# Patient Record
Sex: Female | Born: 1960 | Race: White | Hispanic: No | Marital: Married | Smoking: Former smoker
Health system: Southern US, Community
[De-identification: ages and names within clinical notes are randomized; demographics above are authoritative.]

## PROBLEM LIST (undated history)

## (undated) DIAGNOSIS — G8929 Other chronic pain: Secondary | ICD-10-CM

## (undated) DIAGNOSIS — I10 Essential (primary) hypertension: Secondary | ICD-10-CM

## (undated) DIAGNOSIS — M545 Low back pain, unspecified: Secondary | ICD-10-CM

## (undated) DIAGNOSIS — E785 Hyperlipidemia, unspecified: Secondary | ICD-10-CM

## (undated) DIAGNOSIS — M1990 Unspecified osteoarthritis, unspecified site: Secondary | ICD-10-CM

## (undated) DIAGNOSIS — I2510 Atherosclerotic heart disease of native coronary artery without angina pectoris: Secondary | ICD-10-CM

## (undated) DIAGNOSIS — E079 Disorder of thyroid, unspecified: Secondary | ICD-10-CM

## (undated) DIAGNOSIS — E559 Vitamin D deficiency, unspecified: Secondary | ICD-10-CM

## (undated) DIAGNOSIS — R51 Headache: Secondary | ICD-10-CM

## (undated) DIAGNOSIS — G4700 Insomnia, unspecified: Secondary | ICD-10-CM

## (undated) DIAGNOSIS — I272 Pulmonary hypertension, unspecified: Secondary | ICD-10-CM

## (undated) DIAGNOSIS — K219 Gastro-esophageal reflux disease without esophagitis: Secondary | ICD-10-CM

## (undated) DIAGNOSIS — R519 Headache, unspecified: Secondary | ICD-10-CM

## (undated) HISTORY — DX: Atherosclerotic heart disease of native coronary artery without angina pectoris: I25.10

## (undated) HISTORY — DX: Vitamin D deficiency, unspecified: E55.9

## (undated) HISTORY — DX: Headache, unspecified: R51.9

## (undated) HISTORY — DX: Other chronic pain: G89.29

## (undated) HISTORY — DX: Low back pain, unspecified: M54.50

## (undated) HISTORY — PX: CORONARY ANGIOPLASTY WITH STENT PLACEMENT: SHX49

## (undated) HISTORY — DX: Insomnia, unspecified: G47.00

## (undated) HISTORY — DX: Disorder of thyroid, unspecified: E07.9

## (undated) HISTORY — DX: Headache: R51

## (undated) HISTORY — DX: Pulmonary hypertension, unspecified: I27.20

## (undated) HISTORY — DX: Low back pain: M54.5

---

## 1998-09-22 HISTORY — PX: BILATERAL CARPAL TUNNEL RELEASE: SHX6508

## 1999-05-15 ENCOUNTER — Ambulatory Visit (HOSPITAL_COMMUNITY): Admission: RE | Admit: 1999-05-15 | Discharge: 1999-05-15 | Payer: Self-pay | Admitting: Family Medicine

## 1999-05-15 ENCOUNTER — Encounter: Payer: Self-pay | Admitting: Family Medicine

## 2001-04-14 ENCOUNTER — Other Ambulatory Visit: Admission: RE | Admit: 2001-04-14 | Discharge: 2001-04-14 | Payer: Self-pay | Admitting: Obstetrics and Gynecology

## 2001-04-23 ENCOUNTER — Encounter: Payer: Self-pay | Admitting: Obstetrics and Gynecology

## 2001-04-23 ENCOUNTER — Ambulatory Visit (HOSPITAL_COMMUNITY): Admission: RE | Admit: 2001-04-23 | Discharge: 2001-04-23 | Payer: Self-pay | Admitting: Obstetrics and Gynecology

## 2001-06-16 ENCOUNTER — Inpatient Hospital Stay (HOSPITAL_COMMUNITY): Admission: RE | Admit: 2001-06-16 | Discharge: 2001-06-18 | Payer: Self-pay | Admitting: Obstetrics and Gynecology

## 2001-06-16 ENCOUNTER — Encounter (INDEPENDENT_AMBULATORY_CARE_PROVIDER_SITE_OTHER): Payer: Self-pay

## 2004-10-18 ENCOUNTER — Inpatient Hospital Stay: Payer: Self-pay | Admitting: Cardiovascular Disease

## 2004-10-18 ENCOUNTER — Observation Stay (HOSPITAL_COMMUNITY): Admission: AD | Admit: 2004-10-18 | Discharge: 2004-10-19 | Payer: Self-pay | Admitting: Cardiology

## 2004-11-19 ENCOUNTER — Encounter: Payer: Self-pay | Admitting: Cardiovascular Disease

## 2004-12-25 ENCOUNTER — Inpatient Hospital Stay (HOSPITAL_COMMUNITY): Admission: EM | Admit: 2004-12-25 | Discharge: 2004-12-27 | Payer: Self-pay | Admitting: Emergency Medicine

## 2005-05-02 ENCOUNTER — Emergency Department (HOSPITAL_COMMUNITY): Admission: EM | Admit: 2005-05-02 | Discharge: 2005-05-03 | Payer: Self-pay | Admitting: Emergency Medicine

## 2007-01-07 ENCOUNTER — Ambulatory Visit (HOSPITAL_COMMUNITY): Admission: RE | Admit: 2007-01-07 | Discharge: 2007-01-08 | Payer: Self-pay | Admitting: Orthopedic Surgery

## 2007-02-21 ENCOUNTER — Emergency Department: Payer: Self-pay | Admitting: Unknown Physician Specialty

## 2007-04-02 ENCOUNTER — Ambulatory Visit: Payer: Self-pay | Admitting: Cardiovascular Disease

## 2007-04-08 ENCOUNTER — Other Ambulatory Visit: Payer: Self-pay

## 2007-04-08 ENCOUNTER — Inpatient Hospital Stay: Payer: Self-pay | Admitting: Cardiovascular Disease

## 2007-04-09 ENCOUNTER — Other Ambulatory Visit: Payer: Self-pay

## 2007-04-10 ENCOUNTER — Other Ambulatory Visit: Payer: Self-pay

## 2007-11-01 ENCOUNTER — Other Ambulatory Visit: Payer: Self-pay

## 2007-11-01 ENCOUNTER — Observation Stay: Payer: Self-pay | Admitting: Internal Medicine

## 2007-11-02 ENCOUNTER — Other Ambulatory Visit: Payer: Self-pay

## 2009-05-15 ENCOUNTER — Ambulatory Visit: Payer: Self-pay | Admitting: Cardiovascular Disease

## 2009-08-06 ENCOUNTER — Inpatient Hospital Stay (HOSPITAL_COMMUNITY): Admission: EM | Admit: 2009-08-06 | Discharge: 2009-08-07 | Payer: Self-pay | Admitting: Emergency Medicine

## 2010-06-11 ENCOUNTER — Ambulatory Visit: Payer: Self-pay | Admitting: Internal Medicine

## 2010-06-21 ENCOUNTER — Ambulatory Visit: Payer: Self-pay | Admitting: Emergency Medicine

## 2010-06-24 ENCOUNTER — Ambulatory Visit: Payer: Self-pay | Admitting: Emergency Medicine

## 2010-06-24 HISTORY — PX: BREAST EXCISIONAL BIOPSY: SUR124

## 2010-06-27 LAB — PATHOLOGY REPORT

## 2010-11-10 ENCOUNTER — Encounter: Payer: Self-pay | Admitting: Family Medicine

## 2010-11-10 ENCOUNTER — Ambulatory Visit (INDEPENDENT_AMBULATORY_CARE_PROVIDER_SITE_OTHER): Payer: BC Managed Care – PPO | Admitting: Family Medicine

## 2010-11-10 ENCOUNTER — Encounter (INDEPENDENT_AMBULATORY_CARE_PROVIDER_SITE_OTHER): Payer: Self-pay | Admitting: *Deleted

## 2010-11-10 DIAGNOSIS — M76899 Other specified enthesopathies of unspecified lower limb, excluding foot: Secondary | ICD-10-CM

## 2010-11-10 DIAGNOSIS — N390 Urinary tract infection, site not specified: Secondary | ICD-10-CM

## 2010-11-10 LAB — CONVERTED CEMR LAB
Bilirubin Urine: NEGATIVE
Glucose, Urine, Semiquant: NEGATIVE
Ketones, urine, test strip: NEGATIVE
WBC Urine, dipstick: NEGATIVE
pH: 7

## 2010-11-19 NOTE — Medication Information (Signed)
Summary: Tax adviser   Imported By: Dorna Leitz 11/11/2010 15:57:53  _____________________________________________________________________  External Attachment:    Type:   Image     Comment:   External Document

## 2010-11-19 NOTE — Progress Notes (Signed)
Summary: Office Visit  Office Visit   Imported By: Dorna Leitz 11/11/2010 15:56:45  _____________________________________________________________________  External Attachment:    Type:   Image     Comment:   External Document

## 2010-11-19 NOTE — Assessment & Plan Note (Signed)
Summary: BACK PAIN/EVW   Vital Signs:  Patient Profile:   50 Years Old Female CC:      Lower back pain Height:     62.75 inches Weight:      198 pounds Temp:     98.7 degrees F oral Pulse rate:   76 / minute Pulse rhythm:   regular Resp:     20 per minute BP sitting:   124 / 86  (right arm) Cuff size:   large  Vitals Entered By: Providence Crosby LPN (November 10, 2010 4:00 PM)                  Current Allergies: No known allergies History of Present Illness Chief Complaint: Lower back pain History of Present Illness: Awaken Thursday 11/07/2010 with lower back pain radiating to lower abdoman started drinking cranberry juice without any help. Complains of pressure of lower abd. with voiding  REVIEW OF SYSTEMS Constitutional Symptoms       Complains of fever, chills, and night sweats.     Denies weight loss, weight gain, and fatigue.  Eyes       Denies change in vision, eye pain, eye discharge, glasses, contact lenses, and eye surgery. Ear/Nose/Throat/Mouth       Complains of sinus problems.      Denies hearing loss/aids, change in hearing, ear pain, ear discharge, dizziness, frequent runny nose, frequent nose bleeds, sore throat, hoarseness, and tooth pain or bleeding.  Respiratory       Denies dry cough, productive cough, wheezing, shortness of breath, asthma, bronchitis, and emphysema/COPD.  Cardiovascular       Denies murmurs, chest pain, and tires easily with exhertion.    Gastrointestinal       Denies stomach pain, nausea/vomiting, diarrhea, constipation, blood in bowel movements, and indigestion. Genitourniary       Denies painful urination, kidney stones, and loss of urinary control.      Comments: lower back and lower abd. pain Neurological       Denies paralysis, seizures, and fainting/blackouts. Musculoskeletal       Denies muscle pain, joint pain, joint stiffness, decreased range of motion, redness, swelling, muscle weakness, and gout.  Skin       Denies  bruising, unusual mles/lumps or sores, and hair/skin or nail changes.  Psych       Denies mood changes, temper/anger issues, anxiety/stress, speech problems, depression, and sleep problems.  Family History: Father: deceased  Mother: alive with a history of cad Siblings:  2 brothers alive and well 1 sister alive and well Family History of CAD Female 1st degree relative <60 Family History High cholesterol  Social History: Married Assessment New Problems: LOWER BACK PAIN (ICD-724.2) BACK PAIN (ICD-724.5) URINARY TRACT INFECTION (ICD-599.0) FAMILY HISTORY OF CAD FEMALE 1ST DEGREE RELATIVE <60 (ICD-V16.49)   Plan New Orders: New Patient Level III [99203]  The patient and/or caregiver has been counseled thoroughly with regard to medications prescribed including dosage, schedule, interactions, rationale for use, and possible side effects and they verbalize understanding.  Diagnoses and expected course of recovery discussed and will return if not improved as expected or if the condition worsens. Patient and/or caregiver verbalized understanding.   Orders Added: 1)  New Patient Level III [99203]    Laboratory Results   Urine Tests  Date/Time Received: November 10, 2010 4:27 PM Date/Time Reported: November 10, 2010 4:27 PM  Routine Urinalysis   Color: yellow Appearance: Cloudy Glucose: negative   (Normal Range: Negative) Bilirubin: negative   (  Normal Range: Negative) Ketone: negative   (Normal Range: Negative) Spec. Gravity: 1.020   (Normal Range: 1.003-1.035) Blood: negative   (Normal Range: Negative) pH: 7.0   (Normal Range: 5.0-8.0) Protein: negative   (Normal Range: Negative) Urobilinogen: 0.2   (Normal Range: 0-1) Nitrite: negative   (Normal Range: Negative) Leukocyte Esterace: negative   (Normal Range: Negative)

## 2010-12-25 LAB — COMPREHENSIVE METABOLIC PANEL
ALT: 24 U/L (ref 0–35)
Albumin: 3.9 g/dL (ref 3.5–5.2)
Alkaline Phosphatase: 69 U/L (ref 39–117)
BUN: 8 mg/dL (ref 6–23)
CO2: 26 mEq/L (ref 19–32)
Calcium: 9.4 mg/dL (ref 8.4–10.5)
Chloride: 109 mEq/L (ref 96–112)
Creatinine, Ser: 0.65 mg/dL (ref 0.4–1.2)
GFR calc non Af Amer: >60 mL/min (ref >60)
Sodium: 143 mEq/L (ref 135–145)
Total Bilirubin: 0.5 mg/dL (ref 0.3–1.2)
Total Protein: 7 g/dL (ref 6.0–8.3)

## 2010-12-25 LAB — CBC
HCT: 36.6 % (ref 36.0–46.0)
Hemoglobin: 12 g/dL (ref 12.0–15.0)
MCV: 81.8 fL (ref 78.0–100.0)
MCV: 81.5 fL (ref 78.0–100.0)
RBC: 4.28 MIL/uL (ref 3.87–5.11)
RBC: 4.45 MIL/uL (ref 3.87–5.11)
WBC: 6.5 10*3/uL (ref 4.0–10.5)
WBC: 5.7 10*3/uL (ref 4.0–10.5)

## 2010-12-25 LAB — CARDIAC PANEL(CRET KIN+CKTOT+MB+TROPI)
CK, MB: 1.4 ng/mL (ref 0.3–4.0)
Troponin I: 0.01 ng/mL (ref 0.00–0.06)
Troponin I: 0.01 ng/mL (ref 0.00–0.06)
Troponin I: 0.1 ng/mL — ABNORMAL HIGH (ref 0.00–0.06)

## 2010-12-25 LAB — DIFFERENTIAL
Basophils Absolute: 0 10*3/uL (ref 0.0–0.1)
Basophils Relative: 1 % (ref 0–1)
Monocytes Absolute: 0.4 10*3/uL (ref 0.1–1.0)
Neutrophils Relative %: 51 % (ref 43–77)

## 2010-12-25 LAB — BASIC METABOLIC PANEL
Calcium: 9.2 mg/dL (ref 8.4–10.5)
Creatinine, Ser: 0.68 mg/dL (ref 0.4–1.2)
GFR calc non Af Amer: >60 mL/min (ref >60)
Potassium: 3.6 mEq/L (ref 3.5–5.1)

## 2010-12-25 LAB — POCT CARDIAC MARKERS
CKMB, poc: <1 ng/mL — ABNORMAL LOW (ref 1.0–8.0)
Myoglobin, poc: 51.2 ng/mL (ref 12–200)
Myoglobin, poc: 68.1 ng/mL (ref 12–200)
Troponin i, poc: <0.05 ng/mL (ref 0.00–0.09)

## 2010-12-25 LAB — LIPID PANEL
HDL: 39 mg/dL — ABNORMAL LOW (ref >39)
LDL Cholesterol: 69 mg/dL (ref 0–99)
Total CHOL/HDL Ratio: 4.1 RATIO
Triglycerides: 259 mg/dL — ABNORMAL HIGH (ref <150)

## 2010-12-25 LAB — HEPARIN LEVEL (UNFRACTIONATED): Heparin Unfractionated: 0.45 IU/mL (ref 0.30–0.70)

## 2010-12-25 LAB — PROTIME-INR
INR: 1 (ref 0.00–1.49)
Prothrombin Time: 13.1 seconds (ref 11.6–15.2)

## 2010-12-25 LAB — BRAIN NATRIURETIC PEPTIDE: Pro B Natriuretic peptide (BNP): <30 pg/mL (ref 0.0–100.0)

## 2011-01-18 ENCOUNTER — Observation Stay: Payer: Self-pay | Admitting: Internal Medicine

## 2011-02-07 NOTE — H&P (Signed)
[facility]  Patient:    Christina Zamora, Christina Zamora Visit Number: [id number] MRN: [id number]          Service Type: Attending:  Maris Berger. Pennie Rushing, M.D. Dictated by:   Henreitta Leber, P.A.                           History and Physical  DATE OF BIRTH:  [birth year]  HISTORY OF PRESENT ILLNESS:  The patient is a 50 year old married white female, para 3-0-5-3 with a one-year history of irregular uterine bleeding, menorrhagia, dysmenorrhea and pelvic pain.  Pelvic ultrasound of April 23, 2001 revealed a heterogeneous echogenicity of uterine myometrium, but no discrete myometrial masses (? adenomyosis).  The patient begins to experience severe abdominal cramping one week prior to her menses, which she describes as being more severe than actual menstrual cramps.  Her pain is managed, though not alleviated, with ibuprofen 800 mg and Vicodin.  The patients six-day menstrual flow is heavy with clots, causing her to change two overnight pads every two hours and, on occasion, causing her to soil her clothes.  The patient had a normal TSH and a negative Gonorrhea and Chlamydia culture in August 2002.  After considering medical and surgical options for managing her symptoms, the patient has opted for hysterectomy.  PAST OBSTETRIC HISTORY:       Gravida 8, para 3-0-5-3; The patient experienced preterm labor during her pregnancies; she is Rh negative.  PAST GYNECOLOGIC HISTORY:     Menarche at the age of 75.  MENSTRUAL HISTORY:            See the history of present illness.  She uses bilateral tubal ligation for contraception.  The patient has as remote history of Gonorrhea.  She had a normal mammogram and Pap smear in August 2002.  PAST MEDICAL HISTORY:         Positive for bone cancer at the age of 49. Migraine headaches.  Hypertension.  Anemia.  PAST SURGICAL HISTORY:        Left femoral surgery for cancer at the age of 4.  Cholecystectomy in 1997.  Bilateral tubal  ligation.  The patient denies blood transfusion and states she has no problems with anesthesia.  FAMILY HISTORY:               Positive for cardiovascular disease, hypertension and osteoporosis.  SOCIAL HISTORY:               The patient is married and works as a Teacher, early years/pre.  HABITS:                       The patient smokes 1-1/2 packs per day of cigarettes.  She does not use alcohol.  CURRENT MEDICATIONS:          HCTZ 25 mg q.d.  Protonix 40 mg q.d.  Chromagen, one tablet q.d.  Ibuprofen 800 mg, one q.6h. p.r.n. pain.  ALLERGIES:                    No known drug allergies.  REVIEW OF SYSTEMS:            Negative with the exception of those mentioned in the history of present illness.  PHYSICAL EXAMINATION:  VITAL SIGNS:                  Blood pressure 120/80, weight 238-1/2 lb, height 5 ft 3-3/4  in.  HEENT:                        Within normal limits.  NECK:                         The thyroid is not enlarged.  HEART:                        Regular rate and rhythm without murmur.  LUNGS:                        No wheezes, rales or rhonchi.  BACK:                         No CVA tenderness.  ABDOMEN:                      Bowel sounds are present.  Soft and nontender. No hepatosplenomegaly.  EXTREMITIES:                  No clubbing, cyanosis, or edema.  PELVIC:                       EG/BUS within normal limits.  The vagina is rugose.  The cervix is nontender without lesions.  The uterus is difficult to assessment secondary to body habitus.  Adnexa without any palpable masses or tenderness.  Rectovaginal without masses or tenderness.  IMPRESSION:                   1. Abnormal uterine bleeding.                               2. Menorrhagia.                               3. Pelvic pain.                               4. Anemia.  DISPOSITION:                  A discussion was held with the patient regarding the management options, both medical and surgical, for  her presenting symptoms and radiologic findings.  The patient has opted to undergo a hysterectomy. The implications for this procedure along with the risks associated which include but are not limited to reaction to anesthesia, damage to adjacent organs, infection and bleeding were discussed, and the patient accepts those risks.  The patient is schedule to undergo a total vaginal hysterectomy with the possibility of a total abdominal hysterectomy at [facility] on June 16, 2001 at 7:30 a.m. Dictated by:   Henreitta Leber, P.A. Attending:  Maris Berger. Pennie Rushing, M.D. DD:  06/03/01 TD:  06/03/01 Job: [id number]

## 2011-02-07 NOTE — Op Note (Signed)
[facility]  Patient:    Christina Zamora, Christina Zamora Visit Number: [id number] MRN: [id number]          Service Type: GYN Location: 9300 [id number] Attending Physician:  Shaune Spittle Dictated by:   Maris Berger. Pennie Rushing, M.D. Proc. Date: 06/16/01 Admit Date:  06/16/2001                             Operative Report  PREOPERATIVE DIAGNOSES:         1. Abnormal uterine bleeding.                                 2. Menorrhagia.                                 3. Pelvic pain.                                 4. Anemia history.                                 5. Rule out adenomyosis.  POSTOPERATIVE DIAGNOSES:        1.  Abnormal uterine bleeding.                                 2.  Menorrhagia.                                 3.  Pelvic pain.                                 4.  Anemia history.                                 5.  Rule out adenomyosis.  OPERATION:                      Total vaginal hysterectomy, McCall culdoplasty.  SURGEON:                        Vanessa P. Pennie Rushing, M.D.  FIRST ASSISTANT:                Henreitta Leber, P.A.  ANESTHESIA:                     General orotracheal.  ESTIMATED BLOOD LOSS:           250 cc.  COMPLICATIONS:                  None.  FINDINGS:                       The uterus was upper limits of normal size. The ovaries and tubes were apparently normal.  DESCRIPTION OF PROCEDURE:       The patient was taken to the operating room after appropriate identification and placed on the  operating table.  After the attainment of adequate general anesthesia she was placed in a lithotomy position.  The perineum and vagina were prepped with multiple layers of Betadine and a Foley catheter inserted into the bladder and connected to straight drainage.  The perineum was draped as a sterile field.  A weighted speculum was placed in the posterior vagina and Lahey tenaculum was placed on the anterior and posterior lips of the  cervix.  The cervical vaginal mucosa was injected with a dilute solution of Pitressin.  The cervical vaginal junction was incised and the anterior vaginal mucosa dissected off the anterior cervix.  The posterior peritoneum was entered and uterosacral ligaments on the right and left side, clamped, cut and suture ligated, but those sutures held.  The paracervical tissues were then clamped, cut and suture ligated.  The anterior peritoneum was then entered and the parametrial tissues ware clamped, cut and suture ligated.  Uterus was inverted and the upper pedicles clamped, cut, tied with a free tie and then suture ligated and those sutures held.  The uterus was removed from the operative field. Hemostatic sutures were placed in the left vaginal cuff and pelvic side wall to achieve adequate hemostasis.  A McCall culdoplasty suture was placed in the posterior cul-de-sac incorporating the uterosacral ligaments and the intervening peritoneum.  The sutures which had been held on the uterosacral ligaments were then used to create the vaginal angles and tied down.  The remainder of the vaginal cuff was closed in a single layer incorporating the anterior vaginal mucosa, anterior peritoneum, posterior peritoneum and posterior vaginal mucosa in figure-of-eight sutures.  All sutures used for this procedure were 0 Vicryl.  The Mccall culdoplasty suture was tied down and vaginal packing placed in the vagina.  Hemostasis was noted to be adequate. The patient tolerated the procedure well.  She was awakened from general anesthesia after removal of all instruments with the packing in place.  She was taken to the recovery room in satisfactory condition, having tolerated the procedure well with sponge and instrument counts correct. Dictated by:   Maris Berger. Pennie Rushing, M.D. Attending Physician:  Shaune Spittle DD:  06/16/01 TD:  06/16/01 Job: [id number]

## 2011-02-07 NOTE — Cardiovascular Report (Signed)
NAMERUDEAN, ICENHOUR              ACCOUNT NO.:  [id number]   MEDICAL RECORD NO.:  [id number]          PATIENT TYPE:  INP   LOCATION:  6524                         FACILITY:  [facility]   PHYSICIAN:  Mohan N. Sharyn Lull, M.D. DATE OF BIRTH:  [birth year]   DATE OF PROCEDURE:  10/18/2004  DATE OF DISCHARGE:  10/19/2004                              CARDIAC CATHETERIZATION     As Dr. [id number] which antigen again a dictating operative report on the stent  and then G A N D A N A Nada Libman I A her medical record number [id number]   DATE OF PROCEDURE:  10/18/2004   PROCEDURE:  1.  Successful PTCA to mid-RCA using 2.5 x 15 mm long Voyager balloon.  2.  Successful deployment of 3.0 x 28 mm long Cypher drug-eluting stent in      mid-RCA.  3.  Successful PTCA to ostial acute marginal and using 2.0 x 8 mm long      Voyager balloon.   INDICATIONS FOR PROCEDURE:  Ms. Koors is a 50 year old white female with  past medical history significant for hypertension, hypothyroidism, morbid  obesity, history of tobacco abuse, positive family history of coronary  artery disease.  She is transferred [facility] for PCI to  RCA. The patient complains of retrosternal chest pain described as pressure  radiating to left arm off and on for last six weeks and this a.m. around  3:00 a.m. woke up with chest pain radiating to left arm associated with  nausea, diaphoresis, mild shortness of breath, and palpitation. The patient  states she had similar pain approximately three years ago and had stress  Myoview which was negative. Due to typical anginal chest pain, multiple risk  factors, the patient underwent left catheter at [facility]  today which showed good LV systolic function. Left main was short which was  patent. LAD has 15-20% mid and distal stenosis. Ramus was very small which  was patent.  Left circumflex was large which was patent. OM1 and OM2 were  very small.  OM3 and OM4 very large  which were patent. RCA has 75-85%  sequential mid-stenosis at the bifurcation with the acute marginal.  Acute  marginal had ostial 70-75% stenosis. The patient has codominant coronary  system. The patient is transferred to [facility] for PCI to RCA.   PAST MEDICAL HISTORY:  As above.   PAST SURGICAL HISTORY:  She had hysterectomy in the past, cholecystectomy in  the past and left hip surgery in the past.   ALLERGIES:  NO KNOWN DRUG ALLERGIES.   MEDICATION:  On Maxzide and Synthroid 250 mcg daily.   SOCIAL HISTORY:  She is married and has three children.  Smoked one pack per  day for 25 years, quit 3 years ago. No history of alcohol abuse. 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   FAMILY HISTORY:  Father died of MI at the age of 17. Mother had CVA of  coronary artery disease treated medically. She has two brothers and one  sister.   PHYSICAL EXAMINATION:  GENERAL:  On examination she is alert and oriented x  3 in no acute distress.  VITAL SIGNS:  Blood pressure was 96/58, pulse was 72 regular.  HEENT:  Conjunctivae is pink.  NECK: Supple. No JVD, no bruit.  LUNGS:  Clear to auscultation anterolaterally.  CARDIOVASCULAR:  S1 and S2 was soft. There was no S3 gallop.  ABDOMEN:  Was soft. Bowel sounds were present, nontender.  EXTREMITIES: There is no clubbing, cyanosis or edema. Right groin. There was  no hematoma or oozing.   IMPRESSION:  Was unstable angina status post left catheter at [facility] with hypertension, hypothyroidism, morbid obesity, history  of tobacco abuse, positive family history of coronary artery disease.  Discussed with the patient regarding PTCA stenting to RCA, its risks and  benefits; i.e. death, MI, stroke, need for emergency CABG, risk of  restenosis,  localized complications, risk of occlusion of small acute  marginal branch leading to a small MI except.  Consented for PCI.   DESCRIPTION OF PROCEDURE:  After obtaining  informed consent, the patient was  brought to the cath lab and was placed on fluoroscopy table. The right groin  was prepped and draped in usual fashion.  Then 2% Xylocaine was used for  local anesthesia in the right groin.  A 5-French sheath was exchanged over  the wire to 7-French sheath without difficulty. Next, 7-French JL-4 and JR-4  Judkins catheter was advanced over the wire under fluoroscopic guidance up  to the ascending aorta.  Wire was pulled out.  The catheter was aspirated  and connected to the manifold. Catheter was further advanced and engaged  into right coronary ostium.  Multiple views of the right system were taken.   FINDINGS:  RCA had sequential mid-70-85% stenosis at the bifurcation with  acute marginal.  Acute marginal had ostial 75-80% stenosis.   INTERVENTIONAL PROCEDURE:  Successful PTCA to mid RCA was done using double  wire technique using 2.5 x 15 mm long Voyager balloon for predilatation in  mid-RCA.  Angiogram showed 70-75% mid-ostial acute marginal stenosis with  haziness and then successful PTCA to ostial acute marginal.  Then using 2.0  x 8 mm long Voyager balloon, the region was dilated from 75-85% to less than  20% distal with elastic recoil at the end of the procedure up to 30%  residual with TIMI grade III distal flow without evidence of dissection or  distal embolization. And then 3.0 x 28 mm long Cypher drug-eluting stent was  deployed in mid-RCA at 10 atmospheres of pressure which was fully expanded  going up to 15 atmosphere of pressure.  Lesion was dilated from 75% and 85%  sequential lesion to 0% residual with excellent TIMI grade 3 distal flow  without evidence of dissection or distal embolization. The patient received  weight-based heparin, Integrilin, and total of 600 mg of Plavix during the  procedure. The patient tolerated procedure well. There are no complications. The patient was transferred to recovery room in stable  condition with  completes the operative report on this we then G A L V A Vernell Leep her medical record number is [id number] presyncope to cath lab to my  mailbox and to Dr. Lambert Mody of [place] [facility] at [facility]  [facility] thank you      MNH/MEDQ  D:  10/18/2004  T:  10/19/2004  Job:  [id number]   cc:   Catheter Lab   Shelah Lewandowsky, M.D.  [facility]  [facility]

## 2011-02-07 NOTE — Discharge Summary (Signed)
[facility]  Patient:    Christina Zamora, Christina Zamora Visit Number: [id number] MRN: [id number]          Service Type: GYN Location: 9300 [id number] Attending Physician:  Shaune Spittle Dictated by:   Henreitta Leber, P.A. Admit Date:  06/16/2001 Discharge Date: 06/18/2001                             Discharge Summary  DISCHARGE DIAGNOSES:          1. Abnormal uterine bleeding.                               2. Menorrhagia.                               3. Pelvic pain.                               4. Anemia.  PROCEDURES:                   On the day of admission the patient underwent a total vaginal hysterectomy with Rogelio Seen culdoplasty, tolerating all procedures well.  HISTORY OF PRESENT ILLNESS:   Christina Zamora is a 50 year old married white female, para 3-0-5-3, with a one-year history of irregular uterine bleeding, menorrhagia, dysmenorrhea, and pelvic pain, who presents for a total vaginal hysterectomy.  Please see dictated history and physical exam for details.  PHYSICAL EXAMINATION:  VITAL SIGNS:                  Blood pressure 120/80.  Weight 238-1/2 pounds.                               Height 5 feet, 3-3/4 inches tall.  GENERAL:                      Within normal limits.  PELVIC:                       EGBUS is within normal limits.  Vagina is rugose.  The cervix is nontender, without lesions.  The uterus is difficult to assess secondary to body habitus.  Adnexa without palpable masses or tenderness.  RECTOVAGINAL:                 Without masses or tenderness.  HOSPITAL COURSE:              On the date of admission the patient underwent a total vaginal hysterectomy with a McCall culdoplasty, tolerating the procedure well.  Postoperative course was somewhat slow due to patient experiencing significant nausea and vomiting on postoperative day #1.  However, after a change in PCA analgesia the patient was able to resolve this problem  and progress.  Postoperative hemoglobin 10.4 (preoperative hemoglobin 13.4).  By postoperative day #2 the patient was able to resume bowel and bladder function and deemed ready for discharge home.  DISCHARGE MEDICATIONS:        1. Tylox 1-2 every four to six hours as needed  for pain.                               2. Ibuprofen 600 mg 1 tablet every six hours                                  with food for five days, then as needed.                               3. Phenergan 25 mg 1 tablet four times daily if                                  needed for nausea.                               4. Iron 325 mg 1 tablet twice daily for six                                  weeks.                               5. Protonix 40 mg 1 tablet daily.                               6. HCTZ 25 mg 1 tablet daily.  FOLLOW-UP:                    The patient is to follow up with Dr. Pennie Rushing for six-week postoperative exam on July 28, 2001, at 11 a.m.  DISCHARGE INSTRUCTIONS:       The patient was given a copy of [facility] postoperative instruction sheet.  She was further advised to avoid driving for two weeks, heavy lifting for four weeks, and intercourse for six weeks.  PATHOLOGY:                    The patients final pathology was not available at the time of discharge. Dictated by:   Henreitta Leber, P.A. Attending Physician:  Shaune Spittle DD:  06/18/01 TD:  06/18/01 Job: [id number]

## 2011-02-07 NOTE — Op Note (Signed)
[name], Zamora              ACCOUNT NO.:  [id number]   MEDICAL RECORD NO.:  [id number]          PATIENT TYPE:  AMB   LOCATION:  DAY                          FACILITY:  [facility]   PHYSICIAN:  Madlyn Frankel. Charlann Boxer, M.D.  DATE OF BIRTH:  02/23/1961   DATE OF PROCEDURE:  01/07/2007  DATE OF DISCHARGE:                               OPERATIVE REPORT   PREOPERATIVE DIAGNOSIS:  Left knee medial meniscal tear.   POSTOPERATIVE DIAGNOSIS/FINDINGS:  1. Extensive tear into to the posterior horn mid body area of the      medial meniscus.  2. A large area of chondral defect down to eburnated bone. No evidence      of avascular necrosis, but a large chondral defect measuring      approximately 30 mm in diameter, medial femoral condyle, tibial      plateau intact.  3. Discoid lateral meniscus with tear into the anterior horn midbody      region.  4. Hypertrophic, hyperemic synovitis.   PROCEDURES:  1. Left knee diagnostic and operative arthroscopy with a subtotal      medial meniscectomy.  2. Lateral partial meniscectomy and debridement of a discoid meniscus.  3. Abrasion chondroplasty to bleeding bone of the medial femoral      condyle, large defect.  No synovectomy carried out, due to      extensive hyperemia.   SURGEON:  Madlyn Frankel. Charlann Boxer, M.D.   ASSISTANT:  None.   ANESTHESIA:  General plus local.   BLOOD LOSS:  None.   COMPLICATIONS:  None.   INDICATIONS FOR THE PROCEDURE:  Christina Zamora is a 50 year old female  unfortunately with some persistent left knee pain that was mechanical in  nature.  MRI had indicated meniscal pathology.  She had failed  conservative measures including injections, anti-inflammatories; and  wished to proceed with arthroscopic surgery as the conservative surgical  option.  I reviewed the risks and benefits of meniscectomy.  At the time  of discussion I did not anticipate the large chondral defect that was  present.  Will need to review this with her postop.   Nonetheless, a  consent was obtained after reviewing risks and benefits given her  previous cardiac history.  Consent obtained.   PROCEDURE IN DETAIL:  The patient was brought to the operative theater.  Once adequate anesthesia and preoperative antibiotics given, 1 gm of  Ancef was administered; the patient was positioned supine with the left  leg in the leg holder.  The left lower extremity was prepped and draped  in the standard usual fashion from the ankle to the leg holder.  Standard inferior medial, superior medial, and inferior lateral portals  were utilized.  Diagnostic evaluation of the knee was carried out.  Extensive hyperemic synovitis was carried out; where it was noted.  Because of this, decision was made for no synovitis and no lateral  release due to her Plavix use; and potential excessive bleeding and  postoperative discomfort.   Attention was directed, anteriorly, initially noting that the  patellofemoral joint was relatively intact.  Medial compartment was  identified and noted to  have extensive tear in the medial meniscus.  Then 35 shaver and biting baskets were used to debride this back to  stable levels.  It was noted, at this point, that initially there was a  small defect in the medial femoral condyle.  It turns out this was a  very large chondral defect with undermining.  It measured about 30 mm in  diameter.  I debrided this back to expose bone initially; and then later  abraded this down to some bleeding punctate bone to try to stimulate  fibrocartilage growth.  There was some pretty well-established border of  cartilage on the more anterior aspect.  The posterior had thinned out  quite a bit.  There was a kissing lesion on the medial aspect of the  tibial plateau.  This was not abraded do to concerns for subchondral  edema, and stress reaction in this area.   Laterally she was noted to have a discoid meniscus with some tearing to  the antral horn, and  midbody areas.  I used a biting basket and a 35  shaver to take care of this.  She had a small area of defect on the  femoral condyle, laterally, but this was not extensive; but I did  debride with a bit of cartilage off of here.  Following this, I  reexamined the knee to make sure that we there was satisfied with the  condition of the knee.  Once that was done; and there were no loose  fragments present, and the present instrumentation was removed.  I then  reapproximated the portal sites using a 4-0 nylon, and injected the knee  with 1/4% Marcaine with epinephrine and 80 mg the Depo-Medrol in an  effort to try to calm the knee down due to the significant inflammation  noted.   She was then dressed into a sterile bulky wrap with three ABDs to help  compression.  She was then extubated and brought to the recovery room in  stable condition.   POSTOP PLAN:  She will need to be limited in her weightbearing for at  least 4 weeks.  We will discuss this with her on followup.  I would like  for her to be on crutches or walker until that time.  Therapy will be  helpful; and range of motion and strengthening questions will be  encouraged, answered, and reviewed.      Madlyn Frankel Charlann Boxer, M.D.  Electronically Signed     MDO/MEDQ  D:  01/07/2007  T:  01/07/2007  Job:  [id number]

## 2013-02-17 ENCOUNTER — Observation Stay: Payer: Self-pay | Admitting: Internal Medicine

## 2013-02-17 LAB — CBC
HGB: 12.8 g/dL (ref 12.0–16.0)
MCH: 27.1 pg (ref 26.0–34.0)
MCHC: 34.6 g/dL (ref 32.0–36.0)
MCV: 78 fL — ABNORMAL LOW (ref 80–100)
RBC: 4.74 10*6/uL (ref 3.80–5.20)

## 2013-02-17 LAB — URINALYSIS, COMPLETE
Bilirubin,UR: NEGATIVE
Blood: NEGATIVE
Ketone: NEGATIVE
Leukocyte Esterase: NEGATIVE
Nitrite: NEGATIVE
Ph: 5 (ref 4.5–8.0)
RBC,UR: 1 /HPF (ref 0–5)
WBC UR: 4 /HPF (ref 0–5)

## 2013-02-17 LAB — BASIC METABOLIC PANEL
Chloride: 106 mmol/L (ref 98–107)
Creatinine: 0.67 mg/dL (ref 0.60–1.30)
EGFR (African American): >60
Glucose: 116 mg/dL — ABNORMAL HIGH (ref 65–99)
Osmolality: 280 (ref 275–301)
Potassium: 3.5 mmol/L (ref 3.5–5.1)

## 2013-02-17 LAB — TROPONIN I: Troponin-I: <0.02 ng/mL

## 2013-02-17 LAB — CK TOTAL AND CKMB (NOT AT ARMC)
CK, Total: 64 U/L (ref 21–215)
CK-MB: <0.5 ng/mL — ABNORMAL LOW (ref 0.5–3.6)
CK-MB: <0.5 ng/mL — ABNORMAL LOW (ref 0.5–3.6)
CK-MB: 0.5 ng/mL (ref 0.5–3.6)

## 2013-02-17 LAB — PRO B NATRIURETIC PEPTIDE: B-Type Natriuretic Peptide: 77 pg/mL (ref 0–125)

## 2013-02-18 LAB — BASIC METABOLIC PANEL
Anion Gap: 4 — ABNORMAL LOW (ref 7–16)
BUN: 16 mg/dL (ref 7–18)
Calcium, Total: 8.9 mg/dL (ref 8.5–10.1)
Chloride: 106 mmol/L (ref 98–107)
EGFR (African American): >60
Glucose: 78 mg/dL (ref 65–99)
Osmolality: 279 (ref 275–301)
Potassium: 3.4 mmol/L — ABNORMAL LOW (ref 3.5–5.1)
Sodium: 140 mmol/L (ref 136–145)

## 2013-02-18 LAB — CBC WITH DIFFERENTIAL/PLATELET
Basophil #: 0 10*3/uL (ref 0.0–0.1)
Eosinophil #: 0.2 10*3/uL (ref 0.0–0.7)
Eosinophil %: 2.3 %
HCT: 34.6 % — ABNORMAL LOW (ref 35.0–47.0)
Lymphocyte #: 1.5 10*3/uL (ref 1.0–3.6)
MCHC: 34.6 g/dL (ref 32.0–36.0)
MCV: 80 fL (ref 80–100)
Monocyte #: 0.5 x10 3/mm (ref 0.2–0.9)
Monocyte %: 6.4 %
Platelet: 231 10*3/uL (ref 150–440)
RBC: 4.35 10*6/uL (ref 3.80–5.20)
RDW: 14.8 % — ABNORMAL HIGH (ref 11.5–14.5)

## 2013-02-18 LAB — LIPID PANEL
Cholesterol: 161 mg/dL (ref 0–200)
HDL Cholesterol: 41 mg/dL (ref 40–60)
Triglycerides: 161 mg/dL (ref 0–200)
VLDL Cholesterol, Calc: 32 mg/dL (ref 5–40)

## 2013-03-30 ENCOUNTER — Observation Stay: Payer: Self-pay | Admitting: Internal Medicine

## 2013-03-30 LAB — TROPONIN I
Troponin-I: <0.02 ng/mL
Troponin-I: <0.02 ng/mL
Troponin-I: <0.02 ng/mL

## 2013-03-30 LAB — CBC
HGB: 12.2 g/dL (ref 12.0–16.0)
MCH: 27.2 pg (ref 26.0–34.0)
MCV: 81 fL (ref 80–100)
Platelet: 263 10*3/uL (ref 150–440)
RBC: 4.47 10*6/uL (ref 3.80–5.20)
WBC: 6.8 10*3/uL (ref 3.6–11.0)

## 2013-03-30 LAB — BASIC METABOLIC PANEL
BUN: 15 mg/dL (ref 7–18)
Calcium, Total: 8.8 mg/dL (ref 8.5–10.1)
Co2: 25 mmol/L (ref 21–32)
Creatinine: 0.95 mg/dL (ref 0.60–1.30)
EGFR (Non-African Amer.): >60
Osmolality: 286 (ref 275–301)
Potassium: 3.6 mmol/L (ref 3.5–5.1)
Sodium: 143 mmol/L (ref 136–145)

## 2013-03-31 LAB — BASIC METABOLIC PANEL
Anion Gap: 4 — ABNORMAL LOW (ref 7–16)
Calcium, Total: 8.6 mg/dL (ref 8.5–10.1)
Creatinine: 0.93 mg/dL (ref 0.60–1.30)
EGFR (African American): >60
EGFR (Non-African Amer.): >60
Osmolality: 277 (ref 275–301)
Sodium: 138 mmol/L (ref 136–145)

## 2013-03-31 LAB — CBC WITH DIFFERENTIAL/PLATELET
Eosinophil %: 4.4 %
HGB: 12.4 g/dL (ref 12.0–16.0)
Lymphocyte %: 37.7 %
MCH: 27.4 pg (ref 26.0–34.0)
MCHC: 34.1 g/dL (ref 32.0–36.0)
MCV: 80 fL (ref 80–100)
Monocyte %: 6 %
Neutrophil #: 3.2 10*3/uL (ref 1.4–6.5)
Platelet: 258 10*3/uL (ref 150–440)
RBC: 4.54 10*6/uL (ref 3.80–5.20)
RDW: 15.3 % — ABNORMAL HIGH (ref 11.5–14.5)
WBC: 6.2 10*3/uL (ref 3.6–11.0)

## 2013-03-31 LAB — LIPID PANEL
Cholesterol: 222 mg/dL — ABNORMAL HIGH (ref 0–200)
HDL Cholesterol: 45 mg/dL (ref 40–60)
Triglycerides: 198 mg/dL (ref 0–200)
VLDL Cholesterol, Calc: 40 mg/dL (ref 5–40)

## 2013-03-31 LAB — APTT: Activated PTT: 96 secs — ABNORMAL HIGH (ref 23.6–35.9)

## 2013-09-22 HISTORY — PX: REPLACEMENT TOTAL KNEE: SUR1224

## 2014-02-18 ENCOUNTER — Emergency Department: Payer: Self-pay | Admitting: Emergency Medicine

## 2014-02-18 LAB — BASIC METABOLIC PANEL
ANION GAP: 6 — AB (ref 7–16)
BUN: 21 mg/dL — AB (ref 7–18)
CHLORIDE: 106 mmol/L (ref 98–107)
Calcium, Total: 9 mg/dL (ref 8.5–10.1)
Co2: 29 mmol/L (ref 21–32)
Creatinine: 0.68 mg/dL (ref 0.60–1.30)
EGFR (African American): >60
EGFR (Non-African Amer.): >60
Glucose: 81 mg/dL (ref 65–99)
OSMOLALITY: 283 (ref 275–301)
Potassium: 3.5 mmol/L (ref 3.5–5.1)
Sodium: 141 mmol/L (ref 136–145)

## 2014-02-18 LAB — CBC
HCT: 38 % (ref 35.0–47.0)
HGB: 12.4 g/dL (ref 12.0–16.0)
MCH: 27.4 pg (ref 26.0–34.0)
MCHC: 32.5 g/dL (ref 32.0–36.0)
MCV: 84 fL (ref 80–100)
Platelet: 232 10*3/uL (ref 150–440)
RBC: 4.51 10*6/uL (ref 3.80–5.20)
RDW: 14.9 % — AB (ref 11.5–14.5)
WBC: 7.7 10*3/uL (ref 3.6–11.0)

## 2014-02-18 LAB — PROTIME-INR
INR: 1
Prothrombin Time: 12.9 secs (ref 11.5–14.7)

## 2014-02-18 LAB — PRO B NATRIURETIC PEPTIDE: B-Type Natriuretic Peptide: 51 pg/mL (ref 0–125)

## 2014-02-18 LAB — TROPONIN I

## 2014-02-19 LAB — TROPONIN I

## 2014-08-30 ENCOUNTER — Ambulatory Visit: Payer: Self-pay | Admitting: Orthopedic Surgery

## 2014-08-30 LAB — CBC
HCT: 40.7 % (ref 35.0–47.0)
HGB: 13.1 g/dL (ref 12.0–16.0)
MCH: 27.3 pg (ref 26.0–34.0)
MCHC: 32.2 g/dL (ref 32.0–36.0)
MCV: 85 fL (ref 80–100)
Platelet: 266 10*3/uL (ref 150–440)
RBC: 4.8 10*6/uL (ref 3.80–5.20)
RDW: 15 % — ABNORMAL HIGH (ref 11.5–14.5)
WBC: 6.1 10*3/uL (ref 3.6–11.0)

## 2014-08-30 LAB — PROTIME-INR
INR: 0.9
Prothrombin Time: 12.5 secs (ref 11.5–14.7)

## 2014-08-30 LAB — URINALYSIS, COMPLETE
BACTERIA: NONE SEEN
BLOOD: NEGATIVE
Bilirubin,UR: NEGATIVE
Glucose,UR: NEGATIVE mg/dL (ref 0–75)
Ketone: NEGATIVE
Nitrite: NEGATIVE
PH: 6 (ref 4.5–8.0)
PROTEIN: NEGATIVE
RBC,UR: 2 /HPF (ref 0–5)
SPECIFIC GRAVITY: 1.017 (ref 1.003–1.030)
WBC UR: 3 /HPF (ref 0–5)

## 2014-08-30 LAB — BASIC METABOLIC PANEL
Anion Gap: 9 (ref 7–16)
BUN: 10 mg/dL (ref 7–18)
CREATININE: 0.62 mg/dL (ref 0.60–1.30)
Calcium, Total: 9.5 mg/dL (ref 8.5–10.1)
Chloride: 106 mmol/L (ref 98–107)
Co2: 27 mmol/L (ref 21–32)
EGFR (African American): >60
EGFR (Non-African Amer.): >60
Glucose: 80 mg/dL (ref 65–99)
OSMOLALITY: 281 (ref 275–301)
Potassium: 3.7 mmol/L (ref 3.5–5.1)
Sodium: 142 mmol/L (ref 136–145)

## 2014-08-30 LAB — APTT: Activated PTT: 33.1 secs (ref 23.6–35.9)

## 2014-08-30 LAB — MRSA PCR SCREENING

## 2014-09-06 ENCOUNTER — Emergency Department (INDEPENDENT_AMBULATORY_CARE_PROVIDER_SITE_OTHER)
Admission: EM | Admit: 2014-09-06 | Discharge: 2014-09-06 | Disposition: A | Discharge status: Home / Self Care | Payer: Worker's Compensation | Source: Home / Self Care | Attending: Family Medicine | Admitting: Family Medicine

## 2014-09-06 ENCOUNTER — Encounter (HOSPITAL_COMMUNITY): Payer: Self-pay | Admitting: Emergency Medicine

## 2014-09-06 DIAGNOSIS — Z578 Occupational exposure to other risk factors: Secondary | ICD-10-CM | POA: Diagnosis not present

## 2014-09-06 NOTE — Discharge Instructions (Signed)
Follow up with [facility] on source blood results

## 2014-09-06 NOTE — ED Notes (Signed)
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

## 2014-09-06 NOTE — ED Provider Notes (Signed)
CSN: [id number]     Arrival date & time 09/06/14  1737 History   First MD Initiated Contact with Patient 09/06/14 1813     Chief Complaint  Patient presents with  . Body Fluid Exposure   (Consider location/radiation/quality/duration/timing/severity/associated sxs/prior Treatment) Patient is a 53 y.o. female presenting with general illness. The history is provided by the patient.  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 pt has low concern about overall risk.   History reviewed. No pertinent past medical history. History reviewed. No pertinent past surgical history. No family history on file. History  Substance Use Topics  . Smoking status: Never Smoker   . Smokeless tobacco: Not on file  . Alcohol Use: No   OB History    No data available     Review of Systems  Constitutional: Negative.     Allergies  Review of patient's allergies indicates no known allergies.  Home Medications   Prior to Admission medications   Not on File   BP 136/89 mmHg  Pulse 74  Temp(Src) 97.1 F (36.2 C) (Oral)  Resp 18  SpO2 95% Physical Exam  Constitutional: She is oriented to person, place, and time. She appears well-developed and well-nourished. No distress.  HENT:  Head: Normocephalic and atraumatic.  Mouth/Throat: Oropharynx is clear and moist.  Eyes: Conjunctivae and EOM are normal. Pupils are equal, round, and reactive to light.  Neck: Normal range of motion. Neck supple.  Lymphadenopathy:    She has no cervical adenopathy.  Neurological: She is alert and oriented to person, place, and time.  Skin: Skin is warm and dry.  Nursing note and vitals reviewed.   ED Course  Procedures (including critical care time) Labs Review Labs Reviewed - No data to display  Imaging Review No results found.   MDM   1.  Employee exposure to body fluids   pt does not feel w/u needed at this time, has had prev testing done. Will wait on pt blood results    Billy Fischer, MD 09/06/14 [phone]

## 2014-09-13 ENCOUNTER — Inpatient Hospital Stay: Payer: Self-pay | Admitting: Orthopedic Surgery

## 2014-09-14 LAB — CBC WITH DIFFERENTIAL/PLATELET
BASOS PCT: 0.2 %
Basophil #: 0 10*3/uL (ref 0.0–0.1)
EOS ABS: 0 10*3/uL (ref 0.0–0.7)
EOS PCT: 0 %
HCT: 34.2 % — ABNORMAL LOW (ref 35.0–47.0)
HGB: 11.2 g/dL — ABNORMAL LOW (ref 12.0–16.0)
Lymphocyte #: 0.9 10*3/uL — ABNORMAL LOW (ref 1.0–3.6)
Lymphocyte %: 10 %
MCH: 27.6 pg (ref 26.0–34.0)
MCHC: 32.7 g/dL (ref 32.0–36.0)
MCV: 84 fL (ref 80–100)
MONOS PCT: 8.2 %
Monocyte #: 0.7 x10 3/mm (ref 0.2–0.9)
Neutrophil #: 6.9 10*3/uL — ABNORMAL HIGH (ref 1.4–6.5)
Neutrophil %: 81.6 %
PLATELETS: 172 10*3/uL (ref 150–440)
RBC: 4.06 10*6/uL (ref 3.80–5.20)
RDW: 14.6 % — AB (ref 11.5–14.5)
WBC: 8.5 10*3/uL (ref 3.6–11.0)

## 2014-09-14 LAB — BASIC METABOLIC PANEL
Anion Gap: 9 (ref 7–16)
BUN: 8 mg/dL (ref 7–18)
CHLORIDE: 106 mmol/L (ref 98–107)
CREATININE: 0.61 mg/dL (ref 0.60–1.30)
Calcium, Total: 8.5 mg/dL (ref 8.5–10.1)
Co2: 27 mmol/L (ref 21–32)
GLUCOSE: 108 mg/dL — AB (ref 65–99)
Osmolality: 282 (ref 275–301)
Potassium: 3.8 mmol/L (ref 3.5–5.1)
Sodium: 142 mmol/L (ref 136–145)

## 2014-09-15 LAB — CBC WITH DIFFERENTIAL/PLATELET
Basophil #: 0.1 10*3/uL (ref 0.0–0.1)
Basophil %: 0.6 %
Eosinophil #: 0 10*3/uL (ref 0.0–0.7)
Eosinophil %: 0.4 %
HCT: 35.3 % (ref 35.0–47.0)
HGB: 11.7 g/dL — ABNORMAL LOW (ref 12.0–16.0)
LYMPHS PCT: 14.8 %
Lymphocyte #: 1.4 10*3/uL (ref 1.0–3.6)
MCH: 27.8 pg (ref 26.0–34.0)
MCHC: 33 g/dL (ref 32.0–36.0)
MCV: 84 fL (ref 80–100)
MONO ABS: 0.8 x10 3/mm (ref 0.2–0.9)
MONOS PCT: 8.6 %
NEUTROS ABS: 7.3 10*3/uL — AB (ref 1.4–6.5)
Neutrophil %: 75.6 %
PLATELETS: 182 10*3/uL (ref 150–440)
RBC: 4.19 10*6/uL (ref 3.80–5.20)
RDW: 14.9 % — AB (ref 11.5–14.5)
WBC: 9.6 10*3/uL (ref 3.6–11.0)

## 2014-09-16 LAB — CBC WITH DIFFERENTIAL/PLATELET
Basophil #: 0 10*3/uL (ref 0.0–0.1)
Basophil %: 0.3 %
EOS PCT: 2.6 %
Eosinophil #: 0.2 10*3/uL (ref 0.0–0.7)
HCT: 31.2 % — AB (ref 35.0–47.0)
HGB: 10.3 g/dL — ABNORMAL LOW (ref 12.0–16.0)
Lymphocyte #: 1.6 10*3/uL (ref 1.0–3.6)
Lymphocyte %: 21.5 %
MCH: 27.9 pg (ref 26.0–34.0)
MCHC: 33.1 g/dL (ref 32.0–36.0)
MCV: 84 fL (ref 80–100)
MONOS PCT: 9.5 %
Monocyte #: 0.7 x10 3/mm (ref 0.2–0.9)
Neutrophil #: 4.8 10*3/uL (ref 1.4–6.5)
Neutrophil %: 66.1 %
PLATELETS: 169 10*3/uL (ref 150–440)
RBC: 3.7 10*6/uL — ABNORMAL LOW (ref 3.80–5.20)
RDW: 14.8 % — AB (ref 11.5–14.5)
WBC: 7.3 10*3/uL (ref 3.6–11.0)

## 2015-01-12 NOTE — H&P (Signed)
PATIENT NAME:  Christina Zamora, Christina Zamora MR#:  [id number] DATE OF BIRTH:  12/26/1960  DATE OF ADMISSION:  02/17/2013  PRIMARY CARE PHYSICIAN:  Pernell Dupre, MD  PRIMARY CARDIOLOGIST:  Neoma Laming, MD  CHIEF COMPLAINT: Chest pain.   HISTORY OF PRESENT ILLNESS: The patient is a 54 year old white female with history of known coronary artery disease, had a stent placed in 2006, hypertension, hyperlipidemia, hypothyroidism who presents to the ED with complaint of left-sided chest pressure since yesterday. She reports that she has been having occasional chest pressure for the past few weeks, but yesterday at work she started having heavy feeling in the left chest and then when she came home she felt tired and nauseated and felt weak all days. She continued to have these symptoms since yesterday. There was some relief when she got nitroglycerin in the ED.  The nitroglycerin she had at home was expired.  She also got nauseous yesterday and developed a headache, took some migraine Excedrin, but did not help. She also reports that in December she was diagnosed with pneumonia. Since then she has been having some wheezing and cough. She otherwise denies any abdominal pain, vomiting or diarrhea. Denies any numbness or tingling in any of her hands or extremity.   PAST MEDICAL HISTORY: 1.  Coronary artery disease with previous stents to the RCA. She had a cardiac cath in April 2012 that showed patent stents.  2.  Hypertension.  3.  Hyperlipidemia.  4.  Hypothyroidism.  5.  Depression.   PAST SURGICAL HISTORY:  1.  Status post lap band procedure for morbid obesity.  2.  Status post hysterectomy.  3.  Status post cholecystectomy.  4.  Status post bilateral carpal tunnel surgery.  5.  Stent placement to the RCA.   ALLERGIES: No known drug allergies.   CURRENT MEDICATIONS: She is on Plavix 75 p.o. daily, Altace 10 daily, amlodipine 10 daily, atorvastatin 80 at bedtime, levothyroxine 200 mcg daily, nitro stat 0.4  p.r.n. chest pain, Plavix 75 p.o. daily, Wellbutrin XL 150 mg daily.   SOCIAL HISTORY: She does not smoke. She used to smoke, quit in 2002. No alcohol or drug use.   FAMILY HISTORY: Significant for coronary artery disease. Father died of MI at age 68.  Mother had a heart attack in 2012.   REVIEW OF SYSTEMS: CONSTITUTIONAL: Denies any fevers. Complains of fatigue, weakness, chest pressure as above. No weight loss, weight gain.  EYES: No blurred or double vision. No pain. No redness. No inflammation. No glaucoma. No cataracts.  ENT: No tinnitus. No ear pain. No hearing loss. No seasonal or year-round allergies. No epistaxis. No nasal discharge. No difficulty swallowing.  RESPIRATORY: Complains of chronic cough, occasional wheezing. No hemoptysis. No COPD.  No TB.  Has history of pneumonia,  CARDIOVASCULAR: Chest pressure as above. No orthopnea. No edema. No arrhythmia. No syncope.  GASTROINTESTINAL: Complains of nausea, but no vomiting. No diarrhea. No abdominal pain. No hematemesis. No melena. No ulcer. No GERD. No IBS.  No jaundice.  GENITOURINARY: Denies any dysuria, hematuria, renal calculus or frequency. ENDOCRINE:  Denies any polyuria, nocturia. Has hypothyroidism.  HEME/LYMPH:  Denies anemia, easy bruisability or bleeding.  SKIN: No acne. No rash. No changes in mole, hair or skin.  MUSCULOSKELETAL: Denies any pain in the neck, back or shoulder.  No gout.  NEUROLOGIC: No numbness. No CVA. No TIA. No seizure.  Has migraine headaches.  PSYCHIATRIC: Denies any anxiety, insomnia. No ADD.   PHYSICAL EXAMINATION: VITAL SIGNS: Temperature  99.7, pulse 88, respirations 18, blood pressure 135/90, O2 95% on room air.  GENERAL: The patient is a well-developed, well-nourished female, currently not in any acute distress.  HEAD:  Atraumatic, normocephalic.  ENT: Pupils equally round, reactive to light and accommodation. There is no conjunctival pallor. No scleral icterus. Extraocular movements intact.  Nasal exam shows no drainage or ulceration. No masses. Oropharynx is clear without any exudates. External ear exam shows no erythema or swelling.  NECK: There is no JVD. No carotid bruits. No thyroid enlarged. Trachea is midline.  HEART:  Regular rate and rhythm. No murmurs, rubs, clicks. PMI is not displaced. S1, S2 positive.  LUNGS:  No accessory muscle usage. Clear to auscultation bilaterally without any rales, rhonchi or wheezing.  ABDOMEN: Soft, nontender, nondistended. Positive bowel sounds x 4. There is no hepatosplenomegaly. No guarding. No rebound tenderness.  EXTREMITIES: No clubbing, cyanosis, edema.  SKIN: No rash.  LYMPHATICS: No lymph nodes palpable.  VASCULAR: Good DP, PT pulses.  MUSCULOSKELETAL: There is no erythema or swelling. NEUROLOGIC:  Awake, alert, oriented x 3. Cranial nerves II through XII grossly intact. No focal deficits.  PSYCHIATRIC: Not anxious or depressed.  LABORATORY AND DIAGNOSTICS: EKG:  Normal sinus rhythm without any ST-T wave changes.   BMP: Glucose 116, BUN 13, creatinine 0.67, sodium 140, potassium 3.5. Troponin less than 0.02. CPK-MB less than 0.5. WBC 15.7, hemoglobin 12.8 and platelet count is 237.   ASSESSMENT AND PLAN: The patient is a 54 year old with coronary artery disease who presents with chest pain.  1.  Chest pain, possibly due to angina, unstable angina. At this time, we will go ahead and place cardiology consult, place her on full dose Lovenox, continue aspirin and p.r.n. nitro, continue Plavix and follow cardiac enzymes.  2.  Hypertension. We will continue Altace and amlodipine.  3.  Hyperlipidemia. Continue atorvastatin. Fasting lipid panel in the a.m.  4.  Hypothyroidism. Continue Synthroid.  5.  Leukocytosis of unclear etiology.  Await official report on her chest x-ray. Could be reactive. We will follow another CBC in the morning.  6.  Miscellaneous. The patient will be on Lovenox for deep vein prophylaxis.  The patient's service  will be transferred Dr. Elijio Miles who is her primary care provider.  TIME SPENT: 35 minutes spent on H and P.  ____________________________ Lafonda Mosses. Posey Pronto, MD shp:sb D: 02/17/2013 08:34:44 ET T: 02/17/2013 09:07:30 ET JOB#: [id number]  cc: Cesario Weidinger H. Posey Pronto, MD, <Dictator> Alric Seton MD ELECTRONICALLY SIGNED 02/17/2013 14:58

## 2015-01-12 NOTE — H&P (Signed)
PATIENT NAME:  Christina Zamora, Christina Zamora MR#:  [id number] DATE OF BIRTH:  [birth year]  DATE OF ADMISSION:  03/30/2013  PRIMARY CARE PHYSICIAN: Alfredia Ferguson A. Elijio Miles, MD   PRIMARY CARDIOLOGIST: Dionisio David, MD  REFERRING PHYSICIAN: Shirley Friar. Braud, MD  CHIEF COMPLAINT: Chest pain.   HISTORY OF PRESENT ILLNESS: The patient is a pleasant 54 year old female with history of CAD status post previous stents in the RCA, who was admitted here in May for episode of chest pain and had an outpatient stress test and echocardiogram. The patient presents after experiencing chest pain earlier this morning lasting about 10 minutes, central in the chest, "stabby" in nature, with some shortness of breath. She took a nitroglycerin, and the pain subsided. She came into the ER. She complains of occasional chest pressure. She has troponins which were negative x 2, but EKG shows there are new Q waves in V1 and V2 which were not previously there. The case was discussed with Dr. Humphrey Rolls by me, and will admit the patient for possible catheterization.   The patient also complained of having an episode of shakes, cramps and body aches lasting Friday to Sunday, with fevers. That has resolved. She did not have any UTI, GI or URI symptoms at that time and woke up Monday being normal. She had no chest pains at that time. She had no sick contacts.   PAST MEDICAL HISTORY:  1. CAD, status post previous stent in the RCA and several cardiac catheterizations in the past.  2. Hypertension.  3. Hyperlipidemia.  4. Hypothyroidism.  5. Depression.   PAST SURGICAL HISTORY:  1. Status post Lap-Band procedure for morbid obesity.  2. Status post hysterectomy.  3. Cholecystectomy. 4. Bilateral carpal tunnel surgery.   ALLERGIES: No known drug allergies.   CURRENT MEDICATIONS:  1. Metoprolol succinate 50 mg daily at night.  2. Ranexa 1000 mg daily. 3. Plavix 75 mg daily. 4. Altace 10 mg daily.  5. Atorvastatin 80 mg daily. 6.  Levothyroxine 225 mcg daily. 7. Nitrostat p.r.n. 8. Wellbutrin XL 150 mg 1 tab 2 times a day.   FAMILY HISTORY: Father with MI, and he passed away from that at age 42. Mother also had CAD and heart attack.   SOCIAL HISTORY: Ex-smoker, quit in 2002. No alcohol or drug use. Works as a Engineer, manufacturing.   REVIEW OF SYSTEMS:  CONSTITUTIONAL: Positive for recent fevers, resolved on Monday. No fatigue or weakness.  EYES: No blurry vision or double vision.  ENT: No tinnitus or hearing loss.  RESPIRATORY: Has a chronic nocturnal cough that is being evaluated as an outpatient. No wheezing. No hemoptysis or COPD.  CARDIOVASCULAR: Chest pain and pressure as above. No orthopnea or swelling in the legs or passing out.  GASTROINTESTINAL: No nausea, vomiting, diarrhea, abdominal pain, hematemesis, black stools, dark stools.  GENITOURINARY: Denies dysuria, hematuria or frequency.  ENDOCRINE: Denies polyuria, nocturia. Has hypothyroid state.  HEMATOLOGY AND LYMPHATIC: No anemia or easy bruising.  SKIN: No rashes.  MUSCULOSKELETAL: Denies arthritis or gout.  NEUROLOGIC: No stroke or TIA.  PSYCHIATRIC: Denies anxiety or insomnia. Has a history of depression.   PHYSICAL EXAMINATION:  VITAL SIGNS: Temperature on arrival 98.2, pulse rate 67, respiratory rate 20, blood pressure 164/90, O2 saturation 98% on room air, last blood pressure 148/71.  GENERAL: The patient is an obese Caucasian female lying in bed in no obvious distress.  HEENT: Normocephalic, atraumatic. Pupils are equal and reactive. Anicteric sclerae. Extraocular muscles intact. Moist mucous membranes.  NECK: Supple.  No thyroid tenderness. No cervical lymphadenopathy.  CARDIOVASCULAR: S1, S2, bradycardic. No murmurs, rubs or gallops.  LUNGS: Clear to auscultation. No wheezing, rhonchi or rales.  ABDOMEN: Soft, nontender, nondistended. Positive bowel sounds in all quadrants.  EXTREMITIES: No significant lower extremity edema.  SKIN: No obvious  rashes.  NEUROLOGIC: Cranial nerves II through XII grossly intact. Strength is 5 out of 5 in all extremities. Sensation intact to light touch.  PSYCHIATRIC: Awake, alert, oriented, pleasant, cooperative.   LABORATORY AND IMAGING DATA: Troponins were negative x 2. EKG shows sinus rate of 66, some Q waves in aVR, which were previously seen, and some Q waves in V1 and V2, which were not there prior. No acute ST elevations or depressions. X-ray of the chest, PA and lateral: No evidence of acute cardiopulmonary disease. Potassium 3.6, sodium 143, creatinine 0.95, BUN 15. Hemoglobin 12.2, platelets are 263, WBC 6.8.   ASSESSMENT AND PLAN: A 54 year old Caucasian female with a history of coronary artery disease and stenting in the past and several catheterizations in the past, hypertension, hyperlipidemia and hypothyroid state, with a recent stress test and echocardiogram as an outpatient with Dr. Humphrey Rolls, who presents with repeat chest pain and new EKG changes of Q waves. She has no chest pain, but has occasional chest pressure. Admit the patient to the hospital for possible unstable angina. She has 2 negative troponins and no chest pain now. Would start her on heparin drip. Continue Plavix. Add aspirin and continue statin. Will check a lipid profile, cycle the troponins, and the case was discussed with Dr. Humphrey Rolls already, who is likely planning a catheterization. I would add a nitroglycerin patch as well as continue her Ranexa. Would add morphine p.r.n. as well and admit her to telemetry. Will await further input from Dr. Humphrey Rolls. Would check a lipid profile as well as hemoglobin A1c and resume the thyroid medicine and check a TSH. I would cut back on the metoprolol as she has bouts of bradycardia to the 50s. Her blood pressure otherwise is stable. For deep venous thrombosis prophylaxis, she will be on a heparin drip. I would continue her depression medication as well.   TOTAL TIME SPENT: 50 minutes.   Case discussed  with the ER physician and Dr. Humphrey Rolls.   ____________________________ Vivien Presto, MD sa:OSi D: 03/30/2013 09:16:35 ET T: 03/30/2013 09:29:15 ET JOB#: [id number]  cc: Vivien Presto, MD, <Dictator> Sheikh A. Elijio Miles, MD Dionisio David, MD Karel Jarvis [facility] MD ELECTRONICALLY SIGNED 04/18/2013 14:34

## 2015-01-12 NOTE — Consult Note (Signed)
PATIENT NAME:  Christina Zamora, Christina Zamora MR#:  [id number] DATE OF BIRTH:  [birth year]  DATE OF CONSULTATION:  03/30/2013  REFERRING PHYSICIAN:  Dr. Bridgette Habermann  CONSULTING PHYSICIAN:  Merla Riches, PA-C  PRIMARY CARE PHYSICIAN:  Pernell Dupre, MD  PRIMARY CARDIOLOGIST: Neoma Laming, MD  REASON FOR CONSULTATION: Chest pain, unstable angina.   HISTORY OF PRESENT ILLNESS: Ms. Christina Zamora is a pleasant 54 year old white female who is well known to our practice. Ms. Christina Zamora has a past medical history significant for hypertension, hyperlipidemia, hypothyroidism, depression and coronary artery disease. She was admitted at [facility] on 02/17/2013 for chest pain and had outpatient cardiac evaluation including echocardiogram and nuclear stress testing that were unremarkable at that time. The patient notes that early this morning when she is getting ready for work, she began to have sharp mid and left-sided chest pain and pressure. The pain was rated as 10 out of 10 on the pain scale and radiated to her left shoulder. The patient notes that her pain lasted about 10 minutes and resolved after she took some lingual nitroglycerin. During her episode of pain, she had associated shortness of breath, nausea, became hot and  clammy and was a little bit dizzy.   Of note, the patient states that she did not feel well last Saturday evening and woke up with fevers, chills, body ache and cramps. She rested at home and felt better on Monday when she returned to work. She has been able to work 12 to 13 hour shifts since her last hospital discharge at the end of May.   PAST MEDICAL HISTORY: 1.  Coronary artery disease with a history of stenting in the mid right coronary artery, which was placed at [facility] on 10/18/2004.  2.  Hypertension.  3.  Hyperlipidemia.  4.  Hypothyroidism.  5.  Depression  6.  Mitral regurgitation.   PAST SURGICAL HISTORY: 1.  Status post lap band for obesity.  2.   Hysterectomy.  3.  Cholecystectomy.  4.  Bilateral carpal tunnel release.   ALLERGIES:  1.  TAPE.  2.  IMDUR, WHICH IS AN INTOLERANCE AND GIVES HER A SEVERE HEADACHE.   HOME MEDICATIONS: 1.  Amlodipine 10 mg p.o. daily.  2.  Bupropion HCl ER 150 mg p.o. b.i.d.  3.  Levoxyl 225 mcg p.o. q. a.m.  4.  Lipitor 80 mg p.o. daily.  5.  Nitrostat 0.4 mg some sublingually as needed for chest pain.  6.  Pantoprazole 40 mg p.o. daily.  7.  Ramipril 10 mg p.o. daily.  8.  Ranexa 500 mg p.o. b.i.d.    SOCIAL HISTORY: The patient works at [employer], works 12 to 13 hours shifts, she is not a smoker, but smoked in the past, she denies any alcohol use or illicit drug use.   FAMILY HISTORY: Coronary artery disease in her father who died of a myocardial infarction and also in her mother.   REVIEW OF SYSTEMS:  GENERAL: The patient complains of recent fever, chills that occurred on Saturday and resolved on Monday.  EYES: The patient denies any blurry vision, double vision.  ENT: The patient denies any tinnitus, hearing loss.  RESPIRATORY: The patient complains of shortness of breath with her chest pain, chronic cough.  CARDIOVASCULAR: The patient complains of chest pain as described in the history of present illness.  GASTROINTESTINAL: The patient complains of nausea with the chest pain. Denies vomiting.   PHYSICAL EXAMINATION: GENERAL: This is a pleasant  female who is not in any acute distress.  She is alert and oriented x 3.  VITAL SIGNS: Temperature 97.9 degrees Fahrenheit, heart rate 62, respiratory rate 18, blood pressure 157/91 and oxygen saturation is 100% on room air.  HEENT: Head atraumatic, normocephalic. Eyes:  Pupils  round, equal. There is no scleral icterus. Conjunctivae are pale, pink. Ears and nose are normal to external inspection.  MOUTH: Moist mucous membranes good dentition.  NECK: Neck is supple. Trachea is midline. There are no JVD. No carotid bruits. Thyroid smooth  and mobile.  LUNGS: Clear to auscultation bilaterally. No adventitious breath sounds. No accessory muscle use.  CARDIOVASCULAR: Regular rate and rhythm. No murmurs, rubs or gallops appreciated.  ABDOMEN: Nondistended. It is soft and nontender to palpation with no hepatosplenomegaly appreciated.  EXTREMITIES: No cyanosis, clubbing, or edema.    LABORATORY, DIAGNOSTIC AND RADIOLOGIC DATA: EKG on admission: Normal sinus rhythm with some nonspecific septal changes/new Q waves.   Chest x-ray from July 9: No evidence of acute cardiopulmonary disease.   Glucose 97, BUN 15, creatinine 0.95, sodium 143, potassium 3.6, estimated GFR is greater than 60. CK 99, CK-MB 0.9. Troponin I is less than 0.02 (this was repeated x 2). White blood cell count 6.8, hemoglobin 12.2, hematocrit 36.0, platelet count 263,000, PTT 33.7.   ASSESSMENT/PLAN:  1.  Chest pain in a patient with a history of known coronary artery disease. The patient has had multiple cardiac catheterizations, originally had stenting of the mid RCA in 2006 at [facility], and had recurrent cardiac catheterizations in 01/14/2006, July 2008, August 2010, and most recently week 01/18/2011, which showed no significant coronary artery disease and a stent in the RCA that was patent. Her cardiac catheterization done at [facility] by Dr. Terrence Dupont in 2010 did have a have some critical ostial disease of OM, which was too small for PCI and this lesion may be what is causing the patient's pain. Troponin has been negative x 2. Agree with the current medical management with heparin drip, aspirin, nitroglycerin ointment, Plavix, aspirin, ACE inhibitor and Ranexa. We will proceed with cardiac catheterization tomorrow, which will be done by Dr. Neoma Laming. The risks and benefits of the procedure were discussed with the patient in detail and she is willing to proceed.  2.  Chills with fever. The patient had an episode of chills and fever last weekend and she is concerned  about underlying infection; however, her white blood cell count is normal this time. If she develops fever, we can order blood cultures.   Thank you very much for this consultation and allowing [place] to participate in this patient's care,   ____________________________ Merla Riches, PA-C mam:cc D: 03/30/2013 13:24:15 ET T: 03/30/2013 14:27:26 ET JOB#: [id number]  cc: Merla Riches, PA-C, <Dictator> Caden Fatica A Neldon Shepard PA ELECTRONICALLY SIGNED 03/31/2013 9:51

## 2015-01-12 NOTE — Consult Note (Signed)
Brief Consult Note: Diagnosis: CP in patient with CAD.   Patient was seen by consultant.   Consult note dictated.   Recommend to proceed with surgery or procedure.   Recommend further assessment or treatment.   Orders entered.   Comments: Patient with L sided CP radiating into L shoulder this am, associated SOB and nausea. Patient has h/o CAD and stent to RCA, now with EKG changes in septal leads. CP subsided with nitro paste, pt on hepatin gtt, statin, Plavix, ASA, Ranexa. She had recent echo as outpatient with normal LVEF, Grade 1 diastolic dysfx, mild MR. Stress test 02/22/13 was normal. Patient CP may be from osteal disease of OM as has lesion that was too small for PCI on cath done at [facility] in past. Last cardiac cath here 12/2010 with no significant CAD and patent RCA stent. Agree with current management, cycle CE. and plan for cardiac cath tomorrow.  Electronic Signatures: Angelica Ran (MD)   (Signed 09-Jul-14 16:47)  Authored: Brief Consult Note Manzi, Monica A (PA-C)   (Entered 09-Jul-14 13:14)  Entered: Brief Consult Note  Last Updated: 09-Jul-14 16:47 by Angelica Ran (MD)

## 2015-01-12 NOTE — Consult Note (Signed)
PATIENT NAME:  Christina, Zamora MR#:  [id number] DATE OF BIRTH:  October 31, 1960  DATE OF CONSULTATION:  02/18/2013  REFERRING PHYSICIAN: Dustin Flock, MD  CONSULTING PHYSICIAN:  Neoma Laming, MD  REFERRING PHYSICIAN: Pernell Dupre, MD   REASON FOR CONSULTATION: Chest pain.   HISTORY OF PRESENT ILLNESS: Ms. Christina Zamora is a 54 year old white female with a past medical history significant for coronary artery disease, hypertension, hyperlipidemia and hypothyroidism. The patient notes for the last couple of weeks she has had intermittent chest pain. The patient notes she can occasionally have some sharp pain at work and then had pain at the bottom of her rib cage radiating up into her chest. She thought her symptoms may been from severe indigestion and was taking pantoprazole and Tums, which did not relieve her symptoms. She notes on that prior to her admission she felt very fatigued, had a headache, was diaphoretic and had a cough that was worse in the recumbent position or on her left side. The patient tried over-the-counter remedies such as Excedrin Migraine and Pepto-Bismol and felt nauseated. The patient was given sublingual nitroglycerin and aspirin and her chest pain has now resolved. Of note, she does note some increased pedal edema over the last 2 weeks.   PAST MEDICAL HISTORY:  1.  Coronary artery disease. Cardiac catheterization 01/20/2011 with mild CAD in the LAD (56%), stent in the mid RCA is patent with only 20% restenosis and left ventricular ejection fraction 60%.  2.  Hypertension.  3.  Hyperlipidemia.  4.  Hypothyroidism.  5.  Depression.   PAST SURGICAL HISTORY:  1.  Status post lap band procedure for obesity.  2.  Status post hysterectomy.  3.  Status post cholecystectomy.  4.  Status post bilateral carpal tunnel release.  5.  Stent placement to the RCA done in 2006.  6.  Breast lumpectomy (benign).   ALLERGIES: No known drug allergies.   HOME MEDICATIONS:  1.   Plavix 75 mg daily. 2.  Altace 10 mg daily.  3.  Amlodipine 10 mg p.o. daily.  4.  Atorvastatin 80 mg at bedtime.  5.  Levothyroxine 200 mcg daily.  6.  Nitrostat 0.4 mg as needed for chest pain.  7.  Pantoprazole 40 mg daily.  8. Wellbutrin XL 150 mg daily.   SOCIAL HISTORY: The patient works as a Merchandiser, retail. She does not smoke, quit in 2002. She denies any alcohol or illicit drug use.   FAMILY HISTORY: Significant for coronary artery disease in her father, who died of a MI at age 11 and her mother also died of a heart attack in her 69s.   REVIEW OF SYSTEMS: CONSTITUTIONAL: The patient denies any fevers. Complains of weakness and fatigue. EYES: The patient denies blurred vision or double vision. ENT: The patient denies any tinnitus or hearing loss. RESPIRATORY: The patient has chronic cough,  worse if she lies on her left side and is coughing up clear phlegm. CARDIOVASCULAR: The patient complained of chest pain as mentioned above, no palpitations. GI: The patient denies any vomiting or abdominal pain.   PHYSICAL EXAMINATION:  GENERAL: This is a pleasant female who is not in any acute distress. She is alert and oriented x 3.  VITAL SIGNS: Temperature 97.8 degrees Fahrenheit, heart rate 63, respiratory rate 18, blood pressure 121/72 and oxygen saturation is 95% on room air.  HEENT: Head atraumatic, normocephalic. Eyes: Pupils are round, equal, there is no scleral icterus. Conjunctivae are pale, pink. Ears and nose are  normal to external inspection. Mouth: Good dentition. Moist mucous membranes.  NECK: Supple. Trachea is midline. Thyroid is smooth and mobile. There are no carotid bruits.  LUNGS: Clear to auscultation bilaterally with no adventitious breath sounds appreciated. No accessory muscle use.  CARDIOVASCULAR: Regular rate and rhythm. No murmurs, rubs, or gallops appreciated.  ABDOMEN: Nondistended. Bowel sounds are present in all 4 quadrants. The abdomen is soft and nontender to  palpation.  EXTREMITIES: The patient has TED hose on with only trace pedal edema.   ANCILLARY DATA: EKG on admission with normal sinus rhythm, 98 beats per minute, right axis deviation and a pulmonary disease pattern. Chest x-ray: Moderate severe thoracic scoliosis, possible linear fibrosis versus subsegmental atelectasis.   LABORATORY DATA: Glucose 78, BNP 77, BUN 16, creatinine 0.75, sodium 140, potassium 3.4, chloride 106, CO2 30. Estimated GFR is greater than 60. Total cholesterol is 161, LDL is 88, VLDL is 32, HDL is 41 and triglycerides 161. Hemoglobin A1c is 5.3. CK is 50. CK-MB 0.5, troponin I is less than 0.02 x 3. TSH 0.801. White blood cell count 8.1, hemoglobin 12.0, hematocrit 34.6 and platelet count 231,000.   ASSESSMENT AND PLAN:  1.  Chest pain. The patient does have a history of coronary artery disease and had a cardiac catheterization as mentioned above with mild disease in the left anterior descending and a stent in the mid right coronary artery that was patent. She was given sublingual nitroglycerin in the Emergency Department has not had any further chest pain. Review of her EKG did not show any acute changes, BNP is negative and chest x-ray without any evidence of pleural effusion. We will add long acting nitrate to the patient's medication regimen. As long as she does not have any further chest pain, she is stable for discharge and will see her Monday morning, 06/02 for echocardiogram, office visit and we can schedule nuclear stress testing at that time.  2.  Hypertension, well controlled.  3.  Hyperlipidemia, on treatment with high-dose statin.   Thank you very much for this consultation and allowing [place] to participate in this patient's care. We will continue to follow this patient with you. ____________________________ Merla Riches, PA-C mam:aw D: 02/18/2013 08:30:27 ET T: 02/18/2013 09:08:34 ET JOB#: [id number]  cc: Merla Riches, PA-C, <Dictator> Chigozie Basaldua A Mette Southgate  PA ELECTRONICALLY SIGNED 02/25/2013 11:49

## 2015-01-12 NOTE — Consult Note (Signed)
Brief Consult Note: Diagnosis: CP.   Comments: Patient with intermittent CP x 2 weeks, weakness, cough. She though sx from GERD but no relief with PPI. Pain relieved with NTG, EKG with no acute changes, TNI neg x 3, CXR with no peural effusions. Will add long-acting nitrate and if no further CP pt can be d/c home with f/u in office Monday 6/2 at 9:45am for appt and echocardiogram. Canm schedule outpatient stress testing at that time.  Electronic Signatures: Angelica Ran (MD)   (Signed 31-May-14 10:45)  Co-Signer: Brief Consult Note Gabriel Rung A (PA-C)   (Signed 30-May-14 08:22)  Authored: Brief Consult Note  Last Updated: 31-May-14 10:45 by Angelica Ran (MD)

## 2015-01-13 NOTE — Op Note (Signed)
PATIENT NAME:  Christina Zamora, Christina Zamora MR#:  [id number] DATE OF BIRTH:  [birth year]  DATE OF PROCEDURE:  09/13/2014  PREOPERATIVE DIAGNOSIS: Left knee osteoarthritis.   POSTOPERATIVE DIAGNOSIS:  Left knee osteoarthritis.    PROCEDURE: Left total knee arthroplasty.   SURGEON: Thornton Park, M.D.   ASSISTANT:  Christophe Louis, MD.  ESTIMATED BLOOD LOSS: 25 mL.   COMPLICATIONS:  None.    TOURNIQUET TIME:  125 minutes.   IMPLANTS:  DePuy PFC Sigma posterior stabilized femoral component, size 2.5, a DePuy rotating tibial platform with an MBT keel size 2.5, a 10 mm tibial polyethylene insert for rotating platform size 2.5, a 3-pegged domed patella polyethylene component, size 35 mm.   INDICATION FOR THE PROCEDURE: Christina Zamora is a 54 year old female who has had persistent left knee pain, which has not responded to nonoperative management including corticosteroid and hyaluronic acid injections as well as bracing and exercise. The patient wished to proceed with a left total knee arthroplasty, given her persistence of pain which is affecting her ability to perform at work and affects her ability to ambulate. Her x-rays have shown significant joint space narrowing with marginal osteophytes, subchondral sclerosis, and early cyst formation. I reviewed the risks and benefits of surgery with the patient prior to the date of surgery in my office. She understands the risks include infection requiring removal of the prosthesis, bleeding requiring blood transfusion, nerve or blood vessel injury, knee stiffness, fracture, dislocation, persistent pain or instability, hardware failure and the need for further surgery. Medical risks include, but are not limited to deep vein thrombosis and pulmonary embolism, myocardial infarction, stroke, pneumonia, respiratory failure and death. The patient understood these risks and wished to proceed.   The patient was met with preoperative area. I marked the left knee within the  operative field according to the hospital's correct site protocol. She was then brought to the Operating Room where she underwent a spinal anesthetic. She was then placed supine on the operative table. A bump was placed under her left hip. All bony prominences were adequately padded. She was prepped and draped in a sterile fashion. A timeout was performed to verify the patient's name, date of birth, medical record number, correct site of surgery and correct procedure to be performed. It was also used to verify the patient had received antibiotics and all appropriate instruments, implants, and radiographic studies were available in the room. Once all in attendance were in agreement, the case began. Examination under anesthesia revealed range of motion from 0-120 degrees with no ligamentous laxity.   A proposed incision was drawn out with a surgical marker. A midline incision was then made with a #10 blade. Full thickness skin flaps were developed. A medial arthrotomy was then made and the patella was everted and the knee was flexed to approximately 90 degrees. Hoffa's fat pad along with the cruciate ligaments and medial and lateral menisci were debrided. An hole was placed the distal femur with a drill to allow for placement of the intramedullary distal femoral cutting guide. This guide was pinned into position for resection of 10 mm. The distal femoral resection was made with an oscillating saw. The attention was then turned to the tibia.   An external tibial alignment guide was placed over the anterior tibia. This was placed in line with the second array. This was measured to resect 8 mm off the high side and pinned into position.  This guide was placed parallel with the anterior tibia to maintain the native  slope. The external tibial alignment guide was then removed leaving the cutting block proximally. A drop down guide was used to confirm that the cutting block was perpendicular to the mechanical axis. A  cross pin was then placed. The tibial cut was then made with the oscillating saw. The 10 mm spacer block still could not be placed so an additional tibial cut was made to allow for placement at 10 mm spacer block and extension. The ligaments were well balanced.   The attention was turned back to the femur. The posterior referencing femoral sizing guide was then placed. The appropriate size was found to be 2.5. The sizing guide was then adjusted for a 2.5 component and pinned into position. These pins were then removed and a 2.5 mm femoral cutting block was placed and pinned into position. A lobster claw stylus was used to ensure that no notching of the femur would occur during the distal femoral osteotomy. The anterior, posterior and chamfer cuts were made with an oscillating saw. The size 2.5 cutting block was then removed. The 2.75 mm box cutting guide was then pinned into position and this was, again, made with an oscillating saw. This box was cut for the posterior stabilized component.   The attention was then turned back to the tibia. A size 2.5 tibial tray was pinned into position. This had excellent coverage of the proximal tibia. The drill tower was then placed and gently malleted into position. The keel was then reamed with the appropriate size reamer and a winged punch was gently malleted into position. The pins were then pulled.  The size 2.5 femoral trial was then placed and a 10 mm polyethylene trial was then interposed between the femoral and tibial trial components. The knee was brought into full extension and had excellent stability with varus valgus stress throughout the range of motion. The attention was then turned to preparation of the patella.   The patellar cutting guide was placed after the thickness of the patella was measured with a caliper. The appropriate resection dialed into the patellar cutting guide. An oscillating saw was used to make the patella osteotomy.  The diameter of the  patella was found to be 35 mm.  The peg drill guide was placed on the undersurface of the patella. The three pegs were drilled. The trial patellar component was then placed and the knee was brought through a range of motion. The patella had slight lateral tilt and a limited lateral release using electrocautery, was performed. All trial components were removed. The joint was copiously irrigated with GU impregnated saline. Exparel was injected around the capsule prior to placement of the components. All bone surfaces were adequately dried. The methylmethacrylate was prepared on the back table and applied to all exposed bone surfaces of the tibia, femur and patella. The actual DePuy rotating tibial platform 2.5 component was then malleted into position. Excess methylmethacrylate was removed. Also the 2.5 mm Sigma posterior stabilized femoral component was then malleted into position. Again, excess methylmethacrylate was removed. The component was then cemented in position, as well as with the methylmethacrylate excess methylmethacrylate was removed. A trial 10 mm insert were applied and the knee was brought into full extension to allow the methylmethacrylate to set. Once it was set the wound was copiously irrigated. The knee was taken through a full range of motion. All components were stable. The patient had excellent stability with a 10 mm trial. Therefore, the decision was made to place a 10 mm  polyethylene rotating platform component. After placement of this component the knee, again, was brought through a full range of motion and found to have excellent stability. The wound, again was copiously irrigated with pulse lavage. An Autovac was placed and the two limbs of the drain were brought out through the superolateral aspect of the left knee. The medial arthrotomy was closed with a #1 Ethibond. The subcutaneous tissues were closed with zero and 2-0 Vicryl and the skin approximated with staples. A dry sterile and  compressive dressing was applied along with a TENS unit and Polar Care sleeve. The left knee was placed in a knee immobilizer. I was scrubbed and present for the entire case, and all sharp and instrument counts were correct at conclusion of the case. The patient was transferred to a hospital bed and brought to the PACU in stable condition. I spoke with the patient's family in the postoperative consultation room to let them know the patient was stable in the recovery room and the case had gone without complication.    ____________________________ Timoteo Gaul, MD klk:at D: 09/14/2014 10:41:16 ET T: 09/14/2014 12:29:18 ET JOB#: [id number]  cc: Timoteo Gaul, MD, <Dictator> Timoteo Gaul MD ELECTRONICALLY SIGNED 09/16/2014 9:47

## 2015-01-21 NOTE — Discharge Summary (Signed)
PATIENT NAME:  Christina Zamora, KURI MR#:  [id number] DATE OF BIRTH:  March 30, 1961  DATE OF ADMISSION:  09/13/2014 DATE OF DISCHARGE:  09/16/2014  REASON FOR ADMISSION: Status post left total knee arthroplasty.   HISTORY: Ms. Maniaci is a 54 year old female who has had persistent left knee pain which did not resolve with nonoperative management. She elected to proceed with a left total knee arthroplasty.  HOSPITAL COURSE: The patient underwent an uncomplicated left total knee arthroplasty under a spinal anesthetic on 09/13/2014. Postoperatively, she was admitted to the orthopedic surgery floor. She received 24 hours of postoperative antibiotics. She had an Autovac drain placed in the left knee, which was removed on postoperative day #1. The patient was evaluated by physical and occupational therapy beginning on postoperative day #1, and they continued to follow her throughout her hospitalization. On the day of the operation, the patient had episodes of vomiting. This was controlled with Zofran. This had improved by postoperative day #1. Her pain was controlled on oxycodone beginning on postoperative day #1. Her Autovac drain was discontinued. She was on enteric-coated aspirin 325 mg b.i.d. for DVT prophylaxis, as she takes Plavix at baseline. She was also given TED hose and AVI compression boots for additional prophylaxis while an inpatient. On postoperative day #2, her Foley catheter was removed. The patient was feeling well. She had minimal pain. She was progressing with physical therapy. Labs were checked during her hospitalization but the patient had a stable hematocrit and hemoglobin and did not require transfusion while during her postoperative stay. On postoperative day #3, the patient continued to progress well. Her pain was controlled. She had a bowel movement and was getting up out of bed to the bathroom. She had no complaints. Given her clinical improvement, she was prepared for discharge home.    DISCHARGE INSTRUCTIONS: The patient will be discharged home with instructions to remain partial weight-bearing on the left lower extremity for a total of 4 weeks. She will receive home physical therapy initially and then transitioned to outpatient PT. She will remain on enteric-coated aspirin 325 mg p.o. b.i.d. for DVT prophylaxis for a total of 6 weeks postoperative. The patient will continue wearing her TED stockings at home until followup. She should continue to elevate her lower extremities whenever possible. She will continue with her home medications and will be given a prescription for oxycodone 5 mg tablets with instructions to take 1 to 2 tabs every 4 to 6 hours p.r.n. for pain control. She will follow up with me in 10 to 14 days for a wound check and staple removal.   ____________________________ Timoteo Gaul, MD klk:ST D: 09/29/2014 21:10:40 ET T: 09/29/2014 21:50:40 ET JOB#: [id number]  cc: Timoteo Gaul, MD, <Dictator> Timoteo Gaul MD ELECTRONICALLY SIGNED 10/10/2014 11:01

## 2015-08-25 ENCOUNTER — Other Ambulatory Visit: Payer: Self-pay

## 2015-08-25 ENCOUNTER — Observation Stay
Admission: EM | Admit: 2015-08-25 | Discharge: 2015-08-26 | Disposition: A | Discharge status: Home / Self Care | Payer: BLUE CROSS/BLUE SHIELD | Attending: Internal Medicine | Admitting: Internal Medicine

## 2015-08-25 ENCOUNTER — Emergency Department: Payer: BLUE CROSS/BLUE SHIELD

## 2015-08-25 ENCOUNTER — Encounter: Payer: Self-pay | Admitting: Emergency Medicine

## 2015-08-25 DIAGNOSIS — R0602 Shortness of breath: Secondary | ICD-10-CM | POA: Insufficient documentation

## 2015-08-25 DIAGNOSIS — Z7902 Long term (current) use of antithrombotics/antiplatelets: Secondary | ICD-10-CM | POA: Diagnosis not present

## 2015-08-25 DIAGNOSIS — E039 Hypothyroidism, unspecified: Secondary | ICD-10-CM | POA: Diagnosis not present

## 2015-08-25 DIAGNOSIS — I209 Angina pectoris, unspecified: Secondary | ICD-10-CM

## 2015-08-25 DIAGNOSIS — E785 Hyperlipidemia, unspecified: Secondary | ICD-10-CM | POA: Insufficient documentation

## 2015-08-25 DIAGNOSIS — Z79899 Other long term (current) drug therapy: Secondary | ICD-10-CM | POA: Insufficient documentation

## 2015-08-25 DIAGNOSIS — R079 Chest pain, unspecified: Secondary | ICD-10-CM | POA: Diagnosis not present

## 2015-08-25 DIAGNOSIS — I25119 Atherosclerotic heart disease of native coronary artery with unspecified angina pectoris: Principal | ICD-10-CM | POA: Insufficient documentation

## 2015-08-25 DIAGNOSIS — F329 Major depressive disorder, single episode, unspecified: Secondary | ICD-10-CM | POA: Insufficient documentation

## 2015-08-25 DIAGNOSIS — I119 Hypertensive heart disease without heart failure: Secondary | ICD-10-CM | POA: Insufficient documentation

## 2015-08-25 DIAGNOSIS — K219 Gastro-esophageal reflux disease without esophagitis: Secondary | ICD-10-CM | POA: Diagnosis not present

## 2015-08-25 DIAGNOSIS — M1990 Unspecified osteoarthritis, unspecified site: Secondary | ICD-10-CM | POA: Insufficient documentation

## 2015-08-25 DIAGNOSIS — M25512 Pain in left shoulder: Secondary | ICD-10-CM | POA: Insufficient documentation

## 2015-08-25 DIAGNOSIS — Z8349 Family history of other endocrine, nutritional and metabolic diseases: Secondary | ICD-10-CM | POA: Insufficient documentation

## 2015-08-25 DIAGNOSIS — Z87891 Personal history of nicotine dependence: Secondary | ICD-10-CM | POA: Diagnosis not present

## 2015-08-25 DIAGNOSIS — Z8249 Family history of ischemic heart disease and other diseases of the circulatory system: Secondary | ICD-10-CM | POA: Diagnosis not present

## 2015-08-25 DIAGNOSIS — Z955 Presence of coronary angioplasty implant and graft: Secondary | ICD-10-CM | POA: Insufficient documentation

## 2015-08-25 DIAGNOSIS — Z833 Family history of diabetes mellitus: Secondary | ICD-10-CM | POA: Diagnosis not present

## 2015-08-25 HISTORY — DX: Hyperlipidemia, unspecified: E78.5

## 2015-08-25 HISTORY — DX: Essential (primary) hypertension: I10

## 2015-08-25 HISTORY — DX: Gastro-esophageal reflux disease without esophagitis: K21.9

## 2015-08-25 HISTORY — DX: Unspecified osteoarthritis, unspecified site: M19.90

## 2015-08-25 LAB — CBC
HEMATOCRIT: 41.4 % (ref 35.0–47.0)
HEMATOCRIT: 37.7 % (ref 35.0–47.0)
HEMOGLOBIN: 13.6 g/dL (ref 12.0–16.0)
HEMOGLOBIN: 12.6 g/dL (ref 12.0–16.0)
MCH: 27.3 pg (ref 26.0–34.0)
MCH: 27.8 pg (ref 26.0–34.0)
MCHC: 32.8 g/dL (ref 32.0–36.0)
MCHC: 33.5 g/dL (ref 32.0–36.0)
MCV: 83.2 fL (ref 80.0–100.0)
MCV: 83 fL (ref 80.0–100.0)
Platelets: 231 10*3/uL (ref 150–440)
Platelets: 217 10*3/uL (ref 150–440)
RBC: 4.98 MIL/uL (ref 3.80–5.20)
RBC: 4.54 MIL/uL (ref 3.80–5.20)
RDW: 14.1 % (ref 11.5–14.5)
RDW: 14 % (ref 11.5–14.5)
WBC: 6.1 10*3/uL (ref 3.6–11.0)
WBC: 5.2 10*3/uL (ref 3.6–11.0)

## 2015-08-25 LAB — BASIC METABOLIC PANEL
ANION GAP: 8 (ref 5–15)
BUN: 22 mg/dL — ABNORMAL HIGH (ref 6–20)
CHLORIDE: 107 mmol/L (ref 101–111)
CO2: 27 mmol/L (ref 22–32)
CREATININE: 0.78 mg/dL (ref 0.44–1.00)
Calcium: 9.7 mg/dL (ref 8.9–10.3)
GFR calc non Af Amer: >60 mL/min (ref >60)
Glucose, Bld: 127 mg/dL — ABNORMAL HIGH (ref 65–99)
POTASSIUM: 3.7 mmol/L (ref 3.5–5.1)
SODIUM: 142 mmol/L (ref 135–145)

## 2015-08-25 LAB — CREATININE, SERUM
Creatinine, Ser: 0.71 mg/dL (ref 0.44–1.00)
GFR calc Af Amer: >60 mL/min (ref >60)

## 2015-08-25 LAB — TROPONIN I
Troponin I: <0.03 ng/mL (ref <0.031)
Troponin I: <0.03 ng/mL (ref <0.031)

## 2015-08-25 MED ORDER — PANTOPRAZOLE SODIUM 40 MG PO TBEC
40.0000 mg | DELAYED_RELEASE_TABLET | Freq: Every day | ORAL | Status: DC
  Administered 2015-08-26: 40 mg via ORAL
  Filled 2015-08-25: qty 1

## 2015-08-25 MED ORDER — ACETAMINOPHEN 650 MG RE SUPP: 650.0000 mg | Freq: Four times a day (QID) | RECTAL | Status: DC | PRN

## 2015-08-25 MED ORDER — ONDANSETRON HCL 4 MG/2ML IJ SOLN: 4.0000 mg | Freq: Four times a day (QID) | INTRAMUSCULAR | Status: DC | PRN

## 2015-08-25 MED ORDER — SODIUM CHLORIDE 0.9 % IJ SOLN
3.0000 mL | Freq: Two times a day (BID) | INTRAMUSCULAR | Status: DC
  Administered 2015-08-25 – 2015-08-26 (×2): 3 mL via INTRAVENOUS

## 2015-08-25 MED ORDER — AMLODIPINE BESYLATE 10 MG PO TABS
10.0000 mg | ORAL_TABLET | Freq: Every day | ORAL | Status: DC
  Administered 2015-08-25 – 2015-08-26 (×2): 10 mg via ORAL
  Filled 2015-08-25 (×2): qty 1

## 2015-08-25 MED ORDER — ENOXAPARIN SODIUM 40 MG/0.4ML ~~LOC~~ SOLN
40.0000 mg | SUBCUTANEOUS | Status: DC
  Administered 2015-08-25: 40 mg via SUBCUTANEOUS
  Filled 2015-08-25: qty 0.4

## 2015-08-25 MED ORDER — NITROGLYCERIN 0.4 MG SL SUBL
0.4000 mg | SUBLINGUAL_TABLET | SUBLINGUAL | Status: DC | PRN
  Administered 2015-08-26: 0.4 mg via SUBLINGUAL
  Filled 2015-08-25: qty 1

## 2015-08-25 MED ORDER — RAMIPRIL 5 MG PO CAPS
10.0000 mg | ORAL_CAPSULE | Freq: Every day | ORAL | Status: DC
  Administered 2015-08-26: 10 mg via ORAL
  Filled 2015-08-25: qty 2

## 2015-08-25 MED ORDER — CELECOXIB 200 MG PO CAPS
200.0000 mg | ORAL_CAPSULE | Freq: Every morning | ORAL | Status: DC
  Administered 2015-08-26: 200 mg via ORAL
  Filled 2015-08-25: qty 1

## 2015-08-25 MED ORDER — ATORVASTATIN CALCIUM 20 MG PO TABS
80.0000 mg | ORAL_TABLET | Freq: Every day | ORAL | Status: DC
  Administered 2015-08-26: 80 mg via ORAL
  Filled 2015-08-25: qty 4

## 2015-08-25 MED ORDER — CLOPIDOGREL BISULFATE 75 MG PO TABS
75.0000 mg | ORAL_TABLET | Freq: Every day | ORAL | Status: DC
  Administered 2015-08-26: 75 mg via ORAL
  Filled 2015-08-25: qty 1

## 2015-08-25 MED ORDER — ONDANSETRON HCL 4 MG PO TABS: 4.0000 mg | ORAL_TABLET | Freq: Four times a day (QID) | ORAL | Status: DC | PRN

## 2015-08-25 MED ORDER — ACETAMINOPHEN 325 MG PO TABS: 650.0000 mg | ORAL_TABLET | Freq: Four times a day (QID) | ORAL | Status: DC | PRN

## 2015-08-25 MED ORDER — ASPIRIN 81 MG PO CHEW
324.0000 mg | CHEWABLE_TABLET | Freq: Once | ORAL | Status: AC
  Administered 2015-08-25: 324 mg via ORAL
  Filled 2015-08-25: qty 4

## 2015-08-25 MED ORDER — LEVOTHYROXINE SODIUM 125 MCG PO TABS
250.0000 ug | ORAL_TABLET | Freq: Every day | ORAL | Status: DC
  Administered 2015-08-26: 250 ug via ORAL
  Filled 2015-08-25: qty 2

## 2015-08-25 MED ORDER — BUPROPION HCL 75 MG PO TABS
150.0000 mg | ORAL_TABLET | Freq: Two times a day (BID) | ORAL | Status: DC
  Administered 2015-08-26: 150 mg via ORAL
  Filled 2015-08-25: qty 2

## 2015-08-25 MED ORDER — TRAZODONE HCL 50 MG PO TABS: 50.0000 mg | ORAL_TABLET | Freq: Every day | ORAL | Status: DC

## 2015-08-25 MED ORDER — ASPIRIN EC 81 MG PO TBEC
81.0000 mg | DELAYED_RELEASE_TABLET | Freq: Every day | ORAL | Status: DC
  Administered 2015-08-26: 81 mg via ORAL
  Filled 2015-08-25: qty 1

## 2015-08-25 NOTE — H&P (Addendum)
[facility] NAME: Christina Zamora    MR#:  [id number]  DATE OF BIRTH:  [birth year]  DATE OF ADMISSION:  08/25/2015  PRIMARY CARE PHYSICIAN: Volanda Napoleon, MD   REQUESTING/REFERRING PHYSICIAN: Dr. Brenton Grills  CHIEF COMPLAINT:   Chief Complaint  Patient presents with  . Chest Pain    HISTORY OF PRESENT ILLNESS:  Christina Zamora  is a 54 y.o. female with a known history of hypertension, hyperlipidemia, GERD, osteoarthritis who presents to the hospital complaining of chest pain/left shoulder pain. Patient says she developed some left shoulder and arm pain a few days ago but did not think much of it. Yesterday she started to develop some shortness of breath and also chest tightness. Patient went to work despite the above symptoms and was able to get through her work. This morning she was feeling better and went to work again but at work developed worsening chest pain, shortness of breath. She took a sublingual nitroglycerin which improved her symptoms but they recurred and therefore she came to the ER for further evaluation. Hospitalist services were contacted further treatment and evaluation given her recurrent chest pain. Patient denied any diaphoresis, palpitations, syncope associated with symptoms. She did admit to some nausea, shortness of breath but no other associated symptoms.  PAST MEDICAL HISTORY:   Past Medical History  Diagnosis Date  . Hypertension   . Hyperlipidemia   . GERD (gastroesophageal reflux disease)   . Arthritis     PAST SURGICAL HISTORY:   Past Surgical History  Procedure Laterality Date  . Coronary angioplasty with stent placement      SOCIAL HISTORY:   Social History  Substance Use Topics  . Smoking status: Former Smoker -- 0.50 packs/day for 20 years  . Smokeless tobacco: Not on file  . Alcohol Use: No    FAMILY HISTORY:   Family History  Problem Relation Age of Onset  .  Heart failure Mother   . Diabetes Mother   . Hyperlipidemia Mother   . Heart attack Father     DRUG ALLERGIES:  No Known Allergies  REVIEW OF SYSTEMS:   Review of Systems  Constitutional: Negative for fever and weight loss.  HENT: Negative for congestion, nosebleeds and tinnitus.   Eyes: Negative for blurred vision, double vision and redness.  Respiratory: Positive for shortness of breath. Negative for cough and hemoptysis.   Cardiovascular: Positive for chest pain. Negative for orthopnea, leg swelling and PND.  Gastrointestinal: Negative for nausea, vomiting, abdominal pain, diarrhea and melena.  Genitourinary: Negative for dysuria, urgency and hematuria.  Musculoskeletal: Negative for joint pain and falls.  Neurological: Negative for dizziness, tingling, sensory change, focal weakness, seizures, weakness and headaches.  Endo/Heme/Allergies: Negative for polydipsia. Does not bruise/bleed easily.  Psychiatric/Behavioral: Negative for depression and memory loss. The patient is not nervous/anxious.     MEDICATIONS AT HOME:   Prior to Admission medications   Not on File      VITAL SIGNS:  Blood pressure 117/75, pulse 71, temperature 97.8 F (36.6 C), temperature source Oral, resp. rate 18, height 5\' 4"  (1.626 m), weight 87.091 kg (192 lb), SpO2 98 %.  PHYSICAL EXAMINATION:  Physical Exam  GENERAL:  54 y.o.-year-old patient lying in the bed with no acute distress.  EYES: Pupils equal, round, reactive to light and accommodation. No scleral icterus. Extraocular muscles intact.  HEENT: Head atraumatic, normocephalic. Oropharynx and nasopharynx clear. No oropharyngeal erythema, moist oral mucosa  NECK:  Supple, no jugular venous distention. No thyroid enlargement, no tenderness.  LUNGS: Normal breath sounds bilaterally, no wheezing, rales, rhonchi. No use of accessory muscles of respiration.  CARDIOVASCULAR: S1, S2 RRR. No murmurs, rubs, gallops, clicks.  ABDOMEN: Soft,  nontender, nondistended. Bowel sounds present. No organomegaly or mass.  EXTREMITIES: No pedal edema, cyanosis, or clubbing. + 2 pedal & radial pulses b/l.   NEUROLOGIC: Cranial nerves II through XII are intact. No focal Motor or sensory deficits appreciated b/l PSYCHIATRIC: The patient is alert and oriented x 3. Good affect.  SKIN: No obvious rash, lesion, or ulcer.   LABORATORY PANEL:   CBC  Recent Labs Lab 08/25/15 1150  WBC 6.1  HGB 13.6  HCT 41.4  PLT 231   ------------------------------------------------------------------------------------------------------------------  Chemistries   Recent Labs Lab 08/25/15 1150  NA 142  K 3.7  CL 107  CO2 27  GLUCOSE 127*  BUN 22*  CREATININE 0.78  CALCIUM 9.7   ------------------------------------------------------------------------------------------------------------------  Cardiac Enzymes  Recent Labs Lab 08/25/15 1150  TROPONINI <0.03   ------------------------------------------------------------------------------------------------------------------  RADIOLOGY:  Dg Chest 2 View  08/25/2015  CLINICAL DATA:  Left-sided chest pain for 3 days. History of bronchitis and hypertension. EXAM: CHEST  2 VIEW COMPARISON:  Radiographs 03/30/2013 and 02/18/2014. FINDINGS: The heart size and mediastinal contours are normal. The lungs are clear. There is no pleural effusion or pneumothorax. No acute osseous findings are identified. There is a stable moderate biconvex thoracolumbar scoliosis. IMPRESSION: Stable chest. No active cardiopulmonary process. Moderate scoliosis. Electronically Signed   By: Richardean Sale M.D.   On: 08/25/2015 12:22     IMPRESSION AND PLAN:   54 year old female with past medical history of hypertension, hyperlipidemia, GERD, osteoarthritis, history of coronary disease status post stent placement who presented to the hospital for chest pain.  #1 chest pain-patient does have significant risk factors given  her previous history of coronary disease with stent placement, hypertension, hyperlipidemia. -I will observe him on telemetry, follow serial cardiac markers. -Her EKG does not show any evidence of acute ST or T-wave changes. I will get a cardiology consult. -Continue aspirin, Plavix, nitroglycerin, oxygen, morphine. Follow clinically.  #2 hypertension-continue Norvasc, ramipril  #3 hyperlipidemia-continue atorvastatin.  #4 GERD-continue Protonix.  #5 hypothyroidism-continue Synthroid.  #6 osteoarthritis-continue Celebrex.  #7 depression-continue Wellbutrin    All the records are reviewed and case discussed with ED provider. Management plans discussed with the patient, family and they are in agreement.  CODE STATUS: Full  TOTAL TIME TAKING CARE OF THIS PATIENT: 45 minutes.    Henreitta Leber M.D on 08/25/2015 at 2:05 PM  Between 7am to 6pm - Pager - [phone]  After 6pm go to [email or web address] - password [email or web address] [facility]  Office  [phone]  CC: Primary care physician; Volanda Napoleon, MD

## 2015-08-25 NOTE — ED Notes (Signed)
Pt to ed with c/o chest pain that started intermittently last night,  Pt reports this am worse, sob, diaphoretic, weak, n/v and states she tried nitro to relieve the pain but reports it continues.  On arrival ekg done.

## 2015-08-25 NOTE — ED Provider Notes (Signed)
[facility] Emergency Department Provider Note  ____________________________________________  Time seen: 11:50 AM  I have reviewed the triage vital signs and the nursing notes.   HISTORY  Chief Complaint Chest Pain    HPI Christina Zamora is a 54 y.o. female who complains of intermittent left-sided chest pain that started last night. It lasts for about 2-5 minutes at a time, heaviness radiating to her left arm and associated with shortness of breath and diaphoresis. She's also had a few episodes of vomiting.The pain seems to be worse with exertion. Alleviated by an nitroglycerin. Severe in intensity when present. Denies any recent illness or trauma. Not pleuritic. Has a history of CAD with a stent by her cardiologist Dr. Neoma Laming.  Has been compliant with her medications including Plavix, but does not take aspirin.     Past Medical History  Diagnosis Date  . Hypertension   . Hyperlipidemia   . GERD (gastroesophageal reflux disease)   . Arthritis      There are no active problems to display for this patient.    Past Surgical History  Procedure Laterality Date  . Coronary angioplasty with stent placement       No current outpatient prescriptions on file.   Allergies Review of patient's allergies indicates no known allergies.   History reviewed. No pertinent family history.  Social History Social History  Substance Use Topics  . Smoking status: Never Smoker   . Smokeless tobacco: None  . Alcohol Use: No    Review of Systems  Constitutional:   No fever or chills. No weight changes Eyes:   No blurry vision or double vision.  ENT:   No sore throat. Cardiovascular:   Positive as above chest pain. Respiratory:   No dyspnea or cough. Gastrointestinal:   Negative for abdominal pain, vomiting and diarrhea.  No BRBPR or melena. Genitourinary:   Negative for dysuria, urinary retention, bloody urine, or difficulty  urinating. Musculoskeletal:   Negative for back pain. No joint swelling or pain. Skin:   Negative for rash. Neurological:   Negative for headaches, focal weakness or numbness. Psychiatric:  No anxiety or depression.   Endocrine:  No hot/cold intolerance, changes in energy, or sleep difficulty.  10-point ROS otherwise negative.  ____________________________________________   PHYSICAL EXAM:  VITAL SIGNS: ED Triage Vitals  Enc Vitals Group     BP 08/25/15 1138 124/67 mmHg     Pulse Rate 08/25/15 1138 80     Resp 08/25/15 1138 20     Temp 08/25/15 1138 97.8 F (36.6 C)     Temp Source 08/25/15 1138 Oral     SpO2 08/25/15 1138 98 %     Weight 08/25/15 1138 192 lb (87.091 kg)     Height 08/25/15 1138 5\' 4"  (1.626 m)     Head Cir --      Peak Flow --      Pain Score 08/25/15 1139 6     Pain Loc --      Pain Edu? --      Excl. in [place]? --      Constitutional:   Alert and oriented. Well appearing and in no distress. Eyes:   No scleral icterus. No conjunctival pallor. PERRL. EOMI ENT   Head:   Normocephalic and atraumatic.   Nose:   No congestion/rhinnorhea. No septal hematoma   Mouth/Throat:   MMM, no pharyngeal erythema. No peritonsillar mass. No uvula shift.   Neck:   No stridor. No SubQ  emphysema. No meningismus. Hematological/Lymphatic/Immunilogical:   No cervical lymphadenopathy. Cardiovascular:   RRR. Normal and symmetric distal pulses are present in all extremities. No murmurs, rubs, or gallops. Respiratory:   Normal respiratory effort without tachypnea nor retractions. Breath sounds are clear and equal bilaterally. No wheezes/rales/rhonchi. Gastrointestinal:   Soft and nontender. No distention. There is no CVA tenderness.  No rebound, rigidity, or guarding. Genitourinary:   deferred Musculoskeletal:   Nontender with normal range of motion in all extremities. No joint effusions.  No lower extremity tenderness.  No edema. Neurologic:   Normal speech and  language.  CN 2-10 normal. Motor grossly intact. No pronator drift.  Normal gait. No gross focal neurologic deficits are appreciated.  Skin:    Skin is warm, dry and intact. No rash noted.  No petechiae, purpura, or bullae. Psychiatric:   Mood and affect are normal. Speech and behavior are normal. Patient exhibits appropriate insight and judgment.  ____________________________________________    LABS (pertinent positives/negatives) (all labs ordered are listed, but only abnormal results are displayed) Labs Reviewed  BASIC METABOLIC PANEL - Abnormal; Notable for the following:    Glucose, Bld 127 (*)    BUN 22 (*)    All other components within normal limits  TROPONIN I  CBC   ____________________________________________   EKG  Interpreted by me Normal sinus rhythm rate of 84, normal axis and intervals. Poor R-wave progression in anterior precordial leads. Normal ST segments and T waves.  ____________________________________________    RADIOLOGY  Chest x-ray unremarkable  ____________________________________________   PROCEDURES   ____________________________________________   INITIAL IMPRESSION / ASSESSMENT AND PLAN / ED COURSE  Pertinent labs & imaging results that were available during my care of the patient were reviewed by me and considered in my medical decision making (see chart for details).  Patient presents with exertional chest pain with multiple associated symptoms concerning for cardiac ischemia. Chest pain currently resolved due to her nitroglycerin use on the way over. Vital signs unremarkable. We'll give her a full dose chewable aspirin now and plan for hospitalization for telemetry monitoring and serial troponins given her significant underlying disease and concerning symptoms and lack of recent cardiac workup.     ____________________________________________   FINAL CLINICAL IMPRESSION(S) / ED DIAGNOSES  Final diagnoses:  Ischemic chest  pain [facility])      Carrie Mew, MD 08/25/15 1301

## 2015-08-26 ENCOUNTER — Observation Stay
Admit: 2015-08-26 | Discharge: 2015-08-26 | Disposition: A | Discharge status: Home / Self Care | Payer: BLUE CROSS/BLUE SHIELD | Attending: Physician Assistant | Admitting: Physician Assistant

## 2015-08-26 LAB — TROPONIN I: Troponin I: <0.03 ng/mL (ref <0.031)

## 2015-08-26 MED ORDER — ASPIRIN 81 MG PO TBEC: 81.0000 mg | DELAYED_RELEASE_TABLET | Freq: Every day | ORAL | Status: DC

## 2015-08-26 MED ORDER — NITROGLYCERIN 0.4 MG SL SUBL: 0.4000 mg | SUBLINGUAL_TABLET | SUBLINGUAL | Status: AC | PRN

## 2015-08-26 NOTE — Discharge Summary (Signed)
[facility] NAME: Christina Zamora    MR#:  [id number]  DATE OF BIRTH:  [birth year]  DATE OF ADMISSION:  08/25/2015 ADMITTING PHYSICIAN: Henreitta Leber, MD  DATE OF DISCHARGE: No discharge date for patient encounter.  PRIMARY CARE PHYSICIAN: Volanda Napoleon, MD    ADMISSION DIAGNOSIS:  Ischemic chest pain ([place]) [I20.9]  DISCHARGE DIAGNOSIS:  Active Problems:   Chest pain   SECONDARY DIAGNOSIS:   Past Medical History  Diagnosis Date  . Hypertension   . Hyperlipidemia   . GERD (gastroesophageal reflux disease)   . Arthritis     HOSPITAL COURSE:   54 year old female with past medical history of hypertension, hyperlipidemia, GERD, osteoarthritis, history of coronary disease status post stent placement who presented to the hospital for chest pain.  #1 chest pain-patient does have significant risk factors given her previous history of coronary disease with stent placement, hypertension, hyperlipidemia and noncompliance with her follow-up visits -  ruled out acute MI with 3 negative sets of cardiac enzymes Her EKG does not show any evidence of acute ST or T-wave changes.  -Continue aspirin, Plavix, nitroglycerin, and statin  per cardiology recommendations  -Echo with ejection fraction 55% - Dr. Humphrey Rolls, cardiologist has recommended outpatient nuclear stress test on December 5, tomorrow at 8:30 AM at his clinic -Reinforced the patient being compliant with her follow-up visits and medications, she verbalized understanding   #2 hypertension-continue Norvasc, ramipril  #3 hyperlipidemia-continue atorvastatin.  #4 GERD-continue Protonix.  #5 hypothyroidism-continue Synthroid.  #6 osteoarthritis-continue Celebrex.  #7 depression-continue Wellbutrin  DISCHARGE CONDITIONS:   Fair  CONSULTS OBTAINED:  Treatment Team:  Dionisio David, MD Barnabas Harries, PA-C   PROCEDURES none  DRUG ALLERGIES:  No Known  Allergies  DISCHARGE MEDICATIONS:   Current Discharge Medication List    START taking these medications   Details  aspirin EC 81 MG EC tablet Take 1 tablet (81 mg total) by mouth daily. Qty: 30 tablet, Refills: 0    nitroGLYCERIN (NITROSTAT) 0.4 MG SL tablet Place 1 tablet (0.4 mg total) under the tongue every 5 (five) minutes as needed for chest pain. Qty: 10 tablet, Refills: 0      CONTINUE these medications which have NOT CHANGED   Details  amLODipine (NORVASC) 10 MG tablet Take 10 mg by mouth daily.    atorvastatin (LIPITOR) 80 MG tablet Take 80 mg by mouth daily.    buPROPion (WELLBUTRIN) 75 MG tablet Take 150 mg by mouth 2 (two) times daily.    celecoxib (CELEBREX) 200 MG capsule Take 200 mg by mouth every morning.    clopidogrel (PLAVIX) 75 MG tablet Take 75 mg by mouth daily.    levothyroxine (SYNTHROID, LEVOTHROID) 200 MCG tablet Take 250 mcg by mouth daily before breakfast.    pantoprazole (PROTONIX) 40 MG tablet Take 40 mg by mouth daily.    ramipril (ALTACE) 10 MG capsule Take 10 mg by mouth daily.    traZODone (DESYREL) 50 MG tablet Take 50 mg by mouth at bedtime.         DISCHARGE INSTRUCTIONS:   Activity as tolerated, no strenuous exercise until seen by cardiology tomorrow Diet healthy heart Follow-up with cardiology Dr. Humphrey Rolls tomorrow a.m. at 8:30 for outpatient stress test Follow-up with primary care physician in a week  DIET:  Cardiac diet  DISCHARGE CONDITION:  Fair  ACTIVITY:  Activity as tolerated  OXYGEN:  Home Oxygen: No.   Oxygen Delivery: room air  DISCHARGE LOCATION:  home   If you experience worsening of your admission symptoms, develop shortness of breath, life threatening emergency, suicidal or homicidal thoughts you must seek medical attention immediately by calling 911 or calling your MD immediately  if symptoms less severe.  You Must read complete instructions/literature along with all the possible adverse reactions/side  effects for all the Medicines you take and that have been prescribed to you. Take any new Medicines after you have completely understood and accpet all the possible adverse reactions/side effects.   Please note  You were cared for by a hospitalist during your hospital stay. If you have any questions about your discharge medications or the care you received while you were in the hospital after you are discharged, you can call the unit and asked to speak with the hospitalist on call if the hospitalist that took care of you is not available. Once you are discharged, your primary care physician will handle any further medical issues. Please note that NO REFILLS for any discharge medications will be authorized once you are discharged, as it is imperative that you return to your primary care physician (or establish a relationship with a primary care physician if you do not have one) for your aftercare needs so that they can reassess your need for medications and monitor your lab values.     Today  Chief Complaint  Patient presents with  . Chest Pain   Patient is resting comfortably. Denies any chest pain or shortness of breath. Feels comfortable to be discharged.  ROS: CONSTITUTIONAL: Denies fevers, chills. Denies any fatigue, weakness.  EYES: Denies blurry vision, double vision, eye pain. EARS, NOSE, THROAT: Denies tinnitus, ear pain, hearing loss. RESPIRATORY: Denies cough, wheeze, shortness of breath.  CARDIOVASCULAR: Denies chest pain, palpitations, edema.  GASTROINTESTINAL: Denies nausea, vomiting, diarrhea, abdominal pain. Denies bright red blood per rectum. GENITOURINARY: Denies dysuria, hematuria. ENDOCRINE: Denies nocturia or thyroid problems. HEMATOLOGIC AND LYMPHATIC: Denies easy bruising or bleeding. SKIN: Denies rash or lesion. MUSCULOSKELETAL: Denies pain in neck, back, shoulder, knees, hips or arthritic symptoms.  NEUROLOGIC: Denies paralysis, paresthesias.  PSYCHIATRIC: Denies  anxiety or depressive symptoms.   VITAL SIGNS:  Blood pressure 119/68, pulse 76, temperature 98.2 F (36.8 C), temperature source Oral, resp. rate 18, height 5\' 4"  (1.626 m), weight 87.998 kg (194 lb), SpO2 100 %.  I/O:   Intake/Output Summary (Last 24 hours) at 08/26/15 1201 Last data filed at 08/26/15 0830  Gross per 24 hour  Intake    240 ml  Output      0 ml  Net    240 ml    PHYSICAL EXAMINATION:  GENERAL:  54 y.o.-year-old patient lying in the bed with no acute distress.  EYES: Pupils equal, round, reactive to light and accommodation. No scleral icterus. Extraocular muscles intact.  HEENT: Head atraumatic, normocephalic. Oropharynx and nasopharynx clear.  NECK:  Supple, no jugular venous distention. No thyroid enlargement, no tenderness.  LUNGS: Normal breath sounds bilaterally, no wheezing, rales,rhonchi or crepitation. No use of accessory muscles of respiration.  CARDIOVASCULAR: S1, S2 normal. No murmurs, rubs, or gallops.  ABDOMEN: Soft, non-tender, non-distended. Bowel sounds present. No organomegaly or mass.  EXTREMITIES: No pedal edema, cyanosis, or clubbing.  NEUROLOGIC: Cranial nerves II through XII are intact. Muscle strength 5/5 in all extremities. Sensation intact. Gait not checked.  PSYCHIATRIC: The patient is alert and oriented x 3.  SKIN: No obvious rash, lesion, or ulcer.   DATA REVIEW:   CBC  Recent  Labs Lab 08/25/15 1530  WBC 5.2  HGB 12.6  HCT 37.7  PLT 217    Chemistries   Recent Labs Lab 08/25/15 1150 08/25/15 1530  NA 142  --   K 3.7  --   CL 107  --   CO2 27  --   GLUCOSE 127*  --   BUN 22*  --   CREATININE 0.78 0.71  CALCIUM 9.7  --     Cardiac Enzymes  Recent Labs Lab 08/25/15 2212  TROPONINI <0.03    Microbiology Results  Results for orders placed or performed in visit on 08/30/14  MRSA PCR Screening     Status: None   Collection Time: 08/30/14  8:38 AM  Result Value Ref Range Status   Micro Text Report   Final        COMMENT                   NEGATIVE - MRSA target DNA not detected   ANTIBIOTIC                                                        RADIOLOGY:  Dg Chest 2 View  08/25/2015  CLINICAL DATA:  Left-sided chest pain for 3 days. History of bronchitis and hypertension. EXAM: CHEST  2 VIEW COMPARISON:  Radiographs 03/30/2013 and 02/18/2014. FINDINGS: The heart size and mediastinal contours are normal. The lungs are clear. There is no pleural effusion or pneumothorax. No acute osseous findings are identified. There is a stable moderate biconvex thoracolumbar scoliosis. IMPRESSION: Stable chest. No active cardiopulmonary process. Moderate scoliosis. Electronically Signed   By: Richardean Sale M.D.   On: 08/25/2015 12:22    EKG:   Orders placed or performed during the hospital encounter of 08/25/15  . ED EKG within 10 minutes  . ED EKG within 10 minutes      Management plans discussed with the patient, family and they are in agreement.  CODE STATUS:     Code Status Orders        Start     Ordered   08/25/15 1517  Full code   Continuous     08/25/15 1516      TOTAL TIME TAKING CARE OF THIS PATIENT: 41 minutes.    @[facility] @  on 08/26/2015 at 12:01 PM  Between 7am to 6pm - Pager - [phone]  After 6pm go to [email or web address] - password [email or web address] [facility]  Office  [phone]  CC: Primary care physician; Volanda Napoleon, MD

## 2015-08-26 NOTE — Progress Notes (Signed)
*  PRELIMINARY RESULTS* Echocardiogram 2D Echocardiogram has been performed.  Christina Zamora 08/26/2015, 8:04 AM

## 2015-08-26 NOTE — Discharge Instructions (Signed)
Activity as tolerated, no strenuous exercise until seen by cardiology tomorrow Diet healthy heart Follow-up with cardiology Dr. Humphrey Rolls tomorrow a.m. at 8:30 for outpatient stress test Follow-up with primary care physician in a week

## 2015-08-26 NOTE — Progress Notes (Signed)
Summoned to pt room. She c/o 6/10. Relief with 1 ntg s.l. Dr. Laurelyn Sickle p.a.  notified

## 2015-08-26 NOTE — Consult Note (Signed)
Primary Cardiologist: Dr. Neoma Laming    Reason for Consultation : Chest pain    HPI : This is a 5yoF with PMHx CAD s/p PCI to RCA, HTN, HL, and GERD who presented to ER yesterday c/o CP. She describes pain as tightness with concurrent SOB. CP was relieved by SL nitro but shortly returned, thus pt came to ER. Today she states CP just occurred, but was mild and relieved by SL nitro.        Review of Systems: General: negative for chills, fever, night sweats or weight changes.  Cardiovascular: positive for chest pain, edema, orthopnea, palpitations, paroxysmal nocturnal dyspnea, shortness of breath or dyspnea on exertion Dermatological: negative for rash Respiratory: negative for cough or wheezing Urologic: negative for hematuria Abdominal: negative for nausea, vomiting, diarrhea, bright red blood per rectum, melena, or hematemesis Neurologic: negative for visual changes, syncope, or dizziness All other systems reviewed and are otherwise negative except as noted above.    Past Medical History  Diagnosis Date  . Hypertension   . Hyperlipidemia   . GERD (gastroesophageal reflux disease)   . Arthritis     Medications Prior to Admission  Medication Sig Dispense Refill  . amLODipine (NORVASC) 10 MG tablet Take 10 mg by mouth daily.    Marland Kitchen atorvastatin (LIPITOR) 80 MG tablet Take 80 mg by mouth daily.    Marland Kitchen buPROPion (WELLBUTRIN) 75 MG tablet Take 150 mg by mouth 2 (two) times daily.    . celecoxib (CELEBREX) 200 MG capsule Take 200 mg by mouth every morning.    . clopidogrel (PLAVIX) 75 MG tablet Take 75 mg by mouth daily.    Marland Kitchen levothyroxine (SYNTHROID, LEVOTHROID) 200 MCG tablet Take 250 mcg by mouth daily before breakfast.    . pantoprazole (PROTONIX) 40 MG tablet Take 40 mg by mouth daily.    . ramipril (ALTACE) 10 MG capsule Take 10 mg by mouth daily.    . traZODone (DESYREL) 50 MG tablet Take 50 mg by mouth at bedtime.       Marland Kitchen amLODipine  10 mg Oral Daily  . aspirin EC   81 mg Oral Daily  . atorvastatin  80 mg Oral Daily  . buPROPion  150 mg Oral BID  . celecoxib  200 mg Oral q morning - 10a  . clopidogrel  75 mg Oral Daily  . enoxaparin (LOVENOX) injection  40 mg Subcutaneous Q24H  . levothyroxine  250 mcg Oral QAC breakfast  . pantoprazole  40 mg Oral Daily  . ramipril  10 mg Oral Daily  . sodium chloride  3 mL Intravenous Q12H  . traZODone  50 mg Oral QHS    Infusions:    No Known Allergies  Social History   Social History  . Marital Status: Married    Spouse Name: N/A  . Number of Children: N/A  . Years of Education: N/A   Occupational History  . Not on file.   Social History Main Topics  . Smoking status: Former Smoker -- 0.50 packs/day for 20 years  . Smokeless tobacco: Not on file  . Alcohol Use: No  . Drug Use: No  . Sexual Activity: Not on file   Other Topics Concern  . Not on file   Social History Narrative    Family History  Problem Relation Age of Onset  . Heart failure Mother   . Diabetes Mother   . Hyperlipidemia Mother   . Heart attack Father     PHYSICAL  EXAM: Filed Vitals:   08/26/15 1050 08/26/15 1110  BP: 112/72 119/68  Pulse:  76  Temp:  98.2 F (36.8 C)  Resp:  18     Intake/Output Summary (Last 24 hours) at 08/26/15 1125 Last data filed at 08/26/15 0830  Gross per 24 hour  Intake    240 ml  Output      0 ml  Net    240 ml    General:  Well appearing. No respiratory difficulty HEENT: normal Neck: supple. no JVD. Carotids 2+ bilat; no bruits. No lymphadenopathy or thryomegaly appreciated. Cor: PMI nondisplaced. Regular rate & rhythm. No rubs, gallops or murmurs. Lungs: clear Abdomen: soft, nontender, nondistended. No hepatosplenomegaly. No bruits or masses. Good bowel sounds. Extremities: no cyanosis, clubbing, rash, edema Neuro: alert & oriented x 3, cranial nerves grossly intact. moves all 4 extremities w/o difficulty. Affect pleasant.  ECG: NSR 84 BPM NS ST/T changes    Results  for orders placed or performed during the hospital encounter of 08/25/15 (from the past 24 hour(s))  Basic metabolic panel     Status: Abnormal   Collection Time: 08/25/15 11:50 AM  Result Value Ref Range   Sodium 142 135 - 145 mmol/L   Potassium 3.7 3.5 - 5.1 mmol/L   Chloride 107 101 - 111 mmol/L   CO2 27 22 - 32 mmol/L   Glucose, Bld 127 (H) 65 - 99 mg/dL   BUN 22 (H) 6 - 20 mg/dL   Creatinine, Ser 0.78 0.44 - 1.00 mg/dL   Calcium 9.7 8.9 - 10.3 mg/dL   GFR calc non Af Amer >60 >60 mL/min   GFR calc Af Amer >60 >60 mL/min   Anion gap 8 5 - 15  Troponin I     Status: None   Collection Time: 08/25/15 11:50 AM  Result Value Ref Range   Troponin I <0.03 <0.031 ng/mL  CBC     Status: None   Collection Time: 08/25/15 11:50 AM  Result Value Ref Range   WBC 6.1 3.6 - 11.0 K/uL   RBC 4.98 3.80 - 5.20 MIL/uL   Hemoglobin 13.6 12.0 - 16.0 g/dL   HCT 41.4 35.0 - 47.0 %   MCV 83.2 80.0 - 100.0 fL   MCH 27.3 26.0 - 34.0 pg   MCHC 32.8 32.0 - 36.0 g/dL   RDW 14.1 11.5 - 14.5 %   Platelets 231 150 - 440 K/uL  CBC     Status: None   Collection Time: 08/25/15  3:30 PM  Result Value Ref Range   WBC 5.2 3.6 - 11.0 K/uL   RBC 4.54 3.80 - 5.20 MIL/uL   Hemoglobin 12.6 12.0 - 16.0 g/dL   HCT 37.7 35.0 - 47.0 %   MCV 83.0 80.0 - 100.0 fL   MCH 27.8 26.0 - 34.0 pg   MCHC 33.5 32.0 - 36.0 g/dL   RDW 14.0 11.5 - 14.5 %   Platelets 217 150 - 440 K/uL  Creatinine, serum     Status: None   Collection Time: 08/25/15  3:30 PM  Result Value Ref Range   Creatinine, Ser 0.71 0.44 - 1.00 mg/dL   GFR calc non Af Amer >60 >60 mL/min   GFR calc Af Amer >60 >60 mL/min  Troponin I     Status: None   Collection Time: 08/25/15  3:30 PM  Result Value Ref Range   Troponin I <0.03 <0.031 ng/mL  Troponin I     Status: None  Collection Time: 08/25/15  7:02 PM  Result Value Ref Range   Troponin I <0.03 <0.031 ng/mL  Troponin I     Status: None   Collection Time: 08/25/15 10:12 PM  Result Value Ref  Range   Troponin I <0.03 <0.031 ng/mL   Dg Chest 2 View  08/25/2015  CLINICAL DATA:  Left-sided chest pain for 3 days. History of bronchitis and hypertension. EXAM: CHEST  2 VIEW COMPARISON:  Radiographs 03/30/2013 and 02/18/2014. FINDINGS: The heart size and mediastinal contours are normal. The lungs are clear. There is no pleural effusion or pneumothorax. No acute osseous findings are identified. There is a stable moderate biconvex thoracolumbar scoliosis. IMPRESSION: Stable chest. No active cardiopulmonary process. Moderate scoliosis. Electronically Signed   By: Richardean Sale M.D.   On: 08/25/2015 12:22     ASSESSMENT: Chest pain   PLAN/DISCUSSION: Pt with PMHx CAD s/p PCI to RCA. Pt has missed several appointments in our office, has not been since since Dec 2015. No significant EKG changes, troponin negative x3. Pt ok to go home with outpatient NST scheduled for tomorrow at 8:30am.    Patient and plan discussed with supervising provider, Dr. Neoma Laming, who agrees with above findings.   Kelby Fam Nuckolls, [place] 08/26/2015 11:25 AM

## 2015-11-13 ENCOUNTER — Encounter (INDEPENDENT_AMBULATORY_CARE_PROVIDER_SITE_OTHER): Payer: Self-pay

## 2015-11-13 ENCOUNTER — Other Ambulatory Visit: Payer: Self-pay | Admitting: Anesthesiology

## 2015-11-13 ENCOUNTER — Encounter: Payer: Self-pay | Admitting: Anesthesiology

## 2015-11-13 ENCOUNTER — Ambulatory Visit: Payer: BLUE CROSS/BLUE SHIELD | Attending: Anesthesiology | Admitting: Anesthesiology

## 2015-11-13 VITALS — BP 117/75 | HR 86 | Temp 98.1°F | Resp 14 | Ht 63.0 in | Wt 192.0 lb

## 2015-11-13 DIAGNOSIS — M4687 Other specified inflammatory spondylopathies, lumbosacral region: Secondary | ICD-10-CM | POA: Diagnosis not present

## 2015-11-13 DIAGNOSIS — M5431 Sciatica, right side: Secondary | ICD-10-CM

## 2015-11-13 DIAGNOSIS — M419 Scoliosis, unspecified: Secondary | ICD-10-CM | POA: Diagnosis not present

## 2015-11-13 DIAGNOSIS — M5432 Sciatica, left side: Secondary | ICD-10-CM | POA: Insufficient documentation

## 2015-11-13 DIAGNOSIS — Z87891 Personal history of nicotine dependence: Secondary | ICD-10-CM | POA: Diagnosis not present

## 2015-11-13 DIAGNOSIS — E559 Vitamin D deficiency, unspecified: Secondary | ICD-10-CM | POA: Diagnosis not present

## 2015-11-13 DIAGNOSIS — Z955 Presence of coronary angioplasty implant and graft: Secondary | ICD-10-CM | POA: Diagnosis not present

## 2015-11-13 DIAGNOSIS — I10 Essential (primary) hypertension: Secondary | ICD-10-CM | POA: Diagnosis not present

## 2015-11-13 DIAGNOSIS — M549 Dorsalgia, unspecified: Secondary | ICD-10-CM | POA: Diagnosis present

## 2015-11-13 DIAGNOSIS — M5136 Other intervertebral disc degeneration, lumbar region: Secondary | ICD-10-CM | POA: Insufficient documentation

## 2015-11-13 DIAGNOSIS — K219 Gastro-esophageal reflux disease without esophagitis: Secondary | ICD-10-CM | POA: Diagnosis not present

## 2015-11-13 DIAGNOSIS — M47817 Spondylosis without myelopathy or radiculopathy, lumbosacral region: Secondary | ICD-10-CM

## 2015-11-13 DIAGNOSIS — E785 Hyperlipidemia, unspecified: Secondary | ICD-10-CM | POA: Diagnosis not present

## 2015-11-13 DIAGNOSIS — I2510 Atherosclerotic heart disease of native coronary artery without angina pectoris: Secondary | ICD-10-CM | POA: Diagnosis not present

## 2015-11-13 DIAGNOSIS — F329 Major depressive disorder, single episode, unspecified: Secondary | ICD-10-CM | POA: Diagnosis not present

## 2015-11-13 DIAGNOSIS — E039 Hypothyroidism, unspecified: Secondary | ICD-10-CM | POA: Diagnosis not present

## 2015-11-13 MED ORDER — TRAMADOL HCL 50 MG PO TABS: 50.0000 mg | ORAL_TABLET | Freq: Four times a day (QID) | ORAL | Status: DC

## 2015-11-13 NOTE — Patient Instructions (Addendum)
Epidural Steroid Injection Patient Information  Description: The epidural space surrounds the nerves as they exit the spinal cord.  In some patients, the nerves can be compressed and inflamed by a bulging disc or a tight spinal canal (spinal stenosis).  By injecting steroids into the epidural space, we can bring irritated nerves into direct contact with a potentially helpful medication.  These steroids act directly on the irritated nerves and can reduce swelling and inflammation which often leads to decreased pain.  Epidural steroids may be injected anywhere along the spine and from the neck to the low back depending upon the location of your pain.   After numbing the skin with local anesthetic (like Novocaine), a small needle is passed into the epidural space slowly.  You may experience a sensation of pressure while this is being done.  The entire block usually last less than 10 minutes.  Conditions which may be treated by epidural steroids:   Low back and leg pain  Neck and arm pain  Spinal stenosis  Post-laminectomy syndrome  Herpes zoster (shingles) pain  Pain from compression fractures  Preparation for the injection:  1. Do not eat any solid food or dairy products within 6 hours of your appointment.  2. You may drink clear liquids up to 2 hours before appointment.  Clear liquids include water, black coffee, juice or soda.  No milk or cream please. 3. You may take your regular medication, including pain medications, with a sip of water before your appointment  Diabetics should hold regular insulin (if taken separately) and take 1/2 normal NPH dos the morning of the procedure.  Carry some sugar containing items with you to your appointment. 4. A driver must accompany you and be prepared to drive you home after your procedure.  5. Bring all your current medications with your. 6. An IV may be inserted and sedation may be given at the discretion of the physician.   7. A blood pressure  cuff, EKG and other monitors will often be applied during the procedure.  Some patients may need to have extra oxygen administered for a short period. 8. You will be asked to provide medical information, including your allergies, prior to the procedure.  We must know immediately if you are taking blood thinners (like Coumadin/Warfarin)  Or if you are allergic to IV iodine contrast (dye). We must know if you could possible be pregnant.  Possible side-effects:  Bleeding from needle site  Infection (rare, may require surgery)  Nerve injury (rare)  Numbness & tingling (temporary)  Difficulty urinating (rare, temporary)  Spinal headache ( a headache worse with upright posture)  Light -headedness (temporary)  Pain at injection site (several days)  Decreased blood pressure (temporary)  Weakness in arm/leg (temporary)  Pressure sensation in back/neck (temporary)  Call if you experience:  Fever/chills associated with headache or increased back/neck pain.  Headache worsened by an upright position.  New onset weakness or numbness of an extremity below the injection site  Hives or difficulty breathing (go to the emergency room)  Inflammation or drainage at the infection site  Severe back/neck pain  Any new symptoms which are concerning to you  Please note:  Although the local anesthetic injected can often make your back or neck feel good for several hours after the injection, the pain will likely return.  It takes 3-7 days for steroids to work in the epidural space.  You may not notice any pain relief for at least that one week.    If effective, we will often do a series of three injections spaced 3-6 weeks apart to maximally decrease your pain.  After the initial series, we generally will wait several months before considering a repeat injection of the same type.  If you have any questions, please call [phone] [facility]  Discharge Instructions  General Discharge Instructions :  If you need to reach your doctor call: Monday-Friday 8:00 am - 4:00 pm at [phone] or toll free [phone].  After clinic hours [phone] to have operator reach doctor.  Bring all of your medication bottles to all your appointments in the pain clinic.  To cancel or reschedule your appointment with Pain Management please remember to call 24 hours in advance to avoid a fee.  Refer to the educational materials which you have been given on: General Risks, I had my Procedure. Discharge Instructions, Post Sedation.  Post Procedure Instructions:  The drugs you were given will stay in your system until tomorrow, so for the next 24 hours you should not drive, make any legal decisions or drink any alcoholic beverages.  You may eat anything you prefer, but it is better to start with liquids then soups and crackers, and gradually work up to solid foods.  Please notify your doctor immediately if you have any unusual bleeding, trouble breathing or pain that is not related to your normal pain.  Depending on the type of procedure that was done, some parts of your body may feel week and/or numb.  This usually clears up by tonight or the next day.  Walk with the use of an assistive device or accompanied by an adult for the 24 hours.  You may use ice on the affected area for the first 24 hours.  Put ice in a Ziploc bag and cover with a towel and place against area 15 minutes on 15 minutes off.  You may switch to heat after 24 hours.GENERAL RISKS AND COMPLICATIONS  What are the risk, side effects and possible complications? Generally speaking, most procedures are safe.  However, with any procedure there are risks, side effects, and the possibility of complications.  The risks and complications are dependent upon the sites that are lesioned, or the type of nerve block to be performed.  The closer the procedure is to the spine, the more  serious the risks are.  Great care is taken when placing the radio frequency needles, block needles or lesioning probes, but sometimes complications can occur. 1. Infection: Any time there is an injection through the skin, there is a risk of infection.  This is why sterile conditions are used for these blocks.  There are four possible types of infection. 1. Localized skin infection. 2. Central Nervous System Infection-This can be in the form of Meningitis, which can be deadly. 3. Epidural Infections-This can be in the form of an epidural abscess, which can cause pressure inside of the spine, causing compression of the spinal cord with subsequent paralysis. This would require an emergency surgery to decompress, and there are no guarantees that the patient would recover from the paralysis. 4. Discitis-This is an infection of the intervertebral discs.  It occurs in about 1% of discography procedures.  It is difficult to treat and it may lead to surgery.        2. Pain: the needles have to go through skin and soft tissues, will cause soreness.       3. Damage to internal structures:  The nerves  to be lesioned may be near blood vessels or    other nerves which can be potentially damaged.       4. Bleeding: Bleeding is more common if the patient is taking blood thinners such as  aspirin, Coumadin, Ticiid, Plavix, etc., or if he/she have some genetic predisposition  such as hemophilia. Bleeding into the spinal canal can cause compression of the spinal  cord with subsequent paralysis.  This would require an emergency surgery to  decompress and there are no guarantees that the patient would recover from the  paralysis.       5. Pneumothorax:  Puncturing of a lung is a possibility, every time a needle is introduced in  the area of the chest or upper back.  Pneumothorax refers to free air around the  collapsed lung(s), inside of the thoracic cavity (chest cavity).  Another two possible  complications related to a  similar event would include: Hemothorax and Chylothorax.   These are variations of the Pneumothorax, where instead of air around the collapsed  lung(s), you may have blood or chyle, respectively.       6. Spinal headaches: They may occur with any procedures in the area of the spine.       7. Persistent CSF (Cerebro-Spinal Fluid) leakage: This is a rare problem, but may occur  with prolonged intrathecal or epidural catheters either due to the formation of a fistulous  track or a dural tear.       8. Nerve damage: By working so close to the spinal cord, there is always a possibility of  nerve damage, which could be as serious as a permanent spinal cord injury with  paralysis.       9. Death:  Although rare, severe deadly allergic reactions known as "Anaphylactic  reaction" can occur to any of the medications used.      10. Worsening of the symptoms:  We can always make thing worse.  What are the chances of something like this happening? Chances of any of this occuring are extremely low.  By statistics, you have more of a chance of getting killed in a motor vehicle accident: while driving to the hospital than any of the above occurring .  Nevertheless, you should be aware that they are possibilities.  In general, it is similar to taking a shower.  Everybody knows that you can slip, hit your head and get killed.  Does that mean that you should not shower again?  Nevertheless always keep in mind that statistics do not mean anything if you happen to be on the wrong side of them.  Even if a procedure has a 1 (one) in a 1,000,000 (million) chance of going wrong, it you happen to be that one..Also, keep in mind that by statistics, you have more of a chance of having something go wrong when taking medications.  Who should not have this procedure? If you are on a blood thinning medication (e.g. Coumadin, Plavix, see list of "Blood Thinners"), or if you have an active infection going on, you should not have the  procedure.  If you are taking any blood thinners, please inform your physician.  How should I prepare for this procedure?  Do not eat or drink anything at least six hours prior to the procedure.  Bring a driver with you .  It cannot be a taxi.  Come accompanied by an adult that can drive you back, and that is strong enough to help you if  your legs get weak or numb from the local anesthetic.  Take all of your medicines the morning of the procedure with just enough water to swallow them.  If you have diabetes, make sure that you are scheduled to have your procedure done first thing in the morning, whenever possible.  If you have diabetes, take only half of your insulin dose and notify our nurse that you have done so as soon as you arrive at the clinic.  If you are diabetic, but only take blood sugar pills (oral hypoglycemic), then do not take them on the morning of your procedure.  You may take them after you have had the procedure.  Do not take aspirin or any aspirin-containing medications, at least eleven (11) days prior to the procedure.  They may prolong bleeding.  Wear loose fitting clothing that may be easy to take off and that you would not mind if it got stained with Betadine or blood.  Do not wear any jewelry or perfume  Remove any nail coloring.  It will interfere with some of our monitoring equipment.  NOTE: Remember that this is not meant to be interpreted as a complete list of all possible complications.  Unforeseen problems may occur.  BLOOD THINNERS The following drugs contain aspirin or other products, which can cause increased bleeding during surgery and should not be taken for 2 weeks prior to and 1 week after surgery.  If you should need take something for relief of minor pain, you may take acetaminophen which is found in Tylenol,m Datril, Anacin-3 and Panadol. It is not blood thinner. The products listed below are.  Do not take any of the products listed below in  addition to any listed on your instruction sheet.  A.P.C or A.P.C with Codeine Codeine Phosphate Capsules #3 Ibuprofen Ridaura  ABC compound Congesprin Imuran rimadil  Advil Cope Indocin Robaxisal  Alka-Seltzer Effervescent Pain Reliever and Antacid Coricidin or Coricidin-D  Indomethacin Rufen  Alka-Seltzer plus Cold Medicine Cosprin Ketoprofen S-A-C Tablets  Anacin Analgesic Tablets or Capsules Coumadin Korlgesic Salflex  Anacin Extra Strength Analgesic tablets or capsules CP-2 Tablets Lanoril Salicylate  Anaprox Cuprimine Capsules Levenox Salocol  Anexsia-D Dalteparin Magan Salsalate  Anodynos Darvon compound Magnesium Salicylate Sine-off  Ansaid Dasin Capsules Magsal Sodium Salicylate  Anturane Depen Capsules Marnal Soma  APF Arthritis pain formula Dewitt's Pills Measurin Stanback  Argesic Dia-Gesic Meclofenamic Sulfinpyrazone  Arthritis Bayer Timed Release Aspirin Diclofenac Meclomen Sulindac  Arthritis pain formula Anacin Dicumarol Medipren Supac  Analgesic (Safety coated) Arthralgen Diffunasal Mefanamic Suprofen  Arthritis Strength Bufferin Dihydrocodeine Mepro Compound Suprol  Arthropan liquid Dopirydamole Methcarbomol with Aspirin Synalgos  ASA tablets/Enseals Disalcid Micrainin Tagament  Ascriptin Doan's Midol Talwin  Ascriptin A/D Dolene Mobidin Tanderil  Ascriptin Extra Strength Dolobid Moblgesic Ticlid  Ascriptin with Codeine Doloprin or Doloprin with Codeine Momentum Tolectin  Asperbuf Duoprin Mono-gesic Trendar  Aspergum Duradyne Motrin or Motrin IB Triminicin  Aspirin plain, buffered or enteric coated Durasal Myochrisine Trigesic  Aspirin Suppositories Easprin Nalfon Trillsate  Aspirin with Codeine Ecotrin Regular or Extra Strength Naprosyn Uracel  Atromid-S Efficin Naproxen Ursinus  Auranofin Capsules Elmiron Neocylate Vanquish  Axotal Emagrin Norgesic Verin  Azathioprine Empirin or Empirin with Codeine Normiflo Vitamin E  Azolid Emprazil Nuprin Voltaren  Bayer  Aspirin plain, buffered or children's or timed BC Tablets or powders Encaprin Orgaran Warfarin Sodium  Buff-a-Comp Enoxaparin Orudis Zorpin  Buff-a-Comp with Codeine Equegesic Os-Cal-Gesic   Buffaprin Excedrin plain, buffered or Extra Strength Oxalid   Bufferin Arthritis   Strength Feldene Oxphenbutazone   Bufferin plain or Extra Strength Feldene Capsules Oxycodone with Aspirin   Bufferin with Codeine Fenoprofen Fenoprofen Pabalate or Pabalate-SF   Buffets II Flogesic Panagesic   Buffinol plain or Extra Strength Florinal or Florinal with Codeine Panwarfarin   Buf-Tabs Flurbiprofen Penicillamine   Butalbital Compound Four-way cold tablets Penicillin   Butazolidin Fragmin Pepto-Bismol   Carbenicillin Geminisyn Percodan   Carna Arthritis Reliever Geopen Persantine   Carprofen Gold's salt Persistin   Chloramphenicol Goody's Phenylbutazone   Chloromycetin Haltrain Piroxlcam   Clmetidine heparin Plaquenil   Cllnoril Hyco-pap Ponstel   Clofibrate Hydroxy chloroquine Propoxyphen         Before stopping any of these medications, be sure to consult the physician who ordered them.  Some, such as Coumadin (Warfarin) are ordered to prevent or treat serious conditions such as "deep thrombosis", "pumonary embolisms", and other heart problems.  The amount of time that you may need off of the medication may also vary with the medication and the reason for which you were taking it.  If you are taking any of these medications, please make sure you notify your pain physician before you undergo any procedures.         Epidural Steroid Injection Patient Information  Description: The epidural space surrounds the nerves as they exit the spinal cord.  In some patients, the nerves can be compressed and inflamed by a bulging disc or a tight spinal canal (spinal stenosis).  By injecting steroids into the epidural space, we can bring irritated nerves into direct contact with a potentially helpful medication.   These steroids act directly on the irritated nerves and can reduce swelling and inflammation which often leads to decreased pain.  Epidural steroids may be injected anywhere along the spine and from the neck to the low back depending upon the location of your pain.   After numbing the skin with local anesthetic (like Novocaine), a small needle is passed into the epidural space slowly.  You may experience a sensation of pressure while this is being done.  The entire block usually last less than 10 minutes.  Conditions which may be treated by epidural steroids:   Low back and leg pain  Neck and arm pain  Spinal stenosis  Post-laminectomy syndrome  Herpes zoster (shingles) pain  Pain from compression fractures  Preparation for the injection:  9. Do not eat any solid food or dairy products within 6 hours of your appointment.  10. You may drink clear liquids up to 2 hours before appointment.  Clear liquids include water, black coffee, juice or soda.  No milk or cream please. 11. You may take your regular medication, including pain medications, with a sip of water before your appointment  Diabetics should hold regular insulin (if taken separately) and take 1/2 normal NPH dos the morning of the procedure.  Carry some sugar containing items with you to your appointment. 12. A driver must accompany you and be prepared to drive you home after your procedure.  5. Bring all your current medications with your. 14. An IV may be inserted and sedation may be given at the discretion of the physician.   15. A blood pressure cuff, EKG and other monitors will often be applied during the procedure.  Some patients may need to have extra oxygen administered for a short period. 64. You will be asked to provide medical information, including your allergies, prior to the procedure.  We must know immediately if you are taking  blood thinners (like Coumadin/Warfarin)  Or if you are allergic to IV iodine contrast  (dye). We must know if you could possible be pregnant.  Possible side-effects:  Bleeding from needle site  Infection (rare, may require surgery)  Nerve injury (rare)  Numbness & tingling (temporary)  Difficulty urinating (rare, temporary)  Spinal headache ( a headache worse with upright posture)  Light -headedness (temporary)  Pain at injection site (several days)  Decreased blood pressure (temporary)  Weakness in arm/leg (temporary)  Pressure sensation in back/neck (temporary)  Call if you experience:  Fever/chills associated with headache or increased back/neck pain.  Headache worsened by an upright position.  New onset weakness or numbness of an extremity below the injection site  Hives or difficulty breathing (go to the emergency room)  Inflammation or drainage at the infection site  Severe back/neck pain  Any new symptoms which are concerning to you  Please note:  Although the local anesthetic injected can often make your back or neck feel good for several hours after the injection, the pain will likely return.  It takes 3-7 days for steroids to work in the epidural space.  You may not notice any pain relief for at least that one week.  If effective, we will often do a series of three injections spaced 3-6 weeks apart to maximally decrease your pain.  After the initial series, we generally will wait several months before considering a repeat injection of the same type.  If you have any questions, please call [phone] [facility] Patient Information  Description: The epidural space surrounds the nerves as they exit the spinal cord.  In some patients, the nerves can be compressed and inflamed by a bulging disc or a tight spinal canal (spinal stenosis).  By injecting steroids into the epidural space, we can bring irritated nerves into direct contact with a potentially helpful medication.  These  steroids act directly on the irritated nerves and can reduce swelling and inflammation which often leads to decreased pain.  Epidural steroids may be injected anywhere along the spine and from the neck to the low back depending upon the location of your pain.   After numbing the skin with local anesthetic (like Novocaine), a small needle is passed into the epidural space slowly.  You may experience a sensation of pressure while this is being done.  The entire block usually last less than 10 minutes.  Conditions which may be treated by epidural steroids:   Low back and leg pain  Neck and arm pain  Spinal stenosis  Post-laminectomy syndrome  Herpes zoster (shingles) pain  Pain from compression fractures  Preparation for the injection:  17. Do not eat any solid food or dairy products within 6 hours of your appointment.  18. You may drink clear liquids up to 2 hours before appointment.  Clear liquids include water, black coffee, juice or soda.  No milk or cream please. 12. You may take your regular medication, including pain medications, with a sip of water before your appointment  Diabetics should hold regular insulin (if taken separately) and take 1/2 normal NPH dos the morning of the procedure.  Carry some sugar containing items with you to your appointment. 20. A driver must accompany you and be prepared to drive you home after your procedure.  21. Bring all your current medications with your. 22. An IV may be inserted and sedation may be given at the discretion of the physician.  23. A blood pressure cuff, EKG and other monitors will often be applied during the procedure.  Some patients may need to have extra oxygen administered for a short period. 24. You will be asked to provide medical information, including your allergies, prior to the procedure.  We must know immediately if you are taking blood thinners (like Coumadin/Warfarin)  Or if you are allergic to IV iodine contrast (dye). We  must know if you could possible be pregnant.  Possible side-effects:  Bleeding from needle site  Infection (rare, may require surgery)  Nerve injury (rare)  Numbness & tingling (temporary)  Difficulty urinating (rare, temporary)  Spinal headache ( a headache worse with upright posture)  Light -headedness (temporary)  Pain at injection site (several days)  Decreased blood pressure (temporary)  Weakness in arm/leg (temporary)  Pressure sensation in back/neck (temporary)  Call if you experience:  Fever/chills associated with headache or increased back/neck pain.  Headache worsened by an upright position.  New onset weakness or numbness of an extremity below the injection site  Hives or difficulty breathing (go to the emergency room)  Inflammation or drainage at the infection site  Severe back/neck pain  Any new symptoms which are concerning to you  Please note:  Although the local anesthetic injected can often make your back or neck feel good for several hours after the injection, the pain will likely return.  It takes 3-7 days for steroids to work in the epidural space.  You may not notice any pain relief for at least that one week.  If effective, we will often do a series of three injections spaced 3-6 weeks apart to maximally decrease your pain.  After the initial series, we generally will wait several months before considering a repeat injection of the same type.  If you have any questions, please call [phone] [facility]

## 2015-11-13 NOTE — Progress Notes (Signed)
Safety precautions to be maintained throughout the outpatient stay will include: orient to surroundings, keep bed in low position, maintain call bell within reach at all times, provide assistance with transfer out of bed and ambulation.  

## 2015-11-14 ENCOUNTER — Telehealth: Payer: Self-pay | Admitting: Anesthesiology

## 2015-11-14 NOTE — Progress Notes (Signed)
Subjective:  Patient ID: Christina Zamora, female    DOB: 05-25-1961  Age: 55 y.o. MRN: [id number]  CC: Back Pain   HPI Christina Zamora presents for nutrition evaluation today. She describes a long-standing history of low back pain beginning back in November 2016. This pain mainly resides in the low back radiation down the posterior lateral legs left side greater than right for no known inciting event. Her maximum VAS score is a 7 and occasionally down to a 0-2. The pain seems to be worse at night. Sitting walking and working seemed to aggravate her pain with some alleviation with medication management. She has not had any problems with bowel or bladder function that she mentions today or associated numbness or tingling to the feet however she does have some calf cramping and pain that wakes her up at night. No known motor weakness is noted the pain description is a gnawing aching type pain with associated deep disabling characteristics in her Legs. She Denies Any Previous MRI Findings or Previous X-Rays at This Time.  History Christina Zamora has a past medical history of Hypertension; Hyperlipidemia; GERD (gastroesophageal reflux disease); Arthritis; CAD (coronary artery disease); Vitamin D deficiency; Thyroid disease; Lumbago; and Insomnia.   She has past surgical history that includes Coronary angioplasty with stent; Bilateral carpal tunnel release (2000); and Replacement total knee (Left, 2015).   Her family history includes Diabetes in her mother; Heart attack in her father; Heart failure in her mother; Hyperlipidemia in her mother.She reports that she has quit smoking. She does not have any smokeless tobacco history on file. She reports that she does not drink alcohol or use illicit drugs.   ---------------------------------------------------------------------------------------------------------------------- Past Medical History  Diagnosis Date  . Hypertension   . Hyperlipidemia   . GERD  (gastroesophageal reflux disease)   . Arthritis   . CAD (coronary artery disease)   . Vitamin D deficiency   . Thyroid disease   . Lumbago   . Insomnia     Past Surgical History  Procedure Laterality Date  . Coronary angioplasty with stent placement    . Bilateral carpal tunnel release  2000  . Replacement total knee Left 2015    Family History  Problem Relation Age of Onset  . Heart failure Mother   . Diabetes Mother   . Hyperlipidemia Mother   . Heart attack Father     Social History  Substance Use Topics  . Smoking status: Former Smoker -- 0.50 packs/day for 20 years  . Smokeless tobacco: Not on file  . Alcohol Use: No    ---------------------------------------------------------------------------------------------------------------------- Social History   Social History  . Marital Status: Married    Spouse Name: N/A  . Number of Children: N/A  . Years of Education: N/A   Social History Main Topics  . Smoking status: Former Smoker -- 0.50 packs/day for 20 years  . Smokeless tobacco: None  . Alcohol Use: No  . Drug Use: No  . Sexual Activity: Not Asked   Other Topics Concern  . None   Social History Narrative      ----------------------------------------------------------------------------------------------------------------------  ROS Review of Systems  Heart reveals a history of high blood pressure previous heart catheterization and she is on Lidex. Ulnar significant for snoring Neurologic per scoliosis Psychologic history depression GI history of reflux GU negative Hematologic with easy bruising Endocrine for thyroid disease and hypothyroid state   Objective:  BP 117/75 mmHg  Pulse 86  Temp(Src) 98.1 F (36.7 C) (Oral)  Resp  14  Ht 5\' 3"  (1.6 m)  Wt 192 lb (87.091 kg)  BMI 34.02 kg/m2  SpO2 100%  Physical Exam  Patient is alert oriented cooperative and a decent historian Heart is regular rate and rhythm without murmur Lungs are  clear to auscultation Inspection low back reveals some paraspinous muscle tenderness. She does have pain on extension with bilateral aggravation with left and right rotation and pain that radiates in the posterior buttocks and posterior legs. With the patient in the supine position she has a negative straight leg raise on the right side and somewhat equivocal on the left with some calf cramping. Sensation appears to be grossly intact throughout the lower extremities and strength is 5 over 5 with proximal and distal. Muscle tone and bulk is good     Assessment & Plan:   Christina Zamora was seen today for back pain.  Diagnoses and all orders for this visit:  DDD (degenerative disc disease), lumbar -     LUMBAR EPIDURAL STEROID INJECTION; Future  Sciatica associated with disorder of lumbar spine, left -     LUMBAR EPIDURAL STEROID INJECTION; Future  Sciatica associated with disorder of lumbar spine, right -     LUMBAR EPIDURAL STEROID INJECTION; Future  Facet arthritis of lumbosacral region  Other orders -     traMADol (ULTRAM) 50 MG tablet; Take 1 tablet (50 mg total) by mouth 4 (four) times daily.     ----------------------------------------------------------------------------------------------------------------------  Problem List Items Addressed This Visit    None    Visit Diagnoses    DDD (degenerative disc disease), lumbar    -  Primary    Relevant Medications    traMADol (ULTRAM) 50 MG tablet    Other Relevant Orders    LUMBAR EPIDURAL STEROID INJECTION    Sciatica associated with disorder of lumbar spine, left        Relevant Orders    LUMBAR EPIDURAL STEROID INJECTION    Sciatica associated with disorder of lumbar spine, right        Relevant Orders    LUMBAR EPIDURAL STEROID INJECTION    Facet arthritis of lumbosacral region        Relevant Medications    traMADol (ULTRAM) 50 MG tablet        ----------------------------------------------------------------------------------------------------------------------  1. DDD (degenerative disc disease), lumbar We have scheduled her for lumbar epidural steroid at her next visit in regard of the risks and benefits of the procedure with her in full detail all questions answered quickly this will help with some of the radicular symptoms he's having into the L5 and S1 distribution left side greater than right - LUMBAR EPIDURAL STEROID INJECTION; Future  2. Sciatica associated with disorder of lumbar spine, left Same - LUMBAR EPIDURAL STEROID INJECTION; Future  3. Sciatica associated with disorder of lumbar spine, right  - LUMBAR EPIDURAL STEROID INJECTION; Future  4. Facet arthritis of lumbosacral region Patient may be a candidate for facet injection pending her response to epidural steroid injection. Meantime on her to continue with her back stretching strengthening exercises as described her in detail today and ultimately she may be a candidate for further physical therapy intervention    ----------------------------------------------------------------------------------------------------------------------  I am having Christina Zamora start on traMADol. I am also having her maintain her clopidogrel, atorvastatin, buPROPion, celecoxib, levothyroxine, ramipril, amLODipine, pantoprazole, traZODone, nitroGLYCERIN, aspirin, multivitamin, Vitamin D (Ergocalciferol), and Cholecalciferol (VITAMIN D PO).   Meds ordered this encounter  Medications  . Multiple Vitamin (MULTIVITAMIN) tablet    Sig:  Take 1 tablet by mouth daily.  . Vitamin D, Ergocalciferol, (DRISDOL) 50000 units CAPS capsule    Sig: Take 50,000 Units by mouth every 7 (seven) days.  . Cholecalciferol (VITAMIN D PO)    Sig: Take 5,000 Units by mouth daily.  . traMADol (ULTRAM) 50 MG tablet    Sig: Take 1 tablet (50 mg total) by mouth 4 (four) times daily.    Dispense:  120  tablet    Refill:  1       Follow-up: Return in about 2 weeks (around 11/27/2015) for procedure.    Molli Barrows, MD

## 2015-11-14 NOTE — Telephone Encounter (Signed)
Patient usually get Ultracet, med called in was different, just wanted to confirm the change

## 2015-11-15 NOTE — Telephone Encounter (Signed)
Patient called and informed that Dr. Andree Elk wants tramadol as prescribed. Also , I called Dr. Trish Mage office to inquire about Plavix cessation permission. Patient will need an appointment with Dr. Humphrey Rolls prior to clearance approval. Patient notified.

## 2015-11-22 LAB — TOXASSURE SELECT 13 (MW), URINE: PDF: 0

## 2015-11-29 ENCOUNTER — Telehealth: Payer: Self-pay | Admitting: *Deleted

## 2015-11-29 NOTE — Telephone Encounter (Signed)
patient called and reminded about plavix cessation for procedure next week. Verbalized understanding of instructions for procedure.

## 2015-12-04 ENCOUNTER — Other Ambulatory Visit: Payer: Self-pay

## 2015-12-05 ENCOUNTER — Ambulatory Visit: Payer: BLUE CROSS/BLUE SHIELD | Attending: Anesthesiology | Admitting: Anesthesiology

## 2015-12-05 ENCOUNTER — Encounter: Payer: Self-pay | Admitting: Anesthesiology

## 2015-12-05 VITALS — BP 137/68 | HR 63 | Temp 98.0°F | Resp 14 | Ht 64.0 in | Wt 191.0 lb

## 2015-12-05 DIAGNOSIS — I2510 Atherosclerotic heart disease of native coronary artery without angina pectoris: Secondary | ICD-10-CM | POA: Diagnosis not present

## 2015-12-05 DIAGNOSIS — E785 Hyperlipidemia, unspecified: Secondary | ICD-10-CM | POA: Diagnosis not present

## 2015-12-05 DIAGNOSIS — M5431 Sciatica, right side: Secondary | ICD-10-CM | POA: Diagnosis not present

## 2015-12-05 DIAGNOSIS — M549 Dorsalgia, unspecified: Secondary | ICD-10-CM | POA: Diagnosis present

## 2015-12-05 DIAGNOSIS — M79606 Pain in leg, unspecified: Secondary | ICD-10-CM | POA: Diagnosis present

## 2015-12-05 DIAGNOSIS — Z87891 Personal history of nicotine dependence: Secondary | ICD-10-CM | POA: Diagnosis not present

## 2015-12-05 DIAGNOSIS — E559 Vitamin D deficiency, unspecified: Secondary | ICD-10-CM | POA: Diagnosis not present

## 2015-12-05 DIAGNOSIS — K219 Gastro-esophageal reflux disease without esophagitis: Secondary | ICD-10-CM | POA: Insufficient documentation

## 2015-12-05 DIAGNOSIS — Z955 Presence of coronary angioplasty implant and graft: Secondary | ICD-10-CM | POA: Insufficient documentation

## 2015-12-05 DIAGNOSIS — M5432 Sciatica, left side: Secondary | ICD-10-CM | POA: Insufficient documentation

## 2015-12-05 DIAGNOSIS — M5136 Other intervertebral disc degeneration, lumbar region: Secondary | ICD-10-CM | POA: Insufficient documentation

## 2015-12-05 DIAGNOSIS — G4700 Insomnia, unspecified: Secondary | ICD-10-CM | POA: Diagnosis not present

## 2015-12-05 DIAGNOSIS — E039 Hypothyroidism, unspecified: Secondary | ICD-10-CM | POA: Insufficient documentation

## 2015-12-05 DIAGNOSIS — M47817 Spondylosis without myelopathy or radiculopathy, lumbosacral region: Secondary | ICD-10-CM | POA: Diagnosis not present

## 2015-12-05 DIAGNOSIS — I10 Essential (primary) hypertension: Secondary | ICD-10-CM | POA: Insufficient documentation

## 2015-12-05 DIAGNOSIS — M4687 Other specified inflammatory spondylopathies, lumbosacral region: Secondary | ICD-10-CM | POA: Diagnosis not present

## 2015-12-05 MED ORDER — TRIAMCINOLONE ACETONIDE 40 MG/ML IJ SUSP
INTRAMUSCULAR | Status: AC
  Administered 2015-12-05: 40 mg
  Filled 2015-12-05: qty 1

## 2015-12-05 MED ORDER — FENTANYL CITRATE (PF) 100 MCG/2ML IJ SOLN
INTRAMUSCULAR | Status: AC
  Filled 2015-12-05: qty 2

## 2015-12-05 MED ORDER — ROPIVACAINE HCL 2 MG/ML IJ SOLN
INTRAMUSCULAR | Status: AC
  Administered 2015-12-05: 1 mL via EPIDURAL
  Filled 2015-12-05: qty 10

## 2015-12-05 MED ORDER — LIDOCAINE HCL (PF) 1 % IJ SOLN
5.0000 mL | Freq: Once | INTRAMUSCULAR | Status: AC
Start: 2015-12-05 — End: 2015-12-05
  Administered 2015-12-05: 3 mL via SUBCUTANEOUS

## 2015-12-05 MED ORDER — IOHEXOL 180 MG/ML  SOLN
20.0000 mL | Freq: Once | INTRAMUSCULAR | Status: DC | PRN
  Administered 2015-12-05: 4 mL
  Filled 2015-12-05: qty 20

## 2015-12-05 MED ORDER — SODIUM CHLORIDE 0.9% FLUSH: 10.0000 mL | Freq: Once | INTRAVENOUS | Status: DC

## 2015-12-05 MED ORDER — TRIAMCINOLONE ACETONIDE 40 MG/ML IJ SUSP
40.0000 mg | Freq: Once | INTRAMUSCULAR | Status: AC
  Administered 2015-12-05: 40 mg

## 2015-12-05 MED ORDER — LIDOCAINE HCL (PF) 1 % IJ SOLN
INTRAMUSCULAR | Status: AC
  Administered 2015-12-05: 3 mL via SUBCUTANEOUS
  Filled 2015-12-05: qty 5

## 2015-12-05 MED ORDER — ROPIVACAINE HCL 2 MG/ML IJ SOLN
10.0000 mL | Freq: Once | INTRAMUSCULAR | Status: AC
  Administered 2015-12-05: 1 mL via EPIDURAL

## 2015-12-05 MED ORDER — SODIUM CHLORIDE 0.9 % IJ SOLN
INTRAMUSCULAR | Status: AC
  Administered 2015-12-05: 5 mL
  Filled 2015-12-05: qty 10

## 2015-12-05 MED ORDER — MIDAZOLAM HCL 2 MG/2ML IJ SOLN
5.0000 mg | Freq: Once | INTRAMUSCULAR | Status: AC
Start: 2015-12-05 — End: 2015-12-05
  Administered 2015-12-05: 2 mg via INTRAVENOUS

## 2015-12-05 MED ORDER — ROPIVACAINE HCL 2 MG/ML IJ SOLN
INTRAMUSCULAR | Status: AC
  Filled 2015-12-05: qty 10

## 2015-12-05 MED ORDER — MIDAZOLAM HCL 5 MG/5ML IJ SOLN
INTRAMUSCULAR | Status: AC
  Administered 2015-12-05: 2 mg via INTRAVENOUS
  Filled 2015-12-05: qty 5

## 2015-12-05 MED ORDER — TRAMADOL HCL 50 MG PO TABS: 100.0000 mg | ORAL_TABLET | Freq: Three times a day (TID) | ORAL | Status: DC

## 2015-12-05 MED ORDER — IOHEXOL 180 MG/ML  SOLN
INTRAMUSCULAR | Status: AC
  Administered 2015-12-05: 4 mL
  Filled 2015-12-05: qty 20

## 2015-12-05 NOTE — Patient Instructions (Signed)
Pain Management Discharge Instructions  General Discharge Instructions :  If you need to reach your doctor call: Monday-Friday 8:00 am - 4:00 pm at [phone] or toll free [phone].  After clinic hours [phone] to have operator reach doctor.  Bring all of your medication bottles to all your appointments in the pain clinic.  To cancel or reschedule your appointment with Pain Management please remember to call 24 hours in advance to avoid a fee.  Refer to the educational materials which you have been given on: General Risks, I had my Procedure. Discharge Instructions, Post Sedation.  Post Procedure Instructions:  The drugs you were given will stay in your system until tomorrow, so for the next 24 hours you should not drive, make any legal decisions or drink any alcoholic beverages.  You may eat anything you prefer, but it is better to start with liquids then soups and crackers, and gradually work up to solid foods.  Please notify your doctor immediately if you have any unusual bleeding, trouble breathing or pain that is not related to your normal pain.  Depending on the type of procedure that was done, some parts of your body may feel week and/or numb.  This usually clears up by tonight or the next day.  Walk with the use of an assistive device or accompanied by an adult for the 24 hours.  You may use ice on the affected area for the first 24 hours.  Put ice in a Ziploc bag and cover with a towel and place against area 15 minutes on 15 minutes off.  You may switch to heat after 24 hours.GENERAL RISKS AND COMPLICATIONS  What are the risk, side effects and possible complications? Generally speaking, most procedures are safe.  However, with any procedure there are risks, side effects, and the possibility of complications.  The risks and complications are dependent upon the sites that are lesioned, or the type of nerve block to be performed.  The closer the procedure is to the spine,  the more serious the risks are.  Great care is taken when placing the radio frequency needles, block needles or lesioning probes, but sometimes complications can occur. 1. Infection: Any time there is an injection through the skin, there is a risk of infection.  This is why sterile conditions are used for these blocks.  There are four possible types of infection. 1. Localized skin infection. 2. Central Nervous System Infection-This can be in the form of Meningitis, which can be deadly. 3. Epidural Infections-This can be in the form of an epidural abscess, which can cause pressure inside of the spine, causing compression of the spinal cord with subsequent paralysis. This would require an emergency surgery to decompress, and there are no guarantees that the patient would recover from the paralysis. 4. Discitis-This is an infection of the intervertebral discs.  It occurs in about 1% of discography procedures.  It is difficult to treat and it may lead to surgery.        2. Pain: the needles have to go through skin and soft tissues, will cause soreness.       3. Damage to internal structures:  The nerves to be lesioned may be near blood vessels or    other nerves which can be potentially damaged.       4. Bleeding: Bleeding is more common if the patient is taking blood thinners such as  aspirin, Coumadin, Ticiid, Plavix, etc., or if he/she have some genetic predisposition  such as   hemophilia. Bleeding into the spinal canal can cause compression of the spinal  cord with subsequent paralysis.  This would require an emergency surgery to  decompress and there are no guarantees that the patient would recover from the  paralysis.       5. Pneumothorax:  Puncturing of a lung is a possibility, every time a needle is introduced in  the area of the chest or upper back.  Pneumothorax refers to free air around the  collapsed lung(s), inside of the thoracic cavity (chest cavity).  Another two possible  complications  related to a similar event would include: Hemothorax and Chylothorax.   These are variations of the Pneumothorax, where instead of air around the collapsed  lung(s), you may have blood or chyle, respectively.       6. Spinal headaches: They may occur with any procedures in the area of the spine.       7. Persistent CSF (Cerebro-Spinal Fluid) leakage: This is a rare problem, but may occur  with prolonged intrathecal or epidural catheters either due to the formation of a fistulous  track or a dural tear.       8. Nerve damage: By working so close to the spinal cord, there is always a possibility of  nerve damage, which could be as serious as a permanent spinal cord injury with  paralysis.       9. Death:  Although rare, severe deadly allergic reactions known as "Anaphylactic  reaction" can occur to any of the medications used.      10. Worsening of the symptoms:  We can always make thing worse.  What are the chances of something like this happening? Chances of any of this occuring are extremely low.  By statistics, you have more of a chance of getting killed in a motor vehicle accident: while driving to the hospital than any of the above occurring .  Nevertheless, you should be aware that they are possibilities.  In general, it is similar to taking a shower.  Everybody knows that you can slip, hit your head and get killed.  Does that mean that you should not shower again?  Nevertheless always keep in mind that statistics do not mean anything if you happen to be on the wrong side of them.  Even if a procedure has a 1 (one) in a 1,000,000 (million) chance of going wrong, it you happen to be that one..Also, keep in mind that by statistics, you have more of a chance of having something go wrong when taking medications.  Who should not have this procedure? If you are on a blood thinning medication (e.g. Coumadin, Plavix, see list of "Blood Thinners"), or if you have an active infection going on, you should not  have the procedure.  If you are taking any blood thinners, please inform your physician.  How should I prepare for this procedure?  Do not eat or drink anything at least six hours prior to the procedure.  Bring a driver with you .  It cannot be a taxi.  Come accompanied by an adult that can drive you back, and that is strong enough to help you if your legs get weak or numb from the local anesthetic.  Take all of your medicines the morning of the procedure with just enough water to swallow them.  If you have diabetes, make sure that you are scheduled to have your procedure done first thing in the morning, whenever possible.  If you have diabetes,   take only half of your insulin dose and notify our nurse that you have done so as soon as you arrive at the clinic.  If you are diabetic, but only take blood sugar pills (oral hypoglycemic), then do not take them on the morning of your procedure.  You may take them after you have had the procedure.  Do not take aspirin or any aspirin-containing medications, at least eleven (11) days prior to the procedure.  They may prolong bleeding.  Wear loose fitting clothing that may be easy to take off and that you would not mind if it got stained with Betadine or blood.  Do not wear any jewelry or perfume  Remove any nail coloring.  It will interfere with some of our monitoring equipment.  NOTE: Remember that this is not meant to be interpreted as a complete list of all possible complications.  Unforeseen problems may occur.  BLOOD THINNERS The following drugs contain aspirin or other products, which can cause increased bleeding during surgery and should not be taken for 2 weeks prior to and 1 week after surgery.  If you should need take something for relief of minor pain, you may take acetaminophen which is found in Tylenol,m Datril, Anacin-3 and Panadol. It is not blood thinner. The products listed below are.  Do not take any of the products listed below  in addition to any listed on your instruction sheet.  A.P.C or A.P.C with Codeine Codeine Phosphate Capsules #3 Ibuprofen Ridaura  ABC compound Congesprin Imuran rimadil  Advil Cope Indocin Robaxisal  Alka-Seltzer Effervescent Pain Reliever and Antacid Coricidin or Coricidin-D  Indomethacin Rufen  Alka-Seltzer plus Cold Medicine Cosprin Ketoprofen S-A-C Tablets  Anacin Analgesic Tablets or Capsules Coumadin Korlgesic Salflex  Anacin Extra Strength Analgesic tablets or capsules CP-2 Tablets Lanoril Salicylate  Anaprox Cuprimine Capsules Levenox Salocol  Anexsia-D Dalteparin Magan Salsalate  Anodynos Darvon compound Magnesium Salicylate Sine-off  Ansaid Dasin Capsules Magsal Sodium Salicylate  Anturane Depen Capsules Marnal Soma  APF Arthritis pain formula Dewitt's Pills Measurin Stanback  Argesic Dia-Gesic Meclofenamic Sulfinpyrazone  Arthritis Bayer Timed Release Aspirin Diclofenac Meclomen Sulindac  Arthritis pain formula Anacin Dicumarol Medipren Supac  Analgesic (Safety coated) Arthralgen Diffunasal Mefanamic Suprofen  Arthritis Strength Bufferin Dihydrocodeine Mepro Compound Suprol  Arthropan liquid Dopirydamole Methcarbomol with Aspirin Synalgos  ASA tablets/Enseals Disalcid Micrainin Tagament  Ascriptin Doan's Midol Talwin  Ascriptin A/D Dolene Mobidin Tanderil  Ascriptin Extra Strength Dolobid Moblgesic Ticlid  Ascriptin with Codeine Doloprin or Doloprin with Codeine Momentum Tolectin  Asperbuf Duoprin Mono-gesic Trendar  Aspergum Duradyne Motrin or Motrin IB Triminicin  Aspirin plain, buffered or enteric coated Durasal Myochrisine Trigesic  Aspirin Suppositories Easprin Nalfon Trillsate  Aspirin with Codeine Ecotrin Regular or Extra Strength Naprosyn Uracel  Atromid-S Efficin Naproxen Ursinus  Auranofin Capsules Elmiron Neocylate Vanquish  Axotal Emagrin Norgesic Verin  Azathioprine Empirin or Empirin with Codeine Normiflo Vitamin E  Azolid Emprazil Nuprin Voltaren  Bayer  Aspirin plain, buffered or children's or timed BC Tablets or powders Encaprin Orgaran Warfarin Sodium  Buff-a-Comp Enoxaparin Orudis Zorpin  Buff-a-Comp with Codeine Equegesic Os-Cal-Gesic   Buffaprin Excedrin plain, buffered or Extra Strength Oxalid   Bufferin Arthritis Strength Feldene Oxphenbutazone   Bufferin plain or Extra Strength Feldene Capsules Oxycodone with Aspirin   Bufferin with Codeine Fenoprofen Fenoprofen Pabalate or Pabalate-SF   Buffets II Flogesic Panagesic   Buffinol plain or Extra Strength Florinal or Florinal with Codeine Panwarfarin   Buf-Tabs Flurbiprofen Penicillamine   Butalbital Compound Four-way cold tablets   Penicillin   Butazolidin Fragmin Pepto-Bismol   Carbenicillin Geminisyn Percodan   Carna Arthritis Reliever Geopen Persantine   Carprofen Gold's salt Persistin   Chloramphenicol Goody's Phenylbutazone   Chloromycetin Haltrain Piroxlcam   Clmetidine heparin Plaquenil   Cllnoril Hyco-pap Ponstel   Clofibrate Hydroxy chloroquine Propoxyphen         Before stopping any of these medications, be sure to consult the physician who ordered them.  Some, such as Coumadin (Warfarin) are ordered to prevent or treat serious conditions such as "deep thrombosis", "pumonary embolisms", and other heart problems.  The amount of time that you may need off of the medication may also vary with the medication and the reason for which you were taking it.  If you are taking any of these medications, please make sure you notify your pain physician before you undergo any procedures.         Epidural Steroid Injection An epidural steroid injection is given to relieve pain in your neck, back, or legs that is caused by the irritation or swelling of a nerve root. This procedure involves injecting a steroid and numbing medicine (anesthetic) into the epidural space. The epidural space is the space between the outer covering of your spinal cord and the bones that form your backbone  (vertebra).  LET YOUR HEALTH CARE PROVIDER KNOW ABOUT:  2. Any allergies you have. 3. All medicines you are taking, including vitamins, herbs, eye drops, creams, and over-the-counter medicines such as aspirin. 4. Previous problems you or members of your family have had with the use of anesthetics. 5. Any blood disorders or blood clotting disorders you have. 6. Previous surgeries you have had. 7. Medical conditions you have. RISKS AND COMPLICATIONS Generally, this is a safe procedure. However, as with any procedure, complications can occur. Possible complications of epidural steroid injection include:  Headache.  Bleeding.  Infection.  Allergic reaction to the medicines.  Damage to your nerves. The response to this procedure depends on the underlying cause of the pain and its duration. People who have long-term (chronic) pain are less likely to benefit from epidural steroids than are those people whose pain comes on strong and suddenly. BEFORE THE PROCEDURE   Ask your health care provider about changing or stopping your regular medicines. You may be advised to stop taking blood-thinning medicines a few days before the procedure.  You may be given medicines to reduce anxiety.  Arrange for someone to take you home after the procedure. PROCEDURE   You will remain awake during the procedure. You may receive medicine to make you relaxed.  You will be asked to lie on your stomach.  The injection site will be cleaned.  The injection site will be numbed with a medicine (local anesthetic).  A needle will be injected through your skin into the epidural space.  Your health care provider will use an X-ray machine to ensure that the steroid is delivered closest to the affected nerve. You may have minimal discomfort at this time.  Once the needle is in the right position, the local anesthetic and the steroid will be injected into the epidural space.  The needle will then be removed and a  bandage will be applied to the injection site. AFTER THE PROCEDURE  12. You may be monitored for a short time before you go home. 13. You may feel weakness or numbness in your arm or leg, which disappears within hours. 14. You may be allowed to eat, drink, and take your regular   medicine. 15. You may have soreness at the site of the injection.   This information is not intended to replace advice given to you by your health care provider. Make sure you discuss any questions you have with your health care provider.   Document Released: 12/16/2007 Document Revised: 05/11/2013 Document Reviewed: 02/25/2013 [employer] Interactive Patient Education 2016 [employer]  

## 2015-12-05 NOTE — Progress Notes (Signed)
Subjective:  Patient ID: Christina Zamora, female    DOB: 1961-05-02  Age: 55 y.o. MRN: [id number]  CC: Back Pain and Leg Pain  Procedure: L5-S1 lumbar epidural steroid 12/05/2014  HPI Christina Zamora presents today for reevaluation and her first lumbar epidural steroid injection. The quality and characteristic of distribution of her pain otherwise stable in nature without change today. Having left side greater than right side low back pain with radiation into the lower extremities with left calf cramping greater than right but no other changes are noted   History Christina Zamora has a past medical history of Hypertension; Hyperlipidemia; GERD (gastroesophageal reflux disease); Arthritis; CAD (coronary artery disease); Vitamin D deficiency; Thyroid disease; Lumbago; and Insomnia.   She has past surgical history that includes Coronary angioplasty with stent; Bilateral carpal tunnel release (2000); and Replacement total knee (Left, 2015).   Her family history includes Diabetes in her mother; Heart attack in her father; Heart failure in her mother; Hyperlipidemia in her mother.She reports that she has quit smoking. She does not have any smokeless tobacco history on file. She reports that she does not drink alcohol or use illicit drugs.   ---------------------------------------------------------------------------------------------------------------------- Past Medical History  Diagnosis Date  . Hypertension   . Hyperlipidemia   . GERD (gastroesophageal reflux disease)   . Arthritis   . CAD (coronary artery disease)   . Vitamin D deficiency   . Thyroid disease   . Lumbago   . Insomnia     Past Surgical History  Procedure Laterality Date  . Coronary angioplasty with stent placement    . Bilateral carpal tunnel release  2000  . Replacement total knee Left 2015    Family History  Problem Relation Age of Onset  . Heart failure Mother   . Diabetes Mother   . Hyperlipidemia Mother   .  Heart attack Father     Social History  Substance Use Topics  . Smoking status: Former Smoker -- 0.50 packs/day for 20 years  . Smokeless tobacco: Not on file  . Alcohol Use: No    ---------------------------------------------------------------------------------------------------------------------- Social History   Social History  . Marital Status: Married    Spouse Name: N/A  . Number of Children: N/A  . Years of Education: N/A   Social History Main Topics  . Smoking status: Former Smoker -- 0.50 packs/day for 20 years  . Smokeless tobacco: None  . Alcohol Use: No  . Drug Use: No  . Sexual Activity: Not Asked   Other Topics Concern  . None   Social History Narrative      ----------------------------------------------------------------------------------------------------------------------  ROS Review of Systems  Heart reveals a history of high blood pressure previous heart catheterization and she is on Lidex. Ulnar significant for snoring Neurologic per scoliosis Psychologic history depression GI history of reflux GU negative Hematologic with easy bruising Endocrine for thyroid disease and hypothyroid state   Objective:  BP 136/83 mmHg  Pulse 62  Temp(Src) 97.9 F (36.6 C) (Oral)  Resp 16  Ht 5\' 4"  (1.626 m)  Wt 191 lb (86.637 kg)  BMI 32.77 kg/m2  SpO2 99%  Physical Exam  Patient is alert oriented cooperative and a decent historian Heart is regular rate and rhythm without murmur Lungs are clear to auscultation Inspection low back reveals some paraspinous muscle tenderness.Otherwise no changes are noted today.   Assessment & Plan:   Christina Zamora was seen today for back pain and leg pain.  Diagnoses and all orders for this visit:  DDD (  degenerative disc disease), lumbar  Sciatica associated with disorder of lumbar spine, left  Sciatica associated with disorder of lumbar spine, right  Facet arthritis of lumbosacral  region     ----------------------------------------------------------------------------------------------------------------------  Problem List Items Addressed This Visit    None    Visit Diagnoses    DDD (degenerative disc disease), lumbar    -  Primary    Sciatica associated with disorder of lumbar spine, left        Sciatica associated with disorder of lumbar spine, right        Facet arthritis of lumbosacral region           ----------------------------------------------------------------------------------------------------------------------  1. DDD (degenerative disc disease), lumbar We will perform her first epidural injection today. The risks and benefits once again reviewed all questions answered and no guarantees were made. We will have her return to clinic in 1 month for reevaluation probable repeat epidural at that time depending on how she responds to this we may consider ordering MRI for further evaluation.- LUMBAR EPIDURAL STEROID INJECTION; Future  2. Sciatica associated with disorder of lumbar spine, left Same - LUMBAR EPIDURAL STEROID INJECTION; Future  3. Sciatica associated with disorder of lumbar spine, right  - LUMBAR EPIDURAL STEROID INJECTION; Future  4. Facet arthritis of lumbosacral region Patient may be a candidate for facet injection pending her response to epidural steroid injection. Meantime on her to continue with her back stretching strengthening exercises as described her in detail today and ultimately she may be a candidate for further physical therapy intervention    ----------------------------------------------------------------------------------------------------------------------  I am having Christina Zamora maintain her clopidogrel, atorvastatin, buPROPion, celecoxib, levothyroxine, ramipril, amLODipine, pantoprazole, traZODone, nitroGLYCERIN, aspirin, multivitamin, Vitamin D (Ergocalciferol), Cholecalciferol (VITAMIN D PO), and  traMADol.   No orders of the defined types were placed in this encounter.       Follow-up: No Follow-up on file.    Procedure: L5-S1 LESI with fluoroscopic guidance and moderate sedation  NOTE: The risks, benefits, and expectations of the procedure have been discussed and explained to the patient who was understanding and in agreement with suggested treatment plan. No guarantees were made.  DESCRIPTION OF PROCEDURE: Lumbar epidural steroid injection with IV Versed, EKG, blood pressure, pulse, and pulse oximetry monitoring. The procedure was performed with the patient in the prone position under fluoroscopic guidance. A local anesthetic skin wheal of 1.5% plain lidocaine was performed at the appropriate site after fluoroscopic identifictation  Using strict aseptic technique, I then advanced an 18-gauge Tuohy epidural needle in the midline via loss-of-resistance to saline. There was negative aspiration for heme or  CSF.  I then confirmed position with both AP and Lateral fluoroscan. At L5-S1  A total of 5 mL of Preservative-Free normal saline with 40 mg of Kenalog and 1cc Ropicaine 0.2 percent was injected incrementally via the  epidurally placed needle. Needle removed. The patient tolerated the injection well and was convalesced and discharged to home in stable condition. If the patient has any post procedure difficulty they have been instructed on how to contact us for assistance.   [email or web address], [email or web address]   Molli Barrows, MD

## 2015-12-05 NOTE — Progress Notes (Signed)
Patient here for procedure for back pain.  Has some questions re; medication management.

## 2015-12-06 ENCOUNTER — Telehealth: Payer: Self-pay | Admitting: *Deleted

## 2015-12-06 NOTE — Telephone Encounter (Signed)
Spoke with patient denies any complications from procedure.  States that she did wake up with a slight headache but attributes this to cold s/s.  Told patient that if the headache persists or becomes more severe to let call our office.

## 2016-01-10 ENCOUNTER — Ambulatory Visit: Payer: Self-pay | Admitting: Anesthesiology

## 2016-02-07 ENCOUNTER — Encounter: Payer: Self-pay | Admitting: Anesthesiology

## 2016-02-07 ENCOUNTER — Ambulatory Visit: Payer: BLUE CROSS/BLUE SHIELD | Attending: Anesthesiology | Admitting: Anesthesiology

## 2016-02-07 VITALS — BP 145/92 | HR 86 | Temp 98.2°F | Resp 16 | Ht 64.0 in | Wt 195.0 lb

## 2016-02-07 DIAGNOSIS — E785 Hyperlipidemia, unspecified: Secondary | ICD-10-CM | POA: Insufficient documentation

## 2016-02-07 DIAGNOSIS — I2510 Atherosclerotic heart disease of native coronary artery without angina pectoris: Secondary | ICD-10-CM | POA: Diagnosis not present

## 2016-02-07 DIAGNOSIS — M5442 Lumbago with sciatica, left side: Secondary | ICD-10-CM | POA: Insufficient documentation

## 2016-02-07 DIAGNOSIS — K219 Gastro-esophageal reflux disease without esophagitis: Secondary | ICD-10-CM | POA: Diagnosis not present

## 2016-02-07 DIAGNOSIS — M5431 Sciatica, right side: Secondary | ICD-10-CM

## 2016-02-07 DIAGNOSIS — G4700 Insomnia, unspecified: Secondary | ICD-10-CM | POA: Diagnosis not present

## 2016-02-07 DIAGNOSIS — Z96652 Presence of left artificial knee joint: Secondary | ICD-10-CM | POA: Insufficient documentation

## 2016-02-07 DIAGNOSIS — M549 Dorsalgia, unspecified: Secondary | ICD-10-CM | POA: Diagnosis present

## 2016-02-07 DIAGNOSIS — M5136 Other intervertebral disc degeneration, lumbar region: Secondary | ICD-10-CM | POA: Insufficient documentation

## 2016-02-07 DIAGNOSIS — M5441 Lumbago with sciatica, right side: Secondary | ICD-10-CM | POA: Insufficient documentation

## 2016-02-07 DIAGNOSIS — E559 Vitamin D deficiency, unspecified: Secondary | ICD-10-CM | POA: Diagnosis not present

## 2016-02-07 DIAGNOSIS — I10 Essential (primary) hypertension: Secondary | ICD-10-CM | POA: Diagnosis not present

## 2016-02-07 DIAGNOSIS — Z87891 Personal history of nicotine dependence: Secondary | ICD-10-CM | POA: Diagnosis not present

## 2016-02-07 DIAGNOSIS — M5432 Sciatica, left side: Secondary | ICD-10-CM

## 2016-02-07 MED ORDER — IOPAMIDOL (ISOVUE-M 200) INJECTION 41%: 20.0000 mL | Freq: Once | INTRAMUSCULAR | Status: DC | PRN

## 2016-02-07 MED ORDER — TRIAMCINOLONE ACETONIDE 40 MG/ML IJ SUSP
40.0000 mg | Freq: Once | INTRAMUSCULAR | Status: AC
  Administered 2016-02-07: 40 mg

## 2016-02-07 MED ORDER — LIDOCAINE HCL (PF) 1 % IJ SOLN
5.0000 mL | Freq: Once | INTRAMUSCULAR | Status: AC
  Administered 2016-02-07: 5 mL via SUBCUTANEOUS

## 2016-02-07 MED ORDER — SODIUM CHLORIDE 0.9% FLUSH
10.0000 mL | Freq: Once | INTRAVENOUS | Status: AC
  Administered 2016-02-07: 10 mL

## 2016-02-07 MED ORDER — ROPIVACAINE HCL 2 MG/ML IJ SOLN
10.0000 mL | Freq: Once | INTRAMUSCULAR | Status: AC
  Administered 2016-02-07: 10 mL via EPIDURAL

## 2016-02-07 NOTE — Progress Notes (Signed)
   Subjective:    Patient ID: Christina Zamora, female    DOB: [birth year], 55 y.o.   MRN: [id number]  HPI    Review of Systems     Objective:   Physical Exam        Assessment & Plan:

## 2016-02-07 NOTE — Progress Notes (Signed)
Safety precautions to be maintained throughout the outpatient stay will include: orient to surroundings, keep bed in low position, maintain call bell within reach at all times, provide assistance with transfer out of bed and ambulation.  

## 2016-02-07 NOTE — Patient Instructions (Signed)
Pain Management Discharge Instructions  General Discharge Instructions :  If you need to reach your doctor call: Monday-Friday 8:00 am - 4:00 pm at [phone] or toll free [phone].  After clinic hours [phone] to have operator reach doctor.  Bring all of your medication bottles to all your appointments in the pain clinic.  To cancel or reschedule your appointment with Pain Management please remember to call 24 hours in advance to avoid a fee.  Refer to the educational materials which you have been given on: General Risks, I had my Procedure. Discharge Instructions, Post Sedation.  Post Procedure Instructions:  The drugs you were given will stay in your system until tomorrow, so for the next 24 hours you should not drive, make any legal decisions or drink any alcoholic beverages.  You may eat anything you prefer, but it is better to start with liquids then soups and crackers, and gradually work up to solid foods.  Please notify your doctor immediately if you have any unusual bleeding, trouble breathing or pain that is not related to your normal pain.  Depending on the type of procedure that was done, some parts of your body may feel week and/or numb.  This usually clears up by tonight or the next day.  Walk with the use of an assistive device or accompanied by an adult for the 24 hours.  You may use ice on the affected area for the first 24 hours.  Put ice in a Ziploc bag and cover with a towel and place against area 15 minutes on 15 minutes off.  You may switch to heat after 24 hours.GENERAL RISKS AND COMPLICATIONS  What are the risk, side effects and possible complications? Generally speaking, most procedures are safe.  However, with any procedure there are risks, side effects, and the possibility of complications.  The risks and complications are dependent upon the sites that are lesioned, or the type of nerve block to be performed.  The closer the procedure is to the spine,  the more serious the risks are.  Great care is taken when placing the radio frequency needles, block needles or lesioning probes, but sometimes complications can occur. 1. Infection: Any time there is an injection through the skin, there is a risk of infection.  This is why sterile conditions are used for these blocks.  There are four possible types of infection. 1. Localized skin infection. 2. Central Nervous System Infection-This can be in the form of Meningitis, which can be deadly. 3. Epidural Infections-This can be in the form of an epidural abscess, which can cause pressure inside of the spine, causing compression of the spinal cord with subsequent paralysis. This would require an emergency surgery to decompress, and there are no guarantees that the patient would recover from the paralysis. 4. Discitis-This is an infection of the intervertebral discs.  It occurs in about 1% of discography procedures.  It is difficult to treat and it may lead to surgery.        2. Pain: the needles have to go through skin and soft tissues, will cause soreness.       3. Damage to internal structures:  The nerves to be lesioned may be near blood vessels or    other nerves which can be potentially damaged.       4. Bleeding: Bleeding is more common if the patient is taking blood thinners such as  aspirin, Coumadin, Ticiid, Plavix, etc., or if he/she have some genetic predisposition  such as   hemophilia. Bleeding into the spinal canal can cause compression of the spinal  cord with subsequent paralysis.  This would require an emergency surgery to  decompress and there are no guarantees that the patient would recover from the  paralysis.       5. Pneumothorax:  Puncturing of a lung is a possibility, every time a needle is introduced in  the area of the chest or upper back.  Pneumothorax refers to free air around the  collapsed lung(s), inside of the thoracic cavity (chest cavity).  Another two possible  complications  related to a similar event would include: Hemothorax and Chylothorax.   These are variations of the Pneumothorax, where instead of air around the collapsed  lung(s), you may have blood or chyle, respectively.       6. Spinal headaches: They may occur with any procedures in the area of the spine.       7. Persistent CSF (Cerebro-Spinal Fluid) leakage: This is a rare problem, but may occur  with prolonged intrathecal or epidural catheters either due to the formation of a fistulous  track or a dural tear.       8. Nerve damage: By working so close to the spinal cord, there is always a possibility of  nerve damage, which could be as serious as a permanent spinal cord injury with  paralysis.       9. Death:  Although rare, severe deadly allergic reactions known as "Anaphylactic  reaction" can occur to any of the medications used.      10. Worsening of the symptoms:  We can always make thing worse.  What are the chances of something like this happening? Chances of any of this occuring are extremely low.  By statistics, you have more of a chance of getting killed in a motor vehicle accident: while driving to the hospital than any of the above occurring .  Nevertheless, you should be aware that they are possibilities.  In general, it is similar to taking a shower.  Everybody knows that you can slip, hit your head and get killed.  Does that mean that you should not shower again?  Nevertheless always keep in mind that statistics do not mean anything if you happen to be on the wrong side of them.  Even if a procedure has a 1 (one) in a 1,000,000 (million) chance of going wrong, it you happen to be that one..Also, keep in mind that by statistics, you have more of a chance of having something go wrong when taking medications.  Who should not have this procedure? If you are on a blood thinning medication (e.g. Coumadin, Plavix, see list of "Blood Thinners"), or if you have an active infection going on, you should not  have the procedure.  If you are taking any blood thinners, please inform your physician.  How should I prepare for this procedure?  Do not eat or drink anything at least six hours prior to the procedure.  Bring a driver with you .  It cannot be a taxi.  Come accompanied by an adult that can drive you back, and that is strong enough to help you if your legs get weak or numb from the local anesthetic.  Take all of your medicines the morning of the procedure with just enough water to swallow them.  If you have diabetes, make sure that you are scheduled to have your procedure done first thing in the morning, whenever possible.  If you have diabetes,   take only half of your insulin dose and notify our nurse that you have done so as soon as you arrive at the clinic.  If you are diabetic, but only take blood sugar pills (oral hypoglycemic), then do not take them on the morning of your procedure.  You may take them after you have had the procedure.  Do not take aspirin or any aspirin-containing medications, at least eleven (11) days prior to the procedure.  They may prolong bleeding.  Wear loose fitting clothing that may be easy to take off and that you would not mind if it got stained with Betadine or blood.  Do not wear any jewelry or perfume  Remove any nail coloring.  It will interfere with some of our monitoring equipment.  NOTE: Remember that this is not meant to be interpreted as a complete list of all possible complications.  Unforeseen problems may occur.  BLOOD THINNERS The following drugs contain aspirin or other products, which can cause increased bleeding during surgery and should not be taken for 2 weeks prior to and 1 week after surgery.  If you should need take something for relief of minor pain, you may take acetaminophen which is found in Tylenol,m Datril, Anacin-3 and Panadol. It is not blood thinner. The products listed below are.  Do not take any of the products listed below  in addition to any listed on your instruction sheet.  A.P.C or A.P.C with Codeine Codeine Phosphate Capsules #3 Ibuprofen Ridaura  ABC compound Congesprin Imuran rimadil  Advil Cope Indocin Robaxisal  Alka-Seltzer Effervescent Pain Reliever and Antacid Coricidin or Coricidin-D  Indomethacin Rufen  Alka-Seltzer plus Cold Medicine Cosprin Ketoprofen S-A-C Tablets  Anacin Analgesic Tablets or Capsules Coumadin Korlgesic Salflex  Anacin Extra Strength Analgesic tablets or capsules CP-2 Tablets Lanoril Salicylate  Anaprox Cuprimine Capsules Levenox Salocol  Anexsia-D Dalteparin Magan Salsalate  Anodynos Darvon compound Magnesium Salicylate Sine-off  Ansaid Dasin Capsules Magsal Sodium Salicylate  Anturane Depen Capsules Marnal Soma  APF Arthritis pain formula Dewitt's Pills Measurin Stanback  Argesic Dia-Gesic Meclofenamic Sulfinpyrazone  Arthritis Bayer Timed Release Aspirin Diclofenac Meclomen Sulindac  Arthritis pain formula Anacin Dicumarol Medipren Supac  Analgesic (Safety coated) Arthralgen Diffunasal Mefanamic Suprofen  Arthritis Strength Bufferin Dihydrocodeine Mepro Compound Suprol  Arthropan liquid Dopirydamole Methcarbomol with Aspirin Synalgos  ASA tablets/Enseals Disalcid Micrainin Tagament  Ascriptin Doan's Midol Talwin  Ascriptin A/D Dolene Mobidin Tanderil  Ascriptin Extra Strength Dolobid Moblgesic Ticlid  Ascriptin with Codeine Doloprin or Doloprin with Codeine Momentum Tolectin  Asperbuf Duoprin Mono-gesic Trendar  Aspergum Duradyne Motrin or Motrin IB Triminicin  Aspirin plain, buffered or enteric coated Durasal Myochrisine Trigesic  Aspirin Suppositories Easprin Nalfon Trillsate  Aspirin with Codeine Ecotrin Regular or Extra Strength Naprosyn Uracel  Atromid-S Efficin Naproxen Ursinus  Auranofin Capsules Elmiron Neocylate Vanquish  Axotal Emagrin Norgesic Verin  Azathioprine Empirin or Empirin with Codeine Normiflo Vitamin E  Azolid Emprazil Nuprin Voltaren  Bayer  Aspirin plain, buffered or children's or timed BC Tablets or powders Encaprin Orgaran Warfarin Sodium  Buff-a-Comp Enoxaparin Orudis Zorpin  Buff-a-Comp with Codeine Equegesic Os-Cal-Gesic   Buffaprin Excedrin plain, buffered or Extra Strength Oxalid   Bufferin Arthritis Strength Feldene Oxphenbutazone   Bufferin plain or Extra Strength Feldene Capsules Oxycodone with Aspirin   Bufferin with Codeine Fenoprofen Fenoprofen Pabalate or Pabalate-SF   Buffets II Flogesic Panagesic   Buffinol plain or Extra Strength Florinal or Florinal with Codeine Panwarfarin   Buf-Tabs Flurbiprofen Penicillamine   Butalbital Compound Four-way cold tablets   Penicillin   Butazolidin Fragmin Pepto-Bismol   Carbenicillin Geminisyn Percodan   Carna Arthritis Reliever Geopen Persantine   Carprofen Gold's salt Persistin   Chloramphenicol Goody's Phenylbutazone   Chloromycetin Haltrain Piroxlcam   Clmetidine heparin Plaquenil   Cllnoril Hyco-pap Ponstel   Clofibrate Hydroxy chloroquine Propoxyphen         Before stopping any of these medications, be sure to consult the physician who ordered them.  Some, such as Coumadin (Warfarin) are ordered to prevent or treat serious conditions such as "deep thrombosis", "pumonary embolisms", and other heart problems.  The amount of time that you may need off of the medication may also vary with the medication and the reason for which you were taking it.  If you are taking any of these medications, please make sure you notify your pain physician before you undergo any procedures.         Epidural Steroid Injection Patient Information  Description: The epidural space surrounds the nerves as they exit the spinal cord.  In some patients, the nerves can be compressed and inflamed by a bulging disc or a tight spinal canal (spinal stenosis).  By injecting steroids into the epidural space, we can bring irritated nerves into direct contact with a potentially helpful medication.   These steroids act directly on the irritated nerves and can reduce swelling and inflammation which often leads to decreased pain.  Epidural steroids may be injected anywhere along the spine and from the neck to the low back depending upon the location of your pain.   After numbing the skin with local anesthetic (like Novocaine), a small needle is passed into the epidural space slowly.  You may experience a sensation of pressure while this is being done.  The entire block usually last less than 10 minutes.  Conditions which may be treated by epidural steroids:   Low back and leg pain  Neck and arm pain  Spinal stenosis  Post-laminectomy syndrome  Herpes zoster (shingles) pain  Pain from compression fractures  Preparation for the injection:  1. Do not eat any solid food or dairy products within 8 hours of your appointment.  2. You may drink clear liquids up to 3 hours before appointment.  Clear liquids include water, black coffee, juice or soda.  No milk or cream please. 3. You may take your regular medication, including pain medications, with a sip of water before your appointment  Diabetics should hold regular insulin (if taken separately) and take 1/2 normal NPH dos the morning of the procedure.  Carry some sugar containing items with you to your appointment. 4. A driver must accompany you and be prepared to drive you home after your procedure.  5. Bring all your current medications with your. 6. An IV may be inserted and sedation may be given at the discretion of the physician.   7. A blood pressure cuff, EKG and other monitors will often be applied during the procedure.  Some patients may need to have extra oxygen administered for a short period. 8. You will be asked to provide medical information, including your allergies, prior to the procedure.  We must know immediately if you are taking blood thinners (like Coumadin/Warfarin)  Or if you are allergic to IV iodine contrast (dye). We  must know if you could possible be pregnant.  Possible side-effects:  Bleeding from needle site  Infection (rare, may require surgery)  Nerve injury (rare)  Numbness & tingling (temporary)  Difficulty urinating (rare, temporary)  Spinal headache (   a headache worse with upright posture)  Light -headedness (temporary)  Pain at injection site (several days)  Decreased blood pressure (temporary)  Weakness in arm/leg (temporary)  Pressure sensation in back/neck (temporary)  Call if you experience:  Fever/chills associated with headache or increased back/neck pain.  Headache worsened by an upright position.  New onset weakness or numbness of an extremity below the injection site  Hives or difficulty breathing (go to the emergency room)  Inflammation or drainage at the infection site  Severe back/neck pain  Any new symptoms which are concerning to you  Please note:  Although the local anesthetic injected can often make your back or neck feel good for several hours after the injection, the pain will likely return.  It takes 3-7 days for steroids to work in the epidural space.  You may not notice any pain relief for at least that one week.  If effective, we will often do a series of three injections spaced 3-6 weeks apart to maximally decrease your pain.  After the initial series, we generally will wait several months before considering a repeat injection of the same type.  If you have any questions, please call [phone] [facility] 

## 2016-02-11 NOTE — Progress Notes (Signed)
Subjective:  Patient ID: Christina Zamora, female    DOB: 01/12/1961  Age: 55 y.o. MRN: [id number]  CC: Back Pain  Procedure: L5-S1 lumbar epidural steroid #2 with previous epidural #1 on 12/05/2014  HPI Christina Zamora presents today for reevaluation and her second lumbar epidural steroid injection. The quality and characteristic of distribution of her pain otherwise stable in nature with a 50% reduction in her low back pain and 75% reduction in her lower extremity pain. She desires to proceed with a repeat injection today.   History Christina Zamora has a past medical history of Hypertension; Hyperlipidemia; GERD (gastroesophageal reflux disease); Arthritis; CAD (coronary artery disease); Vitamin D deficiency; Thyroid disease; Lumbago; and Insomnia.   She has past surgical history that includes Coronary angioplasty with stent; Bilateral carpal tunnel release (2000); and Replacement total knee (Left, 2015).   Her family history includes Diabetes in her mother; Heart attack in her father; Heart failure in her mother; Hyperlipidemia in her mother.She reports that she has quit smoking. She does not have any smokeless tobacco history on file. She reports that she does not drink alcohol or use illicit drugs.   ---------------------------------------------------------------------------------------------------------------------- Past Medical History  Diagnosis Date  . Hypertension   . Hyperlipidemia   . GERD (gastroesophageal reflux disease)   . Arthritis   . CAD (coronary artery disease)   . Vitamin D deficiency   . Thyroid disease   . Lumbago   . Insomnia     Past Surgical History  Procedure Laterality Date  . Coronary angioplasty with stent placement    . Bilateral carpal tunnel release  2000  . Replacement total knee Left 2015    Family History  Problem Relation Age of Onset  . Heart failure Mother   . Diabetes Mother   . Hyperlipidemia Mother   . Heart attack Father     Social  History  Substance Use Topics  . Smoking status: Former Smoker -- 0.50 packs/day for 20 years  . Smokeless tobacco: Not on file  . Alcohol Use: No    ---------------------------------------------------------------------------------------------------------------------- Social History   Social History  . Marital Status: Married    Spouse Name: N/A  . Number of Children: N/A  . Years of Education: N/A   Social History Main Topics  . Smoking status: Former Smoker -- 0.50 packs/day for 20 years  . Smokeless tobacco: None  . Alcohol Use: No  . Drug Use: No  . Sexual Activity: Not Asked   Other Topics Concern  . None   Social History Narrative      ----------------------------------------------------------------------------------------------------------------------  ROS Review of Systems  Heart reveals a history of high blood pressure previous heart catheterization and she is on Lidex. Ulnar significant for snoring Neurologic per scoliosis Psychologic history depression GI history of reflux GU negative Hematologic with easy bruising Endocrine for thyroid disease and hypothyroid state   Objective:  BP 145/92 mmHg  Pulse 86  Temp(Src) 98.2 F (36.8 C) (Oral)  Resp 16  Ht 5\' 4"  (1.626 m)  Wt 195 lb (88.451 kg)  BMI 33.46 kg/m2  SpO2 99%  Physical Exam  Patient is alert oriented cooperative and a decent historian Heart is regular rate and rhythm without murmur Lungs are clear to auscultation Inspection low back reveals some paraspinous muscle tenderness.Otherwise no changes are noted today.   Assessment & Plan:   So was seen today for back pain.  Diagnoses and all orders for this visit:  DDD (degenerative disc disease), lumbar -  LUMBAR EPIDURAL STEROID INJECTION; Future -     triamcinolone acetonide (KENALOG-40) injection 40 mg; 1 mL (40 mg total) by Other route once. -     sodium chloride flush (NS) 0.9 % injection 10 mL; 10 mLs by Other route  once. -     ropivacaine (PF) 2 mg/ml (0.2%) (NAROPIN) epidural 10 mL; 10 mLs by Epidural route once. -     lidocaine (PF) (XYLOCAINE) 1 % injection 5 mL; Inject 5 mLs into the skin once. -     iopamidol (ISOVUE-M) 41 % intrathecal injection 20 mL; 20 mLs by Other route once as needed for contrast.  Sciatica associated with disorder of lumbar spine, left -     LUMBAR EPIDURAL STEROID INJECTION; Future -     triamcinolone acetonide (KENALOG-40) injection 40 mg; 1 mL (40 mg total) by Other route once. -     sodium chloride flush (NS) 0.9 % injection 10 mL; 10 mLs by Other route once. -     ropivacaine (PF) 2 mg/ml (0.2%) (NAROPIN) epidural 10 mL; 10 mLs by Epidural route once. -     lidocaine (PF) (XYLOCAINE) 1 % injection 5 mL; Inject 5 mLs into the skin once. -     iopamidol (ISOVUE-M) 41 % intrathecal injection 20 mL; 20 mLs by Other route once as needed for contrast.  Sciatica associated with disorder of lumbar spine, right -     LUMBAR EPIDURAL STEROID INJECTION; Future -     triamcinolone acetonide (KENALOG-40) injection 40 mg; 1 mL (40 mg total) by Other route once. -     sodium chloride flush (NS) 0.9 % injection 10 mL; 10 mLs by Other route once. -     ropivacaine (PF) 2 mg/ml (0.2%) (NAROPIN) epidural 10 mL; 10 mLs by Epidural route once. -     lidocaine (PF) (XYLOCAINE) 1 % injection 5 mL; Inject 5 mLs into the skin once. -     iopamidol (ISOVUE-M) 41 % intrathecal injection 20 mL; 20 mLs by Other route once as needed for contrast.     ----------------------------------------------------------------------------------------------------------------------  Problem List Items Addressed This Visit    None    Visit Diagnoses    DDD (degenerative disc disease), lumbar    -  Primary    Relevant Medications    triamcinolone acetonide (KENALOG-40) injection 40 mg (Completed)    sodium chloride flush (NS) 0.9 % injection 10 mL (Completed)    ropivacaine (PF) 2 mg/ml (0.2%) (NAROPIN)  epidural 10 mL (Completed)    lidocaine (PF) (XYLOCAINE) 1 % injection 5 mL (Completed)    iopamidol (ISOVUE-M) 41 % intrathecal injection 20 mL    Other Relevant Orders    LUMBAR EPIDURAL STEROID INJECTION    Sciatica associated with disorder of lumbar spine, left        Relevant Medications    triamcinolone acetonide (KENALOG-40) injection 40 mg (Completed)    sodium chloride flush (NS) 0.9 % injection 10 mL (Completed)    ropivacaine (PF) 2 mg/ml (0.2%) (NAROPIN) epidural 10 mL (Completed)    lidocaine (PF) (XYLOCAINE) 1 % injection 5 mL (Completed)    iopamidol (ISOVUE-M) 41 % intrathecal injection 20 mL    Other Relevant Orders    LUMBAR EPIDURAL STEROID INJECTION    Sciatica associated with disorder of lumbar spine, right        Relevant Medications    triamcinolone acetonide (KENALOG-40) injection 40 mg (Completed)    sodium chloride flush (NS) 0.9 % injection 10 mL (  Completed)    ropivacaine (PF) 2 mg/ml (0.2%) (NAROPIN) epidural 10 mL (Completed)    lidocaine (PF) (XYLOCAINE) 1 % injection 5 mL (Completed)    iopamidol (ISOVUE-M) 41 % intrathecal injection 20 mL    Other Relevant Orders    LUMBAR EPIDURAL STEROID INJECTION       ----------------------------------------------------------------------------------------------------------------------  1. DDD (degenerative disc disease), lumbar We will perform herSecond epidural injection today. The risks and benefits once again reviewed all questions answered and no guarantees were made. We will have her return to clinic in 1 month for reevaluation probable repeat epidural at that time depending on how she responds to this we may consider ordering MRI for further evaluation though at this point she appears to be making good progress- LUMBAR EPIDURAL STEROID INJECTION; Future  2. Sciatica associated with disorder of lumbar spine, left Same - LUMBAR EPIDURAL STEROID INJECTION; Future  3. Sciatica associated with disorder of  lumbar spine, right  - LUMBAR EPIDURAL STEROID INJECTION; Future  4. Facet arthritis of lumbosacral region Patient may be a candidate for facet injection pending her response to epidural steroid injection. Meantime on her to continue with her back stretching strengthening exercises as described her in detail today and ultimately she may be a candidate for further physical therapy intervention    ----------------------------------------------------------------------------------------------------------------------  I am having Christina Zamora maintain her clopidogrel, atorvastatin, buPROPion, celecoxib, levothyroxine, ramipril, amLODipine, pantoprazole, traZODone, nitroGLYCERIN, aspirin, multivitamin, Vitamin D (Ergocalciferol), Cholecalciferol (VITAMIN D PO), and traMADol. We administered triamcinolone acetonide, sodium chloride flush, ropivacaine (PF) 2 mg/ml (0.2%), and lidocaine (PF). We will continue to administer sodium chloride flush, iohexol, and iopamidol.   Meds ordered this encounter  Medications  . triamcinolone acetonide (KENALOG-40) injection 40 mg    Sig:   . sodium chloride flush (NS) 0.9 % injection 10 mL    Sig:   . ropivacaine (PF) 2 mg/ml (0.2%) (NAROPIN) epidural 10 mL    Sig:   . lidocaine (PF) (XYLOCAINE) 1 % injection 5 mL    Sig:   . iopamidol (ISOVUE-M) 41 % intrathecal injection 20 mL    Sig:        Follow-up: Return in about 1 month (around 03/09/2016).    Procedure: L5-S1 LESI#2 with fluoroscopic guidance NOTE: The risks, benefits, and expectations of the procedure have been discussed and explained to the patient who was understanding and in agreement with suggested treatment plan. No guarantees were made.  DESCRIPTION OF PROCEDURE: Lumbar epidural steroid injection with IV Versed, EKG, blood pressure, pulse, and pulse oximetry monitoring. The procedure was performed with the patient in the prone position under fluoroscopic guidance. A local anesthetic skin  wheal of 1.5% plain lidocaine was performed at the appropriate site after fluoroscopic identifictation  Using strict aseptic technique, I then advanced an 18-gauge Tuohy epidural needle in the midline via loss-of-resistance to saline. There was negative aspiration for heme or  CSF.  I then confirmed position with both AP and Lateral fluoroscan. At L5-S1  A total of 5 mL of Preservative-Free normal saline with 40 mg of Kenalog and 1cc Ropicaine 0.2 percent was injected incrementally via the  epidurally placed needle. Needle removed. The patient tolerated the injection well and was convalesced and discharged to home in stable condition. If the patient has any post procedure difficulty they have been instructed on how to contact us for assistance.   @[email or web address]  Andree Elk, MD@   Molli Barrows, MD

## 2016-03-20 ENCOUNTER — Ambulatory Visit: Payer: BLUE CROSS/BLUE SHIELD | Attending: Anesthesiology | Admitting: Anesthesiology

## 2016-03-20 ENCOUNTER — Encounter: Payer: Self-pay | Admitting: Anesthesiology

## 2016-03-20 VITALS — BP 171/91 | HR 59 | Temp 98.0°F | Resp 20 | Ht 64.0 in | Wt 191.0 lb

## 2016-03-20 DIAGNOSIS — Z955 Presence of coronary angioplasty implant and graft: Secondary | ICD-10-CM | POA: Insufficient documentation

## 2016-03-20 DIAGNOSIS — I2510 Atherosclerotic heart disease of native coronary artery without angina pectoris: Secondary | ICD-10-CM | POA: Insufficient documentation

## 2016-03-20 DIAGNOSIS — E559 Vitamin D deficiency, unspecified: Secondary | ICD-10-CM | POA: Insufficient documentation

## 2016-03-20 DIAGNOSIS — M5441 Lumbago with sciatica, right side: Secondary | ICD-10-CM | POA: Diagnosis not present

## 2016-03-20 DIAGNOSIS — K219 Gastro-esophageal reflux disease without esophagitis: Secondary | ICD-10-CM | POA: Diagnosis not present

## 2016-03-20 DIAGNOSIS — M5136 Other intervertebral disc degeneration, lumbar region: Secondary | ICD-10-CM | POA: Insufficient documentation

## 2016-03-20 DIAGNOSIS — M5442 Lumbago with sciatica, left side: Secondary | ICD-10-CM | POA: Diagnosis not present

## 2016-03-20 DIAGNOSIS — I10 Essential (primary) hypertension: Secondary | ICD-10-CM | POA: Diagnosis not present

## 2016-03-20 DIAGNOSIS — M5432 Sciatica, left side: Secondary | ICD-10-CM

## 2016-03-20 DIAGNOSIS — M5431 Sciatica, right side: Secondary | ICD-10-CM

## 2016-03-20 DIAGNOSIS — Z96652 Presence of left artificial knee joint: Secondary | ICD-10-CM | POA: Diagnosis not present

## 2016-03-20 DIAGNOSIS — Z87891 Personal history of nicotine dependence: Secondary | ICD-10-CM | POA: Diagnosis not present

## 2016-03-20 DIAGNOSIS — E785 Hyperlipidemia, unspecified: Secondary | ICD-10-CM | POA: Diagnosis not present

## 2016-03-20 DIAGNOSIS — M47817 Spondylosis without myelopathy or radiculopathy, lumbosacral region: Secondary | ICD-10-CM | POA: Insufficient documentation

## 2016-03-20 DIAGNOSIS — G4700 Insomnia, unspecified: Secondary | ICD-10-CM | POA: Insufficient documentation

## 2016-03-20 DIAGNOSIS — M549 Dorsalgia, unspecified: Secondary | ICD-10-CM | POA: Diagnosis present

## 2016-03-20 MED ORDER — IOPAMIDOL (ISOVUE-M 200) INJECTION 41%
INTRAMUSCULAR | Status: AC
  Administered 2016-03-20: 15:00:00
  Filled 2016-03-20: qty 10

## 2016-03-20 MED ORDER — MIDAZOLAM HCL 5 MG/5ML IJ SOLN
5.0000 mg | Freq: Once | INTRAMUSCULAR | Status: AC
  Administered 2016-03-20: 2 mg via INTRAVENOUS
  Filled 2016-03-20: qty 5

## 2016-03-20 MED ORDER — TRIAMCINOLONE ACETONIDE 40 MG/ML IJ SUSP
40.0000 mg | Freq: Once | INTRAMUSCULAR | Status: AC
  Administered 2016-03-20: 40 mg
  Filled 2016-03-20: qty 1

## 2016-03-20 MED ORDER — LIDOCAINE HCL (PF) 1 % IJ SOLN
5.0000 mL | Freq: Once | INTRAMUSCULAR | Status: AC
  Administered 2016-03-20: 5 mL via SUBCUTANEOUS
  Filled 2016-03-20: qty 5

## 2016-03-20 MED ORDER — IOPAMIDOL (ISOVUE-M 200) INJECTION 41%: 20.0000 mL | Freq: Once | INTRAMUSCULAR | Status: DC | PRN

## 2016-03-20 MED ORDER — LACTATED RINGERS IV SOLN: 1000.0000 mL | INTRAVENOUS | Status: AC

## 2016-03-20 MED ORDER — SODIUM CHLORIDE 0.9 % IJ SOLN
INTRAMUSCULAR | Status: AC
  Administered 2016-03-20: 15:00:00
  Filled 2016-03-20: qty 10

## 2016-03-20 MED ORDER — ROPIVACAINE HCL 2 MG/ML IJ SOLN
10.0000 mL | Freq: Once | INTRAMUSCULAR | Status: AC
  Administered 2016-03-20: 10 mL via EPIDURAL
  Filled 2016-03-20: qty 10

## 2016-03-20 MED ORDER — SODIUM CHLORIDE 0.9% FLUSH
10.0000 mL | Freq: Once | INTRAVENOUS | Status: AC
  Administered 2016-03-20: 10 mL

## 2016-03-20 NOTE — Progress Notes (Addendum)
Subjective:  Patient ID: Christina Zamora, female    DOB: 16-May-1961  Age: 55 y.o. MRN: [id number]  CC: Back Pain  Procedure: L5-S1 lumbar epidural steroid #3   HPI Christina Zamora presents today for reevaluation and her third lumbar epidural steroid injection. She states that she has continued to make good progress. She's having some left anterior thigh pain but overall an 80% reduction in her low back pain. She's been off her Plavix since June 21. No other changes in her lower extremity strength or function or bowel bladder function are noted at this time.   History Christina Zamora has a past medical history of Hypertension; Hyperlipidemia; GERD (gastroesophageal reflux disease); Arthritis; CAD (coronary artery disease); Vitamin D deficiency; Thyroid disease; Lumbago; Insomnia; and Chronic headaches.   She has past surgical history that includes Coronary angioplasty with stent; Bilateral carpal tunnel release (2000); and Replacement total knee (Left, 2015).   Her family history includes Diabetes in her mother; Heart attack in her father; Heart failure in her mother; Hyperlipidemia in her mother.She reports that she has quit smoking. She does not have any smokeless tobacco history on file. She reports that she does not drink alcohol or use illicit drugs.   ---------------------------------------------------------------------------------------------------------------------- Past Medical History  Diagnosis Date  . Hypertension   . Hyperlipidemia   . GERD (gastroesophageal reflux disease)   . Arthritis   . CAD (coronary artery disease)   . Vitamin D deficiency   . Thyroid disease   . Lumbago   . Insomnia   . Chronic headaches     Past Surgical History  Procedure Laterality Date  . Coronary angioplasty with stent placement    . Bilateral carpal tunnel release  2000  . Replacement total knee Left 2015    Family History  Problem Relation Age of Onset  . Heart failure Mother   .  Diabetes Mother   . Hyperlipidemia Mother   . Heart attack Father     Social History  Substance Use Topics  . Smoking status: Former Smoker -- 0.50 packs/day for 20 years  . Smokeless tobacco: Not on file  . Alcohol Use: No    ---------------------------------------------------------------------------------------------------------------------- Social History   Social History  . Marital Status: Married    Spouse Name: N/A  . Number of Children: N/A  . Years of Education: N/A   Social History Main Topics  . Smoking status: Former Smoker -- 0.50 packs/day for 20 years  . Smokeless tobacco: None  . Alcohol Use: No  . Drug Use: No  . Sexual Activity: Not Asked   Other Topics Concern  . None   Social History Narrative      ----------------------------------------------------------------------------------------------------------------------  ROS Review of Systems    Objective:  BP 171/91 mmHg  Pulse 59  Temp(Src) 98 F (36.7 C) (Oral)  Resp 20  Ht 5\' 4"  (1.626 m)  Wt 191 lb (86.637 kg)  BMI 32.77 kg/m2  SpO2 100%  Physical Exam  Patient is alert oriented cooperative and a decent historian Heart is regular rate and rhythm without murmur Lungs are clear to auscultation Inspection low back reveals some paraspinous muscle tenderness.Otherwise no changes are noted today.   Assessment & Plan:   Christina Zamora was seen today for back pain.  Diagnoses and all orders for this visit:  DDD (degenerative disc disease), lumbar -     triamcinolone acetonide (KENALOG-40) injection 40 mg; 1 mL (40 mg total) by Other route once. -     sodium chloride flush (  NS) 0.9 % injection 10 mL; 10 mLs by Other route once. -     ropivacaine (PF) 2 mg/ml (0.2%) (NAROPIN) epidural 10 mL; 10 mLs by Epidural route once. -     midazolam (VERSED) 5 MG/5ML injection 5 mg; Inject 5 mLs (5 mg total) into the vein once. -     lidocaine (PF) (XYLOCAINE) 1 % injection 5 mL; Inject 5 mLs into the skin  once. -     lactated ringers infusion 1,000 mL; Inject 1,000 mLs into the vein continuous. -     iopamidol (ISOVUE-M) 41 % intrathecal injection 20 mL; 20 mLs by Other route once as needed for contrast.  Sciatica associated with disorder of lumbar spine, left -     triamcinolone acetonide (KENALOG-40) injection 40 mg; 1 mL (40 mg total) by Other route once. -     sodium chloride flush (NS) 0.9 % injection 10 mL; 10 mLs by Other route once. -     ropivacaine (PF) 2 mg/ml (0.2%) (NAROPIN) epidural 10 mL; 10 mLs by Epidural route once. -     midazolam (VERSED) 5 MG/5ML injection 5 mg; Inject 5 mLs (5 mg total) into the vein once. -     lidocaine (PF) (XYLOCAINE) 1 % injection 5 mL; Inject 5 mLs into the skin once. -     lactated ringers infusion 1,000 mL; Inject 1,000 mLs into the vein continuous. -     iopamidol (ISOVUE-M) 41 % intrathecal injection 20 mL; 20 mLs by Other route once as needed for contrast.  Sciatica associated with disorder of lumbar spine, right  Other orders -     iopamidol (ISOVUE-M) 41 % intrathecal injection;  -     sodium chloride 0.9 % injection;      ----------------------------------------------------------------------------------------------------------------------  Problem List Items Addressed This Visit    None    Visit Diagnoses    DDD (degenerative disc disease), lumbar    -  Primary    Relevant Medications    triamcinolone acetonide (KENALOG-40) injection 40 mg (Completed)    sodium chloride flush (NS) 0.9 % injection 10 mL (Completed)    ropivacaine (PF) 2 mg/ml (0.2%) (NAROPIN) epidural 10 mL (Completed)    midazolam (VERSED) 5 MG/5ML injection 5 mg (Completed)    lidocaine (PF) (XYLOCAINE) 1 % injection 5 mL (Completed)    lactated ringers infusion 1,000 mL    iopamidol (ISOVUE-M) 41 % intrathecal injection 20 mL    Sciatica associated with disorder of lumbar spine, left        Relevant Medications    triamcinolone acetonide (KENALOG-40)  injection 40 mg (Completed)    sodium chloride flush (NS) 0.9 % injection 10 mL (Completed)    ropivacaine (PF) 2 mg/ml (0.2%) (NAROPIN) epidural 10 mL (Completed)    midazolam (VERSED) 5 MG/5ML injection 5 mg (Completed)    lidocaine (PF) (XYLOCAINE) 1 % injection 5 mL (Completed)    lactated ringers infusion 1,000 mL    iopamidol (ISOVUE-M) 41 % intrathecal injection 20 mL    Sciatica associated with disorder of lumbar spine, right        Relevant Medications    midazolam (VERSED) 5 MG/5ML injection 5 mg (Completed)       ----------------------------------------------------------------------------------------------------------------------  1. DDD (degenerative disc disease), lumbar We will perform her third epidural injection today. The risks and benefits once again reviewed all questions answered and no guarantees were made. We will have her return to clinic in 2 month for reevaluation  2.  Sciatica associated with disorder of lumbar spine, left Same 3. Sciatica associated with disorder of lumbar spine, right  She is to restart her Plavix tomorrow 4. Facet arthritis of lumbosacral region Patient may be a candidate for facet injection pending her response to epidural steroid injection. Meantime on her to continue with her back stretching strengthening exercises as described her in detail today and ultimately she may be a candidate for further physical therapy intervention    ----------------------------------------------------------------------------------------------------------------------  I am having Christina Zamora maintain her clopidogrel, atorvastatin, buPROPion, celecoxib, levothyroxine, ramipril, amLODipine, pantoprazole, traZODone, nitroGLYCERIN, aspirin, multivitamin, Vitamin D (Ergocalciferol), Cholecalciferol (VITAMIN D PO), and traMADol. We administered triamcinolone acetonide, sodium chloride flush, ropivacaine (PF) 2 mg/ml (0.2%), midazolam, lidocaine (PF), iopamidol, and  sodium chloride. We will continue to administer sodium chloride flush, iohexol, iopamidol, lactated ringers, and iopamidol.   Meds ordered this encounter  Medications  . triamcinolone acetonide (KENALOG-40) injection 40 mg    Sig:   . sodium chloride flush (NS) 0.9 % injection 10 mL    Sig:   . ropivacaine (PF) 2 mg/ml (0.2%) (NAROPIN) epidural 10 mL    Sig:   . midazolam (VERSED) 5 MG/5ML injection 5 mg    Sig:   . lidocaine (PF) (XYLOCAINE) 1 % injection 5 mL    Sig:   . lactated ringers infusion 1,000 mL    Sig:   . iopamidol (ISOVUE-M) 41 % intrathecal injection 20 mL    Sig:   . iopamidol (ISOVUE-M) 41 % intrathecal injection    Sig:     Sharlett Iles, Delores: cabinet override  . sodium chloride 0.9 % injection    Sig:     Sharlett Iles, Delores: cabinet override       Follow-up: Return in about 3 months (around 06/20/2016) for evaluation, med refill.    Procedure: L5-S1 LESI#3 with fluoroscopic guidance NOTE: The risks, benefits, and expectations of the procedure have been discussed and explained to the patient who was understanding and in agreement with suggested treatment plan. No guarantees were made.  DESCRIPTION OF PROCEDURE: Lumbar epidural steroid injection with IV Versed, EKG, blood pressure, pulse, and pulse oximetry monitoring. The procedure was performed with the patient in the prone position under fluoroscopic guidance. A local anesthetic skin wheal of 1.5% plain lidocaine was performed at the appropriate site after fluoroscopic identifictation  Using strict aseptic technique, I then advanced an 18-gauge Tuohy epidural needle in the midline via loss-of-resistance to saline. There was negative aspiration for heme or  CSF.  I then confirmed position with both AP and Lateral fluoroscan. At L5-S1  A total of 5 mL of Preservative-Free normal saline with 40 mg of Kenalog and 1cc Ropicaine 0.2 percent was injected incrementally via the  epidurally placed needle. Needle removed.  The patient tolerated the injection well and was convalesced and discharged to home in stable condition. If the patient has any post procedure difficulty they have been instructed on how to contact us for assistance.   @[email or web address]  Andree Elk, MD@   Molli Barrows, MD

## 2016-03-20 NOTE — Patient Instructions (Signed)

## 2016-03-20 NOTE — Progress Notes (Signed)
Safety precautions to be maintained throughout the outpatient stay will include: orient to surroundings, keep bed in low position, maintain call bell within reach at all times, provide assistance with transfer out of bed and ambulation.  

## 2016-03-21 ENCOUNTER — Telehealth: Payer: Self-pay

## 2016-03-21 NOTE — Telephone Encounter (Signed)
Man answered phone and states that cyera went to work today- encouraged to call if needed

## 2016-06-20 ENCOUNTER — Other Ambulatory Visit: Payer: Self-pay | Admitting: Anesthesiology

## 2016-07-01 ENCOUNTER — Encounter: Payer: Self-pay | Admitting: Anesthesiology

## 2016-07-10 ENCOUNTER — Telehealth: Payer: Self-pay

## 2016-07-10 NOTE — Telephone Encounter (Signed)
She has an appointment on 07-29-16 but she is out of medicine and is hurting. She wants to know if he can call out something to tide her over until her appt. I told her the possibility was slim but I would ask.

## 2016-07-11 NOTE — Telephone Encounter (Signed)
Voicemail left for patient that we do not prescribe without an office visit.  Told patient that she can call to see if there  Is an earlier appt time than 07/29/16 if she would like to try to schedule sooner.

## 2016-07-29 ENCOUNTER — Ambulatory Visit
Admission: RE | Admit: 2016-07-29 | Discharge: 2016-07-29 | Disposition: A | Discharge status: Home / Self Care | Payer: BLUE CROSS/BLUE SHIELD | Source: Ambulatory Visit | Attending: Anesthesiology | Admitting: Anesthesiology

## 2016-07-29 ENCOUNTER — Encounter: Payer: Self-pay | Admitting: Anesthesiology

## 2016-07-29 ENCOUNTER — Other Ambulatory Visit: Payer: Self-pay | Admitting: Anesthesiology

## 2016-07-29 ENCOUNTER — Ambulatory Visit (HOSPITAL_BASED_OUTPATIENT_CLINIC_OR_DEPARTMENT_OTHER): Payer: BLUE CROSS/BLUE SHIELD | Admitting: Anesthesiology

## 2016-07-29 VITALS — BP 153/97 | HR 71 | Temp 98.7°F | Resp 16 | Ht 61.0 in | Wt 191.0 lb

## 2016-07-29 DIAGNOSIS — R52 Pain, unspecified: Secondary | ICD-10-CM

## 2016-07-29 DIAGNOSIS — I10 Essential (primary) hypertension: Secondary | ICD-10-CM | POA: Insufficient documentation

## 2016-07-29 DIAGNOSIS — E559 Vitamin D deficiency, unspecified: Secondary | ICD-10-CM | POA: Diagnosis not present

## 2016-07-29 DIAGNOSIS — Z96652 Presence of left artificial knee joint: Secondary | ICD-10-CM | POA: Insufficient documentation

## 2016-07-29 DIAGNOSIS — M5432 Sciatica, left side: Secondary | ICD-10-CM | POA: Diagnosis not present

## 2016-07-29 DIAGNOSIS — M5386 Other specified dorsopathies, lumbar region: Secondary | ICD-10-CM

## 2016-07-29 DIAGNOSIS — Z8249 Family history of ischemic heart disease and other diseases of the circulatory system: Secondary | ICD-10-CM | POA: Insufficient documentation

## 2016-07-29 DIAGNOSIS — R51 Headache: Secondary | ICD-10-CM | POA: Insufficient documentation

## 2016-07-29 DIAGNOSIS — M5136 Other intervertebral disc degeneration, lumbar region: Secondary | ICD-10-CM | POA: Insufficient documentation

## 2016-07-29 DIAGNOSIS — M539 Dorsopathy, unspecified: Secondary | ICD-10-CM | POA: Diagnosis not present

## 2016-07-29 DIAGNOSIS — Z9889 Other specified postprocedural states: Secondary | ICD-10-CM | POA: Insufficient documentation

## 2016-07-29 DIAGNOSIS — Z87891 Personal history of nicotine dependence: Secondary | ICD-10-CM | POA: Diagnosis not present

## 2016-07-29 DIAGNOSIS — K219 Gastro-esophageal reflux disease without esophagitis: Secondary | ICD-10-CM | POA: Insufficient documentation

## 2016-07-29 DIAGNOSIS — M1990 Unspecified osteoarthritis, unspecified site: Secondary | ICD-10-CM | POA: Diagnosis not present

## 2016-07-29 DIAGNOSIS — E785 Hyperlipidemia, unspecified: Secondary | ICD-10-CM | POA: Diagnosis not present

## 2016-07-29 DIAGNOSIS — G4700 Insomnia, unspecified: Secondary | ICD-10-CM | POA: Diagnosis not present

## 2016-07-29 DIAGNOSIS — I2510 Atherosclerotic heart disease of native coronary artery without angina pectoris: Secondary | ICD-10-CM | POA: Insufficient documentation

## 2016-07-29 DIAGNOSIS — M5442 Lumbago with sciatica, left side: Secondary | ICD-10-CM | POA: Diagnosis not present

## 2016-07-29 DIAGNOSIS — Z7902 Long term (current) use of antithrombotics/antiplatelets: Secondary | ICD-10-CM | POA: Insufficient documentation

## 2016-07-29 DIAGNOSIS — E079 Disorder of thyroid, unspecified: Secondary | ICD-10-CM | POA: Insufficient documentation

## 2016-07-29 DIAGNOSIS — Z955 Presence of coronary angioplasty implant and graft: Secondary | ICD-10-CM | POA: Diagnosis not present

## 2016-07-29 DIAGNOSIS — M51369 Other intervertebral disc degeneration, lumbar region without mention of lumbar back pain or lower extremity pain: Secondary | ICD-10-CM

## 2016-07-29 MED ORDER — LIDOCAINE HCL (PF) 1 % IJ SOLN
5.0000 mL | Freq: Once | INTRAMUSCULAR | Status: AC
  Administered 2016-07-29: 5 mL via SUBCUTANEOUS
  Filled 2016-07-29: qty 5

## 2016-07-29 MED ORDER — TRIAMCINOLONE ACETONIDE 40 MG/ML IJ SUSP
40.0000 mg | Freq: Once | INTRAMUSCULAR | Status: AC
  Administered 2016-07-29: 40 mg
  Filled 2016-07-29: qty 1

## 2016-07-29 MED ORDER — IOPAMIDOL (ISOVUE-M 200) INJECTION 41%
INTRAMUSCULAR | Status: AC
  Administered 2016-07-29: 14:00:00
  Filled 2016-07-29: qty 10

## 2016-07-29 MED ORDER — SODIUM CHLORIDE 0.9 % IJ SOLN
INTRAMUSCULAR | Status: AC
  Administered 2016-07-29: 14:00:00
  Filled 2016-07-29: qty 10

## 2016-07-29 MED ORDER — ROPIVACAINE HCL 2 MG/ML IJ SOLN
10.0000 mL | Freq: Once | INTRAMUSCULAR | Status: AC
  Administered 2016-07-29: 10 mL via EPIDURAL
  Filled 2016-07-29: qty 10

## 2016-07-29 MED ORDER — TRAMADOL HCL 50 MG PO TABS: 100.0000 mg | ORAL_TABLET | Freq: Three times a day (TID) | ORAL | 2 refills | Status: DC

## 2016-07-29 MED ORDER — IOPAMIDOL (ISOVUE-M 200) INJECTION 41%: 20.0000 mL | Freq: Once | INTRAMUSCULAR | Status: DC | PRN

## 2016-07-29 MED ORDER — SODIUM CHLORIDE 0.9% FLUSH: 10.0000 mL | Freq: Once | INTRAVENOUS | Status: DC

## 2016-07-29 NOTE — Patient Instructions (Signed)
Pain Management Discharge Instructions  General Discharge Instructions :  If you need to reach your doctor call: Monday-Friday 8:00 am - 4:00 pm at [phone] or toll free [phone].  After clinic hours [phone] to have operator reach doctor.  Bring all of your medication bottles to all your appointments in the pain clinic.  To cancel or reschedule your appointment with Pain Management please remember to call 24 hours in advance to avoid a fee.  Refer to the educational materials which you have been given on: General Risks, I had my Procedure. Discharge Instructions, Post Sedation.  Post Procedure Instructions:  The drugs you were given will stay in your system until tomorrow, so for the next 24 hours you should not drive, make any legal decisions or drink any alcoholic beverages.  You may eat anything you prefer, but it is better to start with liquids then soups and crackers, and gradually work up to solid foods.  Please notify your doctor immediately if you have any unusual bleeding, trouble breathing or pain that is not related to your normal pain.  Depending on the type of procedure that was done, some parts of your body may feel week and/or numb.  This usually clears up by tonight or the next day.  Walk with the use of an assistive device or accompanied by an adult for the 24 hours.  You may use ice on the affected area for the first 24 hours.  Put ice in a Ziploc bag and cover with a towel and place against area 15 minutes on 15 minutes off.  You may switch to heat after 24 hours.Epidural Steroid Injection Patient Information  Description: The epidural space surrounds the nerves as they exit the spinal cord.  In some patients, the nerves can be compressed and inflamed by a bulging disc or a tight spinal canal (spinal stenosis).  By injecting steroids into the epidural space, we can bring irritated nerves into direct contact with a potentially helpful medication.  These  steroids act directly on the irritated nerves and can reduce swelling and inflammation which often leads to decreased pain.  Epidural steroids may be injected anywhere along the spine and from the neck to the low back depending upon the location of your pain.   After numbing the skin with local anesthetic (like Novocaine), a small needle is passed into the epidural space slowly.  You may experience a sensation of pressure while this is being done.  The entire block usually last less than 10 minutes.  Conditions which may be treated by epidural steroids:   Low back and leg pain  Neck and arm pain  Spinal stenosis  Post-laminectomy syndrome  Herpes zoster (shingles) pain  Pain from compression fractures  Preparation for the injection:  1. Do not eat any solid food or dairy products within 8 hours of your appointment.  2. You may drink clear liquids up to 3 hours before appointment.  Clear liquids include water, black coffee, juice or soda.  No milk or cream please. 3. You may take your regular medication, including pain medications, with a sip of water before your appointment  Diabetics should hold regular insulin (if taken separately) and take 1/2 normal NPH dos the morning of the procedure.  Carry some sugar containing items with you to your appointment. 4. A driver must accompany you and be prepared to drive you home after your procedure.  5. Bring all your current medications with your. 6. An IV may be inserted and   sedation may be given at the discretion of the physician.   7. A blood pressure cuff, EKG and other monitors will often be applied during the procedure.  Some patients may need to have extra oxygen administered for a short period. 8. You will be asked to provide medical information, including your allergies, prior to the procedure.  We must know immediately if you are taking blood thinners (like Coumadin/Warfarin)  Or if you are allergic to IV iodine contrast (dye). We must  know if you could possible be pregnant.  Possible side-effects:  Bleeding from needle site  Infection (rare, may require surgery)  Nerve injury (rare)  Numbness & tingling (temporary)  Difficulty urinating (rare, temporary)  Spinal headache ( a headache worse with upright posture)  Light -headedness (temporary)  Pain at injection site (several days)  Decreased blood pressure (temporary)  Weakness in arm/leg (temporary)  Pressure sensation in back/neck (temporary)  Call if you experience:  Fever/chills associated with headache or increased back/neck pain.  Headache worsened by an upright position.  New onset weakness or numbness of an extremity below the injection site  Hives or difficulty breathing (go to the emergency room)  Inflammation or drainage at the infection site  Severe back/neck pain  Any new symptoms which are concerning to you  Please note:  Although the local anesthetic injected can often make your back or neck feel good for several hours after the injection, the pain will likely return.  It takes 3-7 days for steroids to work in the epidural space.  You may not notice any pain relief for at least that one week.  If effective, we will often do a series of three injections spaced 3-6 weeks apart to maximally decrease your pain.  After the initial series, we generally will wait several months before considering a repeat injection of the same type.  If you have any questions, please call [phone] [facility] 

## 2016-07-29 NOTE — Progress Notes (Signed)
Safety precautions to be maintained throughout the outpatient stay will include: orient to surroundings, keep bed in low position, maintain call bell within reach at all times, provide assistance with transfer out of bed and ambulation.  

## 2016-07-29 NOTE — Progress Notes (Signed)
Subjective:  Patient ID: Christina Zamora, female    DOB: 1961/08/07  Age: 55 y.o. MRN: [id number]  CC: Back Pain (lower)  Procedure: L5-S1 lumbar epidural steroid #1  Previous procedure: Previous series of lumbar epidural steroids in spring 2017  HPI Christina Zamora presents for reevaluation. She has had recurrence of the primary pain complaint affecting her low back centrally with radiation down the posterior lateral portion of the left leg or the right. In the spring of this year she has series of epidural steroids or not this down by 60-70% the point where she is sleeping better and more functional. She now presents with recurrence of the same quality characteristic addition we should've pain as previously documented. She is desiring to proceed with a repeat epidural today. She's been off her Plavix for one week. No change in lower show strength function or bowel bladder function are noted. She had been taking Ultram as well and this was effective for her.   History Christina Zamora has a past medical history of Arthritis; CAD (coronary artery disease); Chronic headaches; GERD (gastroesophageal reflux disease); Hyperlipidemia; Hypertension; Insomnia; Lumbago; Thyroid disease; and Vitamin D deficiency.   She has a past surgical history that includes Coronary angioplasty with stent; Bilateral carpal tunnel release (2000); and Replacement total knee (Left, 2015).   Her family history includes Diabetes in her mother; Heart attack in her father; Heart failure in her mother; Hyperlipidemia in her mother.She reports that she has quit smoking. She has a 10.00 pack-year smoking history. She does not have any smokeless tobacco history on file. She reports that she does not drink alcohol or use drugs.   ---------------------------------------------------------------------------------------------------------------------- Past Medical History:  Diagnosis Date  . Arthritis   . CAD (coronary artery disease)    . Chronic headaches   . GERD (gastroesophageal reflux disease)   . Hyperlipidemia   . Hypertension   . Insomnia   . Lumbago   . Thyroid disease   . Vitamin D deficiency     Past Surgical History:  Procedure Laterality Date  . BILATERAL CARPAL TUNNEL RELEASE  2000  . CORONARY ANGIOPLASTY WITH STENT PLACEMENT    . REPLACEMENT TOTAL KNEE Left 2015    Family History  Problem Relation Age of Onset  . Heart failure Mother   . Diabetes Mother   . Hyperlipidemia Mother   . Heart attack Father     Social History  Substance Use Topics  . Smoking status: Former Smoker    Packs/day: 0.50    Years: 20.00  . Smokeless tobacco: Not on file  . Alcohol use No    ---------------------------------------------------------------------------------------------------------------------- Social History   Social History  . Marital status: Married    Spouse name: N/A  . Number of children: N/A  . Years of education: N/A   Social History Main Topics  . Smoking status: Former Smoker    Packs/day: 0.50    Years: 20.00  . Smokeless tobacco: None  . Alcohol use No  . Drug use: No  . Sexual activity: Not Asked   Other Topics Concern  . None   Social History Narrative  . None      ----------------------------------------------------------------------------------------------------------------------  ROS Review of Systems    Objective:  BP (!) 153/97   Pulse 71   Temp 98.7 F (37.1 C)   Resp 16   Ht 5\' 1"  (1.549 m)   Wt 191 lb (86.6 kg)   SpO2 100%   BMI 36.09 kg/m   Physical Exam  Patient is alert oriented cooperative and a decent historian Heart is regular rate and rhythm without murmur Lungs are clear to auscultation Inspection low back reveals some paraspinous muscle tenderness.She reports pain in the calf with positive straight leg raise symptoms   Assessment & Plan:   Christina Zamora was seen today for back pain.  Diagnoses and all orders for this visit:  DDD  (degenerative disc disease), lumbar -     LUMBAR EPIDURAL STEROID INJECTION  Sciatica of left side -     triamcinolone acetonide (KENALOG-40) injection 40 mg; 1 mL (40 mg total) by Other route once. -     sodium chloride flush (NS) 0.9 % injection 10 mL; 10 mLs by Other route once. -     ropivacaine (PF) 2 mg/ml (0.2%) (NAROPIN) epidural 10 mL; 10 mLs by Epidural route once. -     lidocaine (PF) (XYLOCAINE) 1 % injection 5 mL; Inject 5 mLs into the skin once. -     iopamidol (ISOVUE-M) 41 % intrathecal injection 20 mL; 20 mLs by Other route once as needed for contrast.  Sciatica of left side associated with disorder of lumbar spine -     LUMBAR EPIDURAL STEROID INJECTION  Sciatica of right side associated with disorder of lumbar spine -     LUMBAR EPIDURAL STEROID INJECTION  Other orders -     traMADol (ULTRAM) 50 MG tablet; Take 2 tablets (100 mg total) by mouth 3 (three) times daily. -     iopamidol (ISOVUE-M) 41 % intrathecal injection;  -     sodium chloride 0.9 % injection;      ----------------------------------------------------------------------------------------------------------------------  Problem List Items Addressed This Visit    None    Visit Diagnoses    DDD (degenerative disc disease), lumbar    -  Primary   Relevant Medications   aspirin EC 81 MG tablet   triamcinolone acetonide (KENALOG-40) injection 40 mg (Completed)   traMADol (ULTRAM) 50 MG tablet   Sciatica of left side       Relevant Medications   buPROPion (WELLBUTRIN SR) 150 MG 12 hr tablet   gabapentin (NEURONTIN) 300 MG capsule   buPROPion (WELLBUTRIN SR) 150 MG 12 hr tablet   triamcinolone acetonide (KENALOG-40) injection 40 mg (Completed)   sodium chloride flush (NS) 0.9 % injection 10 mL   ropivacaine (PF) 2 mg/ml (0.2%) (NAROPIN) epidural 10 mL (Completed)   lidocaine (PF) (XYLOCAINE) 1 % injection 5 mL (Completed)   iopamidol (ISOVUE-M) 41 % intrathecal injection 20 mL   Sciatica of left  side associated with disorder of lumbar spine       Relevant Medications   buPROPion (WELLBUTRIN SR) 150 MG 12 hr tablet   gabapentin (NEURONTIN) 300 MG capsule   buPROPion (WELLBUTRIN SR) 150 MG 12 hr tablet   Sciatica of right side associated with disorder of lumbar spine       Relevant Medications   buPROPion (WELLBUTRIN SR) 150 MG 12 hr tablet   gabapentin (NEURONTIN) 300 MG capsule   buPROPion (WELLBUTRIN SR) 150 MG 12 hr tablet      ----------------------------------------------------------------------------------------------------------------------  1. DDD (degenerative disc disease), lumbar Admitting is reasonable to proceed with a repeat epidural steroid injection today with return to clinic in 6 weeks for reevaluation possible repeat injection at that time if indicated. In the meantime on her to continue with stretching strengthening exercises. Depending on how she responds to today's injection we may consider an MRI for further evaluation. Currently she does not present  with weakness or bowel or bladder dysfunction.  2. Sciatica associated with disorder of lumbar spine, left Same 3. Sciatica associated with disorder of lumbar spine, right  She is to restart her Plavix tomorrow 4. Facet arthritis of lumbosacral region Patient may be a candidate for facet injection pending her response to epidural steroid injection. Meantime on her to continue with her back stretching strengthening exercises as described her in detail today and ultimately she may be a candidate for further physical therapy intervention    ----------------------------------------------------------------------------------------------------------------------  I am having Christina Zamora maintain her clopidogrel, atorvastatin, buPROPion, celecoxib, levothyroxine, ramipril, amLODipine, pantoprazole, traZODone, nitroGLYCERIN, aspirin, multivitamin, Vitamin D (Ergocalciferol), Cholecalciferol (VITAMIN D PO), acyclovir,  buPROPion, enoxaparin, gabapentin, levothyroxine, amLODipine, aspirin EC, buPROPion, levothyroxine, ramipril, and traMADol. We administered triamcinolone acetonide, ropivacaine (PF) 2 mg/ml (0.2%), lidocaine (PF), iopamidol, and sodium chloride. We will continue to administer sodium chloride flush, iohexol, iopamidol, lactated ringers, iopamidol, sodium chloride flush, and iopamidol.   Meds ordered this encounter  Medications  . acyclovir (ZOVIRAX) 400 MG tablet  . buPROPion (WELLBUTRIN SR) 150 MG 12 hr tablet  . enoxaparin (LOVENOX) 40 MG/0.4ML injection    Sig: Inject 40 mg into the skin.  Marland Kitchen gabapentin (NEURONTIN) 300 MG capsule  . levothyroxine (SYNTHROID, LEVOTHROID) 50 MCG tablet  . amLODipine (NORVASC) 10 MG tablet    Sig: Take 10 mg by mouth.  Marland Kitchen aspirin EC 81 MG tablet    Sig: Take 81 mg by mouth.  Marland Kitchen buPROPion (WELLBUTRIN SR) 150 MG 12 hr tablet    Sig: Take 150 mg by mouth.  . levothyroxine (SYNTHROID, LEVOTHROID) 200 MCG tablet    Sig: Take by mouth.  . ramipril (ALTACE) 10 MG capsule    Sig: Take 10 mg by mouth.  . triamcinolone acetonide (KENALOG-40) injection 40 mg  . sodium chloride flush (NS) 0.9 % injection 10 mL  . ropivacaine (PF) 2 mg/ml (0.2%) (NAROPIN) epidural 10 mL  . lidocaine (PF) (XYLOCAINE) 1 % injection 5 mL  . iopamidol (ISOVUE-M) 41 % intrathecal injection 20 mL  . traMADol (ULTRAM) 50 MG tablet    Sig: Take 2 tablets (100 mg total) by mouth 3 (three) times daily.    Dispense:  180 tablet    Refill:  2  . iopamidol (ISOVUE-M) 41 % intrathecal injection    Sharlett Iles, Delores: cabinet override  . sodium chloride 0.9 % injection    Sharlett Iles, Delores: cabinet override       Follow-up: Return in about 6 weeks (around 09/09/2016) for evaluation, procedure.    Procedure: L5-S1 LESI#1 with fluoroscopic guidance without sedation NOTE: The risks, benefits, and expectations of the procedure have been discussed and explained to the patient who was  understanding and in agreement with suggested treatment plan. No guarantees were made.  DESCRIPTION OF PROCEDURE: Lumbar epidural steroid injection with IV Versed, EKG, blood pressure, pulse, and pulse oximetry monitoring. The procedure was performed with the patient in the prone position under fluoroscopic guidance. A local anesthetic skin wheal of 1.5% plain lidocaine was performed at the appropriate site after fluoroscopic identifictation  Using strict aseptic technique, I then advanced an 18-gauge Tuohy epidural needle in the midline via loss-of-resistance to saline. There was negative aspiration for heme or  CSF.  I then confirmed position with both AP and Lateral fluoroscan. At L5-S1  A total of 5 mL of Preservative-Free normal saline with 40 mg of Kenalog and 1cc Ropicaine 0.2 percent was injected incrementally via the  epidurally placed needle. Needle  removed. The patient tolerated the injection well and was convalesced and discharged to home in stable condition. If the patient has any post procedure difficulty they have been instructed on how to contact us for assistance.   [email or web address]  Andree Elk, MD@   Molli Barrows, MD

## 2016-07-30 ENCOUNTER — Telehealth: Payer: Self-pay | Admitting: *Deleted

## 2016-07-30 NOTE — Telephone Encounter (Signed)
Voicemail left for patient to call if there are any questions or concerns re; procedure on yesterday.  

## 2016-09-17 ENCOUNTER — Ambulatory Visit: Payer: BLUE CROSS/BLUE SHIELD | Admitting: Anesthesiology

## 2016-10-23 ENCOUNTER — Encounter: Payer: Self-pay | Admitting: Anesthesiology

## 2016-10-23 ENCOUNTER — Ambulatory Visit: Payer: 59 | Attending: Anesthesiology | Admitting: Anesthesiology

## 2016-10-23 VITALS — BP 141/68 | HR 83 | Temp 98.2°F | Resp 16 | Ht 63.0 in | Wt 185.0 lb

## 2016-10-23 DIAGNOSIS — E559 Vitamin D deficiency, unspecified: Secondary | ICD-10-CM | POA: Diagnosis not present

## 2016-10-23 DIAGNOSIS — I2510 Atherosclerotic heart disease of native coronary artery without angina pectoris: Secondary | ICD-10-CM | POA: Insufficient documentation

## 2016-10-23 DIAGNOSIS — M5431 Sciatica, right side: Secondary | ICD-10-CM | POA: Diagnosis present

## 2016-10-23 DIAGNOSIS — E785 Hyperlipidemia, unspecified: Secondary | ICD-10-CM | POA: Insufficient documentation

## 2016-10-23 DIAGNOSIS — Z87891 Personal history of nicotine dependence: Secondary | ICD-10-CM | POA: Insufficient documentation

## 2016-10-23 DIAGNOSIS — M5432 Sciatica, left side: Secondary | ICD-10-CM

## 2016-10-23 DIAGNOSIS — Z9889 Other specified postprocedural states: Secondary | ICD-10-CM | POA: Insufficient documentation

## 2016-10-23 DIAGNOSIS — K219 Gastro-esophageal reflux disease without esophagitis: Secondary | ICD-10-CM | POA: Diagnosis not present

## 2016-10-23 DIAGNOSIS — I10 Essential (primary) hypertension: Secondary | ICD-10-CM | POA: Insufficient documentation

## 2016-10-23 DIAGNOSIS — E079 Disorder of thyroid, unspecified: Secondary | ICD-10-CM | POA: Insufficient documentation

## 2016-10-23 DIAGNOSIS — Z8249 Family history of ischemic heart disease and other diseases of the circulatory system: Secondary | ICD-10-CM | POA: Insufficient documentation

## 2016-10-23 DIAGNOSIS — Z833 Family history of diabetes mellitus: Secondary | ICD-10-CM | POA: Insufficient documentation

## 2016-10-23 DIAGNOSIS — Z955 Presence of coronary angioplasty implant and graft: Secondary | ICD-10-CM | POA: Diagnosis not present

## 2016-10-23 DIAGNOSIS — R51 Headache: Secondary | ICD-10-CM | POA: Insufficient documentation

## 2016-10-23 DIAGNOSIS — M5136 Other intervertebral disc degeneration, lumbar region: Secondary | ICD-10-CM | POA: Insufficient documentation

## 2016-10-23 DIAGNOSIS — M539 Dorsopathy, unspecified: Secondary | ICD-10-CM

## 2016-10-23 DIAGNOSIS — M5386 Other specified dorsopathies, lumbar region: Secondary | ICD-10-CM

## 2016-10-23 MED ORDER — TRAMADOL HCL 50 MG PO TABS: 100.0000 mg | ORAL_TABLET | Freq: Three times a day (TID) | ORAL | 2 refills | Status: DC

## 2016-10-23 NOTE — Addendum Note (Signed)
Addended by: Molli Barrows on: 10/23/2016 01:43 PM   Modules accepted: Level of Service

## 2016-10-23 NOTE — Progress Notes (Signed)
Safety precautions to be maintained throughout the outpatient stay will include: orient to surroundings, keep bed in low position, maintain call bell within reach at all times, provide assistance with transfer out of bed and ambulation.  

## 2016-10-23 NOTE — Patient Instructions (Signed)
GENERAL RISKS AND COMPLICATIONS  What are the risk, side effects and possible complications? Generally speaking, most procedures are safe.  However, with any procedure there are risks, side effects, and the possibility of complications.  The risks and complications are dependent upon the sites that are lesioned, or the type of nerve block to be performed.  The closer the procedure is to the spine, the more serious the risks are.  Great care is taken when placing the radio frequency needles, block needles or lesioning probes, but sometimes complications can occur. 1. Infection: Any time there is an injection through the skin, there is a risk of infection.  This is why sterile conditions are used for these blocks.  There are four possible types of infection. 1. Localized skin infection. 2. Central Nervous System Infection-This can be in the form of Meningitis, which can be deadly. 3. Epidural Infections-This can be in the form of an epidural abscess, which can cause pressure inside of the spine, causing compression of the spinal cord with subsequent paralysis. This would require an emergency surgery to decompress, and there are no guarantees that the patient would recover from the paralysis. 4. Discitis-This is an infection of the intervertebral discs.  It occurs in about 1% of discography procedures.  It is difficult to treat and it may lead to surgery.        2. Pain: the needles have to go through skin and soft tissues, will cause soreness.       3. Damage to internal structures:  The nerves to be lesioned may be near blood vessels or    other nerves which can be potentially damaged.       4. Bleeding: Bleeding is more common if the patient is taking blood thinners such as  aspirin, Coumadin, Ticiid, Plavix, etc., or if he/she have some genetic predisposition  such as hemophilia. Bleeding into the spinal canal can cause compression of the spinal  cord with subsequent paralysis.  This would require an  emergency surgery to  decompress and there are no guarantees that the patient would recover from the  paralysis.       5. Pneumothorax:  Puncturing of a lung is a possibility, every time a needle is introduced in  the area of the chest or upper back.  Pneumothorax refers to free air around the  collapsed lung(s), inside of the thoracic cavity (chest cavity).  Another two possible  complications related to a similar event would include: Hemothorax and Chylothorax.   These are variations of the Pneumothorax, where instead of air around the collapsed  lung(s), you may have blood or chyle, respectively.       6. Spinal headaches: They may occur with any procedures in the area of the spine.       7. Persistent CSF (Cerebro-Spinal Fluid) leakage: This is a rare problem, but may occur  with prolonged intrathecal or epidural catheters either due to the formation of a fistulous  track or a dural tear.       8. Nerve damage: By working so close to the spinal cord, there is always a possibility of  nerve damage, which could be as serious as a permanent spinal cord injury with  paralysis.       9. Death:  Although rare, severe deadly allergic reactions known as "Anaphylactic  reaction" can occur to any of the medications used.      10. Worsening of the symptoms:  We can always make thing worse.    What are the chances of something like this happening? Chances of any of this occuring are extremely low.  By statistics, you have more of a chance of getting killed in a motor vehicle accident: while driving to the hospital than any of the above occurring .  Nevertheless, you should be aware that they are possibilities.  In general, it is similar to taking a shower.  Everybody knows that you can slip, hit your head and get killed.  Does that mean that you should not shower again?  Nevertheless always keep in mind that statistics do not mean anything if you happen to be on the wrong side of them.  Even if a procedure has a 1  (one) in a 1,000,000 (million) chance of going wrong, it you happen to be that one..Also, keep in mind that by statistics, you have more of a chance of having something go wrong when taking medications.  Who should not have this procedure? If you are on a blood thinning medication (e.g. Coumadin, Plavix, see list of "Blood Thinners"), or if you have an active infection going on, you should not have the procedure.  If you are taking any blood thinners, please inform your physician.  How should I prepare for this procedure?  Do not eat or drink anything at least six hours prior to the procedure.  Bring a driver with you .  It cannot be a taxi.  Come accompanied by an adult that can drive you back, and that is strong enough to help you if your legs get weak or numb from the local anesthetic.  Take all of your medicines the morning of the procedure with just enough water to swallow them.  If you have diabetes, make sure that you are scheduled to have your procedure done first thing in the morning, whenever possible.  If you have diabetes, take only half of your insulin dose and notify our nurse that you have done so as soon as you arrive at the clinic.  If you are diabetic, but only take blood sugar pills (oral hypoglycemic), then do not take them on the morning of your procedure.  You may take them after you have had the procedure.  Do not take aspirin or any aspirin-containing medications, at least eleven (11) days prior to the procedure.  They may prolong bleeding.  Wear loose fitting clothing that may be easy to take off and that you would not mind if it got stained with Betadine or blood.  Do not wear any jewelry or perfume  Remove any nail coloring.  It will interfere with some of our monitoring equipment.  NOTE: Remember that this is not meant to be interpreted as a complete list of all possible complications.  Unforeseen problems may occur.  BLOOD THINNERS The following drugs  contain aspirin or other products, which can cause increased bleeding during surgery and should not be taken for 2 weeks prior to and 1 week after surgery.  If you should need take something for relief of minor pain, you may take acetaminophen which is found in Tylenol,m Datril, Anacin-3 and Panadol. It is not blood thinner. The products listed below are.  Do not take any of the products listed below in addition to any listed on your instruction sheet.  A.P.C or A.P.C with Codeine Codeine Phosphate Capsules #3 Ibuprofen Ridaura  ABC compound Congesprin Imuran rimadil  Advil Cope Indocin Robaxisal  Alka-Seltzer Effervescent Pain Reliever and Antacid Coricidin or Coricidin-D  Indomethacin Rufen    Alka-Seltzer plus Cold Medicine Cosprin Ketoprofen S-A-C Tablets  Anacin Analgesic Tablets or Capsules Coumadin Korlgesic Salflex  Anacin Extra Strength Analgesic tablets or capsules CP-2 Tablets Lanoril Salicylate  Anaprox Cuprimine Capsules Levenox Salocol  Anexsia-D Dalteparin Magan Salsalate  Anodynos Darvon compound Magnesium Salicylate Sine-off  Ansaid Dasin Capsules Magsal Sodium Salicylate  Anturane Depen Capsules Marnal Soma  APF Arthritis pain formula Dewitt's Pills Measurin Stanback  Argesic Dia-Gesic Meclofenamic Sulfinpyrazone  Arthritis Bayer Timed Release Aspirin Diclofenac Meclomen Sulindac  Arthritis pain formula Anacin Dicumarol Medipren Supac  Analgesic (Safety coated) Arthralgen Diffunasal Mefanamic Suprofen  Arthritis Strength Bufferin Dihydrocodeine Mepro Compound Suprol  Arthropan liquid Dopirydamole Methcarbomol with Aspirin Synalgos  ASA tablets/Enseals Disalcid Micrainin Tagament  Ascriptin Doan's Midol Talwin  Ascriptin A/D Dolene Mobidin Tanderil  Ascriptin Extra Strength Dolobid Moblgesic Ticlid  Ascriptin with Codeine Doloprin or Doloprin with Codeine Momentum Tolectin  Asperbuf Duoprin Mono-gesic Trendar  Aspergum Duradyne Motrin or Motrin IB Triminicin  Aspirin  plain, buffered or enteric coated Durasal Myochrisine Trigesic  Aspirin Suppositories Easprin Nalfon Trillsate  Aspirin with Codeine Ecotrin Regular or Extra Strength Naprosyn Uracel  Atromid-S Efficin Naproxen Ursinus  Auranofin Capsules Elmiron Neocylate Vanquish  Axotal Emagrin Norgesic Verin  Azathioprine Empirin or Empirin with Codeine Normiflo Vitamin E  Azolid Emprazil Nuprin Voltaren  Bayer Aspirin plain, buffered or children's or timed BC Tablets or powders Encaprin Orgaran Warfarin Sodium  Buff-a-Comp Enoxaparin Orudis Zorpin  Buff-a-Comp with Codeine Equegesic Os-Cal-Gesic   Buffaprin Excedrin plain, buffered or Extra Strength Oxalid   Bufferin Arthritis Strength Feldene Oxphenbutazone   Bufferin plain or Extra Strength Feldene Capsules Oxycodone with Aspirin   Bufferin with Codeine Fenoprofen Fenoprofen Pabalate or Pabalate-SF   Buffets II Flogesic Panagesic   Buffinol plain or Extra Strength Florinal or Florinal with Codeine Panwarfarin   Buf-Tabs Flurbiprofen Penicillamine   Butalbital Compound Four-way cold tablets Penicillin   Butazolidin Fragmin Pepto-Bismol   Carbenicillin Geminisyn Percodan   Carna Arthritis Reliever Geopen Persantine   Carprofen Gold's salt Persistin   Chloramphenicol Goody's Phenylbutazone   Chloromycetin Haltrain Piroxlcam   Clmetidine heparin Plaquenil   Cllnoril Hyco-pap Ponstel   Clofibrate Hydroxy chloroquine Propoxyphen         Before stopping any of these medications, be sure to consult the physician who ordered them.  Some, such as Coumadin (Warfarin) are ordered to prevent or treat serious conditions such as "deep thrombosis", "pumonary embolisms", and other heart problems.  The amount of time that you may need off of the medication may also vary with the medication and the reason for which you were taking it.  If you are taking any of these medications, please make sure you notify your pain physician before you undergo any  procedures.         Epidural Steroid Injection Patient Information  Description: The epidural space surrounds the nerves as they exit the spinal cord.  In some patients, the nerves can be compressed and inflamed by a bulging disc or a tight spinal canal (spinal stenosis).  By injecting steroids into the epidural space, we can bring irritated nerves into direct contact with a potentially helpful medication.  These steroids act directly on the irritated nerves and can reduce swelling and inflammation which often leads to decreased pain.  Epidural steroids may be injected anywhere along the spine and from the neck to the low back depending upon the location of your pain.   After numbing the skin with local anesthetic (like Novocaine), a small needle is passed   into the epidural space slowly.  You may experience a sensation of pressure while this is being done.  The entire block usually last less than 10 minutes.  Conditions which may be treated by epidural steroids:   Low back and leg pain  Neck and arm pain  Spinal stenosis  Post-laminectomy syndrome  Herpes zoster (shingles) pain  Pain from compression fractures  Preparation for the injection:  1. Do not eat any solid food or dairy products within 8 hours of your appointment.  2. You may drink clear liquids up to 3 hours before appointment.  Clear liquids include water, black coffee, juice or soda.  No milk or cream please. 3. You may take your regular medication, including pain medications, with a sip of water before your appointment  Diabetics should hold regular insulin (if taken separately) and take 1/2 normal NPH dos the morning of the procedure.  Carry some sugar containing items with you to your appointment. 4. A driver must accompany you and be prepared to drive you home after your procedure.  5. Bring all your current medications with your. 6. An IV may be inserted and sedation may be given at the discretion of the  physician.   7. A blood pressure cuff, EKG and other monitors will often be applied during the procedure.  Some patients may need to have extra oxygen administered for a short period. 8. You will be asked to provide medical information, including your allergies, prior to the procedure.  We must know immediately if you are taking blood thinners (like Coumadin/Warfarin)  Or if you are allergic to IV iodine contrast (dye). We must know if you could possible be pregnant.  Possible side-effects:  Bleeding from needle site  Infection (rare, may require surgery)  Nerve injury (rare)  Numbness & tingling (temporary)  Difficulty urinating (rare, temporary)  Spinal headache ( a headache worse with upright posture)  Light -headedness (temporary)  Pain at injection site (several days)  Decreased blood pressure (temporary)  Weakness in arm/leg (temporary)  Pressure sensation in back/neck (temporary)  Call if you experience:  Fever/chills associated with headache or increased back/neck pain.  Headache worsened by an upright position.  New onset weakness or numbness of an extremity below the injection site  Hives or difficulty breathing (go to the emergency room)  Inflammation or drainage at the infection site  Severe back/neck pain  Any new symptoms which are concerning to you  Please note:  Although the local anesthetic injected can often make your back or neck feel good for several hours after the injection, the pain will likely return.  It takes 3-7 days for steroids to work in the epidural space.  You may not notice any pain relief for at least that one week.  If effective, we will often do a series of three injections spaced 3-6 weeks apart to maximally decrease your pain.  After the initial series, we generally will wait several months before considering a repeat injection of the same type.  If you have any questions, please call [phone] [facility] 

## 2016-11-24 ENCOUNTER — Other Ambulatory Visit: Payer: Self-pay | Admitting: Anesthesiology

## 2016-11-24 ENCOUNTER — Encounter: Payer: Self-pay | Admitting: Anesthesiology

## 2016-11-24 ENCOUNTER — Ambulatory Visit
Admission: RE | Admit: 2016-11-24 | Discharge: 2016-11-24 | Disposition: A | Discharge status: Home / Self Care | Payer: 59 | Source: Ambulatory Visit | Attending: Anesthesiology | Admitting: Anesthesiology

## 2016-11-24 ENCOUNTER — Ambulatory Visit (HOSPITAL_BASED_OUTPATIENT_CLINIC_OR_DEPARTMENT_OTHER): Payer: 59 | Admitting: Anesthesiology

## 2016-11-24 DIAGNOSIS — M5136 Other intervertebral disc degeneration, lumbar region: Secondary | ICD-10-CM | POA: Diagnosis not present

## 2016-11-24 DIAGNOSIS — M5432 Sciatica, left side: Secondary | ICD-10-CM | POA: Insufficient documentation

## 2016-11-24 DIAGNOSIS — Z8249 Family history of ischemic heart disease and other diseases of the circulatory system: Secondary | ICD-10-CM | POA: Insufficient documentation

## 2016-11-24 DIAGNOSIS — K219 Gastro-esophageal reflux disease without esophagitis: Secondary | ICD-10-CM | POA: Diagnosis not present

## 2016-11-24 DIAGNOSIS — R52 Pain, unspecified: Secondary | ICD-10-CM

## 2016-11-24 DIAGNOSIS — Z833 Family history of diabetes mellitus: Secondary | ICD-10-CM | POA: Diagnosis not present

## 2016-11-24 DIAGNOSIS — R51 Headache: Secondary | ICD-10-CM | POA: Insufficient documentation

## 2016-11-24 DIAGNOSIS — E785 Hyperlipidemia, unspecified: Secondary | ICD-10-CM | POA: Diagnosis not present

## 2016-11-24 DIAGNOSIS — E079 Disorder of thyroid, unspecified: Secondary | ICD-10-CM | POA: Insufficient documentation

## 2016-11-24 DIAGNOSIS — Z87891 Personal history of nicotine dependence: Secondary | ICD-10-CM | POA: Diagnosis not present

## 2016-11-24 DIAGNOSIS — Z955 Presence of coronary angioplasty implant and graft: Secondary | ICD-10-CM | POA: Insufficient documentation

## 2016-11-24 DIAGNOSIS — M5431 Sciatica, right side: Secondary | ICD-10-CM | POA: Diagnosis not present

## 2016-11-24 DIAGNOSIS — M5386 Other specified dorsopathies, lumbar region: Secondary | ICD-10-CM

## 2016-11-24 DIAGNOSIS — I2510 Atherosclerotic heart disease of native coronary artery without angina pectoris: Secondary | ICD-10-CM | POA: Diagnosis not present

## 2016-11-24 DIAGNOSIS — I10 Essential (primary) hypertension: Secondary | ICD-10-CM | POA: Diagnosis not present

## 2016-11-24 DIAGNOSIS — E559 Vitamin D deficiency, unspecified: Secondary | ICD-10-CM | POA: Diagnosis not present

## 2016-11-24 DIAGNOSIS — M539 Dorsopathy, unspecified: Secondary | ICD-10-CM

## 2016-11-24 DIAGNOSIS — Z9889 Other specified postprocedural states: Secondary | ICD-10-CM | POA: Diagnosis not present

## 2016-11-24 MED ORDER — IOPAMIDOL (ISOVUE-M 200) INJECTION 41%
20.0000 mL | Freq: Once | INTRAMUSCULAR | Status: DC | PRN
  Administered 2016-11-24: 10 mL
  Filled 2016-11-24: qty 20

## 2016-11-24 MED ORDER — TRIAMCINOLONE ACETONIDE 40 MG/ML IJ SUSP
40.0000 mg | Freq: Once | INTRAMUSCULAR | Status: AC
  Administered 2016-11-24: 40 mg
  Filled 2016-11-24: qty 1

## 2016-11-24 MED ORDER — LACTATED RINGERS IV SOLN: 1000.0000 mL | INTRAVENOUS | Status: DC

## 2016-11-24 MED ORDER — ROPIVACAINE HCL 5 MG/ML IJ SOLN
10.0000 mL | Freq: Once | INTRAMUSCULAR | Status: AC
  Administered 2016-11-24: 0.5 mL via EPIDURAL
  Filled 2016-11-24: qty 20

## 2016-11-24 MED ORDER — MIDAZOLAM HCL 5 MG/5ML IJ SOLN: 5.0000 mg | Freq: Once | INTRAMUSCULAR | Status: DC

## 2016-11-24 MED ORDER — SODIUM CHLORIDE 0.9 % IJ SOLN
INTRAMUSCULAR | Status: AC
  Filled 2016-11-24: qty 10

## 2016-11-24 MED ORDER — IOPAMIDOL (ISOVUE-M 200) INJECTION 41%
INTRAMUSCULAR | Status: AC
  Filled 2016-11-24: qty 10

## 2016-11-24 MED ORDER — SODIUM CHLORIDE 0.9% FLUSH
10.0000 mL | Freq: Once | INTRAVENOUS | Status: AC
Start: 2016-11-24 — End: 2016-11-24
  Administered 2016-11-24: 5 mL

## 2016-11-24 MED ORDER — LIDOCAINE HCL (PF) 1 % IJ SOLN
5.0000 mL | Freq: Once | INTRAMUSCULAR | Status: AC
  Administered 2016-11-24: 5 mL via SUBCUTANEOUS
  Filled 2016-11-24: qty 5

## 2016-11-24 NOTE — Progress Notes (Signed)
Safety precautions to be maintained throughout the outpatient stay will include: orient to surroundings, keep bed in low position, maintain call bell within reach at all times, provide assistance with transfer out of bed and ambulation.  

## 2016-11-24 NOTE — Progress Notes (Signed)
Subjective:  Patient ID: Christina Zamora, female    DOB: August 25, 1961  Age: 56 y.o. MRN: [id number]  CC: Back Pain  Procedure: None  Previous procedure: Previous series of lumbar epidural steroids in November and previously in spring 2017  HPI Christina Zamora presents for reevaluation. She did well with her previous epidural steroid injection. She would like to undergo a repeat series however has been on her Plavix. Otherwise she is in her usual state of health with same quality characteristic and distribution of pain. She's taking her Ultram twice a day and this generally helps. She has tried to do her back stretching strengthening exercises as well.  History Christina Zamora has a past medical history of Arthritis; CAD (coronary artery disease); Chronic headaches; GERD (gastroesophageal reflux disease); Hyperlipidemia; Hypertension; Insomnia; Lumbago; Thyroid disease; and Vitamin D deficiency.   She has a past surgical history that includes Coronary angioplasty with stent; Bilateral carpal tunnel release (2000); and Replacement total knee (Left, 2015).   Her family history includes Diabetes in her mother; Heart attack in her father; Heart failure in her mother; Hyperlipidemia in her mother.She reports that she has quit smoking. She has a 10.00 pack-year smoking history. She has never used smokeless tobacco. She reports that she does not drink alcohol or use drugs.   ---------------------------------------------------------------------------------------------------------------------- Past Medical History:  Diagnosis Date  . Arthritis   . CAD (coronary artery disease)   . Chronic headaches   . GERD (gastroesophageal reflux disease)   . Hyperlipidemia   . Hypertension   . Insomnia   . Lumbago   . Thyroid disease   . Vitamin D deficiency     Past Surgical History:  Procedure Laterality Date  . BILATERAL CARPAL TUNNEL RELEASE  2000  . CORONARY ANGIOPLASTY WITH STENT PLACEMENT    .  REPLACEMENT TOTAL KNEE Left 2015    Family History  Problem Relation Age of Onset  . Heart failure Mother   . Diabetes Mother   . Hyperlipidemia Mother   . Heart attack Father     Social History  Substance Use Topics  . Smoking status: Former Smoker    Packs/day: 0.50    Years: 20.00  . Smokeless tobacco: Never Used  . Alcohol use No    ---------------------------------------------------------------------------------------------------------------------- Social History   Social History  . Marital status: Married    Spouse name: N/A  . Number of children: N/A  . Years of education: N/A   Social History Main Topics  . Smoking status: Former Smoker    Packs/day: 0.50    Years: 20.00  . Smokeless tobacco: Never Used  . Alcohol use No  . Drug use: No  . Sexual activity: Not Asked   Other Topics Concern  . None   Social History Narrative  . None      ----------------------------------------------------------------------------------------------------------------------  ROS Review of Systems    Objective:  BP (!) 141/68 (BP Location: Left Arm, Patient Position: Sitting, Cuff Size: Large)   Pulse 83   Temp 98.2 F (36.8 C) (Oral)   Resp 16   Ht 5\' 3"  (1.6 m)   Wt 185 lb (83.9 kg)   SpO2 98%   BMI 32.77 kg/m   Physical Exam  Patient is alert oriented cooperative and a decent historian Heart is regular rate and rhythm without murmur Lungs are clear to auscultation Special the low back reveals some paraspinous muscle tenderness but no overt trigger points. Positive straight leg raise on the left side negative on the right  with good muscle tone and bulk  Assessment & Plan:   Christina Zamora was seen today for back pain.  Diagnoses and all orders for this visit:  DDD (degenerative disc disease), lumbar  Sciatica of left side  Sciatica of right side associated with disorder of lumbar spine  Other orders -     traMADol (ULTRAM) 50 MG tablet; Take 2 tablets  (100 mg total) by mouth 3 (three) times daily.     ----------------------------------------------------------------------------------------------------------------------  Problem List Items Addressed This Visit    None    Visit Diagnoses    DDD (degenerative disc disease), lumbar    -  Primary   Relevant Medications   traMADol (ULTRAM) 50 MG tablet   Sciatica of left side       Sciatica of right side associated with disorder of lumbar spine          ----------------------------------------------------------------------------------------------------------------------  1. DDD (degenerative disc disease), lumbar We will have her discontinue her Plavix 7 days prior to her next evaluation at which point we will proceed with her second epidural steroid injection. The benefits once again reviewed she appears to be responding favorably to the injections. A Foley this combination with continued physical therapy will get her back pain and leg pain under better control. 2. Sciatica associated with disorder of lumbar spine, left Same 3. Sciatica associated with disorder of lumbar spine, right  4. Facet arthritis of lumbosacral region Patient may be a candidate for facet injection pending her response to epidural steroid injection. Meantime on her to continue with her back stretching strengthening exercises as described her in detail today and ultimately she may be a candidate for further physical therapy intervention    ----------------------------------------------------------------------------------------------------------------------  I am having Ms. Mctee maintain her clopidogrel, atorvastatin, buPROPion, celecoxib, levothyroxine, ramipril, amLODipine, pantoprazole, traZODone, nitroGLYCERIN, aspirin, multivitamin, Vitamin D (Ergocalciferol), Cholecalciferol (VITAMIN D PO), acyclovir, buPROPion, enoxaparin, gabapentin, levothyroxine, amLODipine, aspirin EC, buPROPion, levothyroxine,  ramipril, levothyroxine, and traMADol. We will continue to administer sodium chloride flush, iohexol, iopamidol, lactated ringers, iopamidol, sodium chloride flush, and iopamidol.   Meds ordered this encounter  Medications  . levothyroxine (SYNTHROID, LEVOTHROID) 25 MCG tablet    Sig: Take 1 tablet by mouth once.  . traMADol (ULTRAM) 50 MG tablet    Sig: Take 2 tablets (100 mg total) by mouth 3 (three) times daily.    Dispense:  180 tablet    Refill:  2       Follow-up: Return in about 1 month (around 11/20/2016) for procedure.     Molli Barrows, MD

## 2016-11-24 NOTE — Progress Notes (Signed)
Subjective:  Patient ID: Christina Zamora, female    DOB: 1961/07/26  Age: 56 y.o. MRN: [id number]  CC: Back Pain (radiates to left leg)  Procedure: L5-S1 lumbar epidural steroid #2  Previous procedure L5-S1 lumbar epidural steroid November 2017 Previous procedure: Previous series of lumbar epidural steroids in spring 2017  HPI [name] presents for reevaluation. She was last seen back in November at which point she had a L5-S1 epidural steroid injection. She reports having gained 75-80% relief in her low back pain and leg pain that lasted approximately 2 months. She has had recurrence of the same pain and desires to proceed with a repeat injection today. The quality characteristic and distribution of the pain are as previously documented and she is denying any change in lower extremity strength or function at this time. She has been off her Plavix for 7 days and recently ran out of Ultram. She felt that the Ultram was helping her pain.  History Ambar has a past medical history of Arthritis; CAD (coronary artery disease); Chronic headaches; GERD (gastroesophageal reflux disease); Hyperlipidemia; Hypertension; Insomnia; Lumbago; Thyroid disease; and Vitamin D deficiency.   She has a past surgical history that includes Coronary angioplasty with stent; Bilateral carpal tunnel release (2000); and Replacement total knee (Left, 2015).   Her family history includes Diabetes in her mother; Heart attack in her father; Heart failure in her mother; Hyperlipidemia in her mother.She reports that she has quit smoking. She has a 10.00 pack-year smoking history. She has never used smokeless tobacco. She reports that she does not drink alcohol or use drugs.   ---------------------------------------------------------------------------------------------------------------------- Past Medical History:  Diagnosis Date  . Arthritis   . CAD (coronary artery disease)   . Chronic headaches   . GERD  (gastroesophageal reflux disease)   . Hyperlipidemia   . Hypertension   . Insomnia   . Lumbago   . Thyroid disease   . Vitamin D deficiency     Past Surgical History:  Procedure Laterality Date  . BILATERAL CARPAL TUNNEL RELEASE  2000  . CORONARY ANGIOPLASTY WITH STENT PLACEMENT    . REPLACEMENT TOTAL KNEE Left 2015    Family History  Problem Relation Age of Onset  . Heart failure Mother   . Diabetes Mother   . Hyperlipidemia Mother   . Heart attack Father     Social History  Substance Use Topics  . Smoking status: Former Smoker    Packs/day: 0.50    Years: 20.00  . Smokeless tobacco: Never Used  . Alcohol use No    ---------------------------------------------------------------------------------------------------------------------- Social History   Social History  . Marital status: Married    Spouse name: N/A  . Number of children: N/A  . Years of education: N/A   Social History Main Topics  . Smoking status: Former Smoker    Packs/day: 0.50    Years: 20.00  . Smokeless tobacco: Never Used  . Alcohol use No  . Drug use: No  . Sexual activity: Not Asked   Other Topics Concern  . None   Social History Narrative  . None      ----------------------------------------------------------------------------------------------------------------------  ROS Review of Systems  GI: No constipation Cardiac: No angina or shortness of breath   Objective:  BP (!) 166/99   Pulse 74   Temp 97.8 F (36.6 C) (Oral)   Resp 14   Ht 5\' 4"  (1.626 m)   Wt 188 lb (85.3 kg)   SpO2 98%   BMI 32.27 kg/m  Physical Exam  Patient is alert oriented cooperative and a decent historian Heart is regular rate and rhythm without murmur Lungs are clear to auscultation Inspection low back reveals some paraspinous muscle tenderness.Her lower extremity strength is at baseline with no change from previous augmentation.   Assessment & Plan:   Saidi was seen today for back  pain.  Diagnoses and all orders for this visit:  DDD (degenerative disc disease), lumbar -     LUMBAR EPIDURAL STEROID INJECTION -     LUMBAR EPIDURAL STEROID INJECTION  Sciatica of left side associated with disorder of lumbar spine -     LUMBAR EPIDURAL STEROID INJECTION -     LUMBAR EPIDURAL STEROID INJECTION -     Lumbar Epidural Injection; Future -     triamcinolone acetonide (KENALOG-40) injection 40 mg; 1 mL (40 mg total) by Other route once. -     sodium chloride flush (NS) 0.9 % injection 10 mL; 10 mLs by Other route once. -     ropivacaine (PF) 5 mg/mL (0.5%) (NAROPIN) injection 10 mL; 10 mLs by Epidural route once. -     midazolam (VERSED) 5 MG/5ML injection 5 mg; Inject 5 mLs (5 mg total) into the vein once. -     lidocaine (PF) (XYLOCAINE) 1 % injection 5 mL; Inject 5 mLs into the skin once. -     lactated ringers infusion 1,000 mL; Inject 1,000 mLs into the vein continuous. -     iopamidol (ISOVUE-M) 41 % intrathecal injection 20 mL; 20 mLs by Other route once as needed for contrast.  Sciatica of right side associated with disorder of lumbar spine -     LUMBAR EPIDURAL STEROID INJECTION -     LUMBAR EPIDURAL STEROID INJECTION     ----------------------------------------------------------------------------------------------------------------------  Problem List Items Addressed This Visit    None    Visit Diagnoses    DDD (degenerative disc disease), lumbar       Relevant Medications   triamcinolone acetonide (KENALOG-40) injection 40 mg (Completed)   Sciatica of left side associated with disorder of lumbar spine       Relevant Medications   triamcinolone acetonide (KENALOG-40) injection 40 mg (Completed)   sodium chloride flush (NS) 0.9 % injection 10 mL (Completed)   ropivacaine (PF) 5 mg/mL (0.5%) (NAROPIN) injection 10 mL (Completed)   midazolam (VERSED) 5 MG/5ML injection 5 mg   lidocaine (PF) (XYLOCAINE) 1 % injection 5 mL (Completed)   lactated ringers  infusion 1,000 mL   iopamidol (ISOVUE-M) 41 % intrathecal injection 20 mL   Other Relevant Orders   Lumbar Epidural Injection   Sciatica of right side associated with disorder of lumbar spine       Relevant Medications   midazolam (VERSED) 5 MG/5ML injection 5 mg      ----------------------------------------------------------------------------------------------------------------------  1. DDD (degenerative disc disease), lumbar She did well with her previous epidural back in November and desires to proceed with a repeat injection today. I think this is reasonable and we'll proceed with her second epidural at L5-S1. She is requesting a return to clinic in 3 months for reevaluation and possible repeat injection at that time. In the meantime she is to continue with Ultram 50 mg 1 tablet twice a day. Furthermore continue with stretching strengthening exercises as tolerated she can restart her Plavix tomorrow and we will defer on an MRI at this time  2. Sciatica associated with disorder of lumbar spine, left Same 3. Sciatica associated with disorder of lumbar  spine, right  She is to restart her Plavix tomorrow 4. Facet arthritis of lumbosacral region Patient may be a candidate for facet injection pending her response to epidural steroid injection. Meantime on her to continue with her back stretching strengthening exercises as described her in detail today and ultimately she may be a candidate for further physical therapy intervention    ----------------------------------------------------------------------------------------------------------------------  I am having Ms. Salmen maintain her clopidogrel, atorvastatin, buPROPion, celecoxib, levothyroxine, ramipril, amLODipine, pantoprazole, traZODone, nitroGLYCERIN, aspirin, multivitamin, Vitamin D (Ergocalciferol), Cholecalciferol (VITAMIN D PO), acyclovir, buPROPion, enoxaparin, gabapentin, levothyroxine, amLODipine, aspirin EC, buPROPion,  levothyroxine, ramipril, levothyroxine, and traMADol. We administered triamcinolone acetonide, sodium chloride flush, ropivacaine (PF) 5 mg/mL (0.5%), lidocaine (PF), and iopamidol. We will continue to administer sodium chloride flush, iohexol, iopamidol, lactated ringers, iopamidol, sodium chloride flush, and iopamidol.   Meds ordered this encounter  Medications  . triamcinolone acetonide (KENALOG-40) injection 40 mg  . sodium chloride flush (NS) 0.9 % injection 10 mL  . ropivacaine (PF) 5 mg/mL (0.5%) (NAROPIN) injection 10 mL  . midazolam (VERSED) 5 MG/5ML injection 5 mg  . lidocaine (PF) (XYLOCAINE) 1 % injection 5 mL  . lactated ringers infusion 1,000 mL  . iopamidol (ISOVUE-M) 41 % intrathecal injection 20 mL       Follow-up: Return in about 3 months (around 02/24/2017) for procedure.    Procedure: L5-S1 LESI#2 with fluoroscopic guidance without sedation NOTE: The risks, benefits, and expectations of the procedure have been discussed and explained to the patient who was understanding and in agreement with suggested treatment plan. No guarantees were made.  DESCRIPTION OF PROCEDURE: Lumbar epidural steroid injection with IV Versed, EKG, blood pressure, pulse, and pulse oximetry monitoring. The procedure was performed with the patient in the prone position under fluoroscopic guidance. A local anesthetic skin wheal of 1.5% plain lidocaine was performed at the appropriate site after fluoroscopic identifictation  Using strict aseptic technique, I then advanced an 18-gauge Tuohy epidural needle in the midline via loss-of-resistance to saline. There was negative aspiration for heme or  CSF.  I then confirmed position with both AP and Lateral fluoroscan. At L5-S1  A total of 5 mL of Preservative-Free normal saline with 40 mg of Kenalog and 1cc Ropicaine 0.2 percent was injected incrementally via the  epidurally placed needle. Needle removed. The patient tolerated the injection well and was  convalesced and discharged to home in stable condition. If the patient has any post procedure difficulty they have been instructed on how to contact us for assistance.   [email or web address]   Molli Barrows, MD

## 2016-11-24 NOTE — Patient Instructions (Signed)
Pain Management Discharge Instructions  General Discharge Instructions :  If you need to reach your doctor call: Monday-Friday 8:00 am - 4:00 pm at [phone] or toll free [phone].  After clinic hours [phone] to have operator reach doctor.  Bring all of your medication bottles to all your appointments in the pain clinic.  To cancel or reschedule your appointment with Pain Management please remember to call 24 hours in advance to avoid a fee.  Refer to the educational materials which you have been given on: General Risks, I had my Procedure. Discharge Instructions, Post Sedation.  Post Procedure Instructions:  The drugs you were given will stay in your system until tomorrow, so for the next 24 hours you should not drive, make any legal decisions or drink any alcoholic beverages.  You may eat anything you prefer, but it is better to start with liquids then soups and crackers, and gradually work up to solid foods.  Please notify your doctor immediately if you have any unusual bleeding, trouble breathing or pain that is not related to your normal pain.  Depending on the type of procedure that was done, some parts of your body may feel week and/or numb.  This usually clears up by tonight or the next day.  Walk with the use of an assistive device or accompanied by an adult for the 24 hours.  You may use ice on the affected area for the first 24 hours.  Put ice in a Ziploc bag and cover with a towel and place against area 15 minutes on 15 minutes off.  You may switch to heat after 24 hours.Epidural Steroid Injection Patient Information  Description: The epidural space surrounds the nerves as they exit the spinal cord.  In some patients, the nerves can be compressed and inflamed by a bulging disc or a tight spinal canal (spinal stenosis).  By injecting steroids into the epidural space, we can bring irritated nerves into direct contact with a potentially helpful medication.  These  steroids act directly on the irritated nerves and can reduce swelling and inflammation which often leads to decreased pain.  Epidural steroids may be injected anywhere along the spine and from the neck to the low back depending upon the location of your pain.   After numbing the skin with local anesthetic (like Novocaine), a small needle is passed into the epidural space slowly.  You may experience a sensation of pressure while this is being done.  The entire block usually last less than 10 minutes.  Conditions which may be treated by epidural steroids:   Low back and leg pain  Neck and arm pain  Spinal stenosis  Post-laminectomy syndrome  Herpes zoster (shingles) pain  Pain from compression fractures  Preparation for the injection:  1. Do not eat any solid food or dairy products within 8 hours of your appointment.  2. You may drink clear liquids up to 3 hours before appointment.  Clear liquids include water, black coffee, juice or soda.  No milk or cream please. 3. You may take your regular medication, including pain medications, with a sip of water before your appointment  Diabetics should hold regular insulin (if taken separately) and take 1/2 normal NPH dos the morning of the procedure.  Carry some sugar containing items with you to your appointment. 4. A driver must accompany you and be prepared to drive you home after your procedure.  5. Bring all your current medications with your. 6. An IV may be inserted and   sedation may be given at the discretion of the physician.   7. A blood pressure cuff, EKG and other monitors will often be applied during the procedure.  Some patients may need to have extra oxygen administered for a short period. 8. You will be asked to provide medical information, including your allergies, prior to the procedure.  We must know immediately if you are taking blood thinners (like Coumadin/Warfarin)  Or if you are allergic to IV iodine contrast (dye). We must  know if you could possible be pregnant.  Possible side-effects:  Bleeding from needle site  Infection (rare, may require surgery)  Nerve injury (rare)  Numbness & tingling (temporary)  Difficulty urinating (rare, temporary)  Spinal headache ( a headache worse with upright posture)  Light -headedness (temporary)  Pain at injection site (several days)  Decreased blood pressure (temporary)  Weakness in arm/leg (temporary)  Pressure sensation in back/neck (temporary)  Call if you experience:  Fever/chills associated with headache or increased back/neck pain.  Headache worsened by an upright position.  New onset weakness or numbness of an extremity below the injection site  Hives or difficulty breathing (go to the emergency room)  Inflammation or drainage at the infection site  Severe back/neck pain  Any new symptoms which are concerning to you  Please note:  Although the local anesthetic injected can often make your back or neck feel good for several hours after the injection, the pain will likely return.  It takes 3-7 days for steroids to work in the epidural space.  You may not notice any pain relief for at least that one week.  If effective, we will often do a series of three injections spaced 3-6 weeks apart to maximally decrease your pain.  After the initial series, we generally will wait several months before considering a repeat injection of the same type.  If you have any questions, please call [phone] [facility] 

## 2016-11-25 ENCOUNTER — Telehealth: Payer: Self-pay | Admitting: *Deleted

## 2016-11-25 NOTE — Telephone Encounter (Signed)
Left voicemail for patient to call for any post procedure problems.

## 2017-02-11 ENCOUNTER — Ambulatory Visit: Payer: 59 | Admitting: Anesthesiology

## 2017-03-20 ENCOUNTER — Ambulatory Visit: Payer: 59 | Admitting: Anesthesiology

## 2017-04-01 ENCOUNTER — Other Ambulatory Visit: Payer: Self-pay | Admitting: Anesthesiology

## 2017-05-07 ENCOUNTER — Other Ambulatory Visit: Payer: Self-pay | Admitting: Anesthesiology

## 2017-05-07 ENCOUNTER — Ambulatory Visit (HOSPITAL_BASED_OUTPATIENT_CLINIC_OR_DEPARTMENT_OTHER): Payer: 59 | Admitting: Anesthesiology

## 2017-05-07 ENCOUNTER — Ambulatory Visit
Admission: RE | Admit: 2017-05-07 | Discharge: 2017-05-07 | Disposition: A | Discharge status: Home / Self Care | Payer: 59 | Source: Ambulatory Visit | Attending: Anesthesiology | Admitting: Anesthesiology

## 2017-05-07 ENCOUNTER — Encounter: Payer: Self-pay | Admitting: Anesthesiology

## 2017-05-07 VITALS — BP 128/66 | HR 68 | Temp 97.9°F | Resp 16 | Ht 62.0 in | Wt 185.0 lb

## 2017-05-07 DIAGNOSIS — E079 Disorder of thyroid, unspecified: Secondary | ICD-10-CM | POA: Diagnosis not present

## 2017-05-07 DIAGNOSIS — K219 Gastro-esophageal reflux disease without esophagitis: Secondary | ICD-10-CM | POA: Diagnosis not present

## 2017-05-07 DIAGNOSIS — I272 Pulmonary hypertension, unspecified: Secondary | ICD-10-CM | POA: Diagnosis not present

## 2017-05-07 DIAGNOSIS — M4697 Unspecified inflammatory spondylopathy, lumbosacral region: Secondary | ICD-10-CM

## 2017-05-07 DIAGNOSIS — Z87891 Personal history of nicotine dependence: Secondary | ICD-10-CM | POA: Diagnosis not present

## 2017-05-07 DIAGNOSIS — Z96652 Presence of left artificial knee joint: Secondary | ICD-10-CM | POA: Insufficient documentation

## 2017-05-07 DIAGNOSIS — Z833 Family history of diabetes mellitus: Secondary | ICD-10-CM | POA: Diagnosis not present

## 2017-05-07 DIAGNOSIS — M47817 Spondylosis without myelopathy or radiculopathy, lumbosacral region: Secondary | ICD-10-CM

## 2017-05-07 DIAGNOSIS — I10 Essential (primary) hypertension: Secondary | ICD-10-CM | POA: Diagnosis not present

## 2017-05-07 DIAGNOSIS — Z79899 Other long term (current) drug therapy: Secondary | ICD-10-CM | POA: Diagnosis not present

## 2017-05-07 DIAGNOSIS — Z7982 Long term (current) use of aspirin: Secondary | ICD-10-CM | POA: Diagnosis not present

## 2017-05-07 DIAGNOSIS — E785 Hyperlipidemia, unspecified: Secondary | ICD-10-CM | POA: Insufficient documentation

## 2017-05-07 DIAGNOSIS — I2510 Atherosclerotic heart disease of native coronary artery without angina pectoris: Secondary | ICD-10-CM | POA: Insufficient documentation

## 2017-05-07 DIAGNOSIS — E559 Vitamin D deficiency, unspecified: Secondary | ICD-10-CM | POA: Insufficient documentation

## 2017-05-07 DIAGNOSIS — M5136 Other intervertebral disc degeneration, lumbar region: Secondary | ICD-10-CM | POA: Diagnosis not present

## 2017-05-07 DIAGNOSIS — Z955 Presence of coronary angioplasty implant and graft: Secondary | ICD-10-CM | POA: Diagnosis not present

## 2017-05-07 DIAGNOSIS — M5116 Intervertebral disc disorders with radiculopathy, lumbar region: Secondary | ICD-10-CM | POA: Diagnosis not present

## 2017-05-07 DIAGNOSIS — M5386 Other specified dorsopathies, lumbar region: Secondary | ICD-10-CM

## 2017-05-07 DIAGNOSIS — M539 Dorsopathy, unspecified: Secondary | ICD-10-CM

## 2017-05-07 DIAGNOSIS — R52 Pain, unspecified: Secondary | ICD-10-CM

## 2017-05-07 DIAGNOSIS — M545 Low back pain: Secondary | ICD-10-CM | POA: Diagnosis present

## 2017-05-07 DIAGNOSIS — Z8249 Family history of ischemic heart disease and other diseases of the circulatory system: Secondary | ICD-10-CM | POA: Diagnosis not present

## 2017-05-07 MED ORDER — TRAMADOL HCL 50 MG PO TABS: 100.0000 mg | ORAL_TABLET | Freq: Four times a day (QID) | ORAL | 2 refills | Status: DC | PRN

## 2017-05-07 MED ORDER — ROPIVACAINE HCL 2 MG/ML IJ SOLN
10.0000 mL | Freq: Once | INTRAMUSCULAR | Status: AC
  Administered 2017-05-07: 10 mL via EPIDURAL
  Filled 2017-05-07: qty 10

## 2017-05-07 MED ORDER — SODIUM CHLORIDE 0.9 % IJ SOLN
INTRAMUSCULAR | Status: AC
  Filled 2017-05-07: qty 20

## 2017-05-07 MED ORDER — LIDOCAINE HCL (PF) 1 % IJ SOLN
5.0000 mL | Freq: Once | INTRAMUSCULAR | Status: AC
  Administered 2017-05-07: 5 mL via SUBCUTANEOUS
  Filled 2017-05-07: qty 5

## 2017-05-07 MED ORDER — TRIAMCINOLONE ACETONIDE 40 MG/ML IJ SUSP
40.0000 mg | Freq: Once | INTRAMUSCULAR | Status: AC
  Administered 2017-05-07: 40 mg
  Filled 2017-05-07: qty 1

## 2017-05-07 MED ORDER — IOPAMIDOL (ISOVUE-M 200) INJECTION 41%
20.0000 mL | Freq: Once | INTRAMUSCULAR | Status: DC | PRN
  Administered 2017-05-07: 10 mL
  Filled 2017-05-07: qty 20

## 2017-05-07 MED ORDER — SODIUM CHLORIDE 0.9% FLUSH
10.0000 mL | Freq: Once | INTRAVENOUS | Status: AC
  Administered 2017-05-07: 10 mL

## 2017-05-07 MED ORDER — LACTATED RINGERS IV SOLN: 1000.0000 mL | INTRAVENOUS | Status: DC

## 2017-05-07 MED ORDER — IOPAMIDOL (ISOVUE-M 200) INJECTION 41%
INTRAMUSCULAR | Status: AC
  Filled 2017-05-07: qty 10

## 2017-05-07 MED ORDER — TRAMADOL HCL 50 MG PO TABS: 50.0000 mg | ORAL_TABLET | Freq: Four times a day (QID) | ORAL | 2 refills | Status: DC | PRN

## 2017-05-07 NOTE — Progress Notes (Signed)
Nursing Pain Medication Assessment:  Safety precautions to be maintained throughout the outpatient stay will include: orient to surroundings, keep bed in low position, maintain call bell within reach at all times, provide assistance with transfer out of bed and ambulation.  Medication Inspection Compliance: Ms. Todaro did not comply with our request to bring her pills to be counted. She was reminded that bringing the medication bottles, even when empty, is a requirement.  Medication: None brought in. Pill/Patch Count: None available to be counted. Bottle Appearance: No container available. Did not bring bottle(s) to appointment. Filled Date: N/A Last Medication intake:  Today  Reminded to bring  pill bottles to all appointments for count.

## 2017-05-08 ENCOUNTER — Telehealth: Payer: Self-pay

## 2017-05-08 NOTE — Telephone Encounter (Signed)
Post procedure phone call.  Left message.  

## 2017-05-12 NOTE — Progress Notes (Signed)
Subjective:  Patient ID: Christina Zamora, female    DOB: 1961/05/17  Age: 56 y.o. MRN: [id number]  CC: Back Pain (low left)  Procedure: L4 L5 lumbar epidural steroid No. 3  Previous procedure L5-S1 lumbar epidural steroid March 2018 and November 2017 Previous procedure: Previous series of lumbar epidural steroids in spring 2017  HPI Christina Zamora presents for reevaluation today. She was last seen in March and had her second epidural steroid at that time. She's had improvement in her lower extremity pain with a 75% reduction in the gnawing burning pain radiating into her legs and diminished calf cramping. She still having some low back pain but this also has improved. She continues to take Ultram 3 times a day and occasionally 4 times a day and this seems to help with her pain as well. Otherwise she is in her usual state of health with no new changes in lower extremity strength or function or bowel or bladder function.   History Christina Zamora has a past medical history of Arthritis; CAD (coronary artery disease); Chronic headaches; GERD (gastroesophageal reflux disease); Hyperlipidemia; Hypertension; Insomnia; Lumbago; Pulmonary HTN ([place]); Thyroid disease; and Vitamin D deficiency.   She has a past surgical history that includes Coronary angioplasty with stent; Bilateral carpal tunnel release (2000); and Replacement total knee (Left, 2015).   Her family history includes Diabetes in her mother; Heart attack in her father; Heart failure in her mother; Hyperlipidemia in her mother.She reports that she has quit smoking. She has a 10.00 pack-year smoking history. She has never used smokeless tobacco. She reports that she does not drink alcohol or use drugs.   ---------------------------------------------------------------------------------------------------------------------- Past Medical History:  Diagnosis Date  . Arthritis   . CAD (coronary artery disease)   . Chronic headaches   . GERD  (gastroesophageal reflux disease)   . Hyperlipidemia   . Hypertension   . Insomnia   . Lumbago   . Pulmonary HTN ([place])   . Thyroid disease   . Vitamin D deficiency     Past Surgical History:  Procedure Laterality Date  . BILATERAL CARPAL TUNNEL RELEASE  2000  . CORONARY ANGIOPLASTY WITH STENT PLACEMENT    . REPLACEMENT TOTAL KNEE Left 2015    Family History  Problem Relation Age of Onset  . Heart failure Mother   . Diabetes Mother   . Hyperlipidemia Mother   . Heart attack Father     Social History  Substance Use Topics  . Smoking status: Former Smoker    Packs/day: 0.50    Years: 20.00  . Smokeless tobacco: Never Used  . Alcohol use No    ---------------------------------------------------------------------------------------------------------------------- Social History   Social History  . Marital status: Married    Spouse name: N/A  . Number of children: N/A  . Years of education: N/A   Social History Main Topics  . Smoking status: Former Smoker    Packs/day: 0.50    Years: 20.00  . Smokeless tobacco: Never Used  . Alcohol use No  . Drug use: No  . Sexual activity: Not Asked   Other Topics Concern  . None   Social History Narrative  . None      ----------------------------------------------------------------------------------------------------------------------  ROS Review of Systems  GI: No constipation or diarrhea Cardiac: No angina or shortness of breath  Objective:  BP 128/66   Pulse 68   Temp 97.9 F (36.6 C)   Resp 16   Ht 5\' 2"  (1.575 m)   Wt 185 lb (83.9  kg)   SpO2 100%   BMI 33.84 kg/m   Physical Exam  Patient is alert oriented cooperative compliant Heart is regular rate and rhythm without murmur Lungs are clear to auscultation Lower extremity strength and function are at baseline    Assessment & Plan:   Christina Zamora was seen today for back pain.  Diagnoses and all orders for this visit:  DDD (degenerative disc  disease), lumbar -     triamcinolone acetonide (KENALOG-40) injection 40 mg; 1 mL (40 mg total) by Other route once. -     sodium chloride flush (NS) 0.9 % injection 10 mL; 10 mLs by Other route once. -     ropivacaine (PF) 2 mg/mL (0.2%) (NAROPIN) injection 10 mL; 10 mLs by Epidural route once. -     lidocaine (PF) (XYLOCAINE) 1 % injection 5 mL; Inject 5 mLs into the skin once. -     lactated ringers infusion 1,000 mL; Inject 1,000 mLs into the vein continuous. -     iopamidol (ISOVUE-M) 41 % intrathecal injection 20 mL; 20 mLs by Other route once as needed for contrast.  Sciatica of left side associated with disorder of lumbar spine -     Lumbar Epidural Injection -     triamcinolone acetonide (KENALOG-40) injection 40 mg; 1 mL (40 mg total) by Other route once. -     sodium chloride flush (NS) 0.9 % injection 10 mL; 10 mLs by Other route once. -     ropivacaine (PF) 2 mg/mL (0.2%) (NAROPIN) injection 10 mL; 10 mLs by Epidural route once. -     lidocaine (PF) (XYLOCAINE) 1 % injection 5 mL; Inject 5 mLs into the skin once. -     lactated ringers infusion 1,000 mL; Inject 1,000 mLs into the vein continuous. -     iopamidol (ISOVUE-M) 41 % intrathecal injection 20 mL; 20 mLs by Other route once as needed for contrast. -     Lumbar Epidural Injection; Future  Facet arthritis of lumbosacral region ([place])  Other orders -     Discontinue: traMADol (ULTRAM) 50 MG tablet; Take 1 tablet (50 mg total) by mouth every 6 (six) hours as needed for moderate pain. (Patient not taking: Reported on 05/07/2017) -     Discontinue: traMADol (ULTRAM) 50 MG tablet; Take 2 tablets (100 mg total) by mouth every 6 (six) hours as needed for moderate pain. -     traMADol (ULTRAM) 50 MG tablet; Take 2 tablets (100 mg total) by mouth every 6 (six) hours as needed for moderate pain.     ----------------------------------------------------------------------------------------------------------------------  Problem  List Items Addressed This Visit    None    Visit Diagnoses    DDD (degenerative disc disease), lumbar    -  Primary   Relevant Medications   triamcinolone acetonide (KENALOG-40) injection 40 mg (Completed)   sodium chloride flush (NS) 0.9 % injection 10 mL (Completed)   ropivacaine (PF) 2 mg/mL (0.2%) (NAROPIN) injection 10 mL (Completed)   lidocaine (PF) (XYLOCAINE) 1 % injection 5 mL (Completed)   lactated ringers infusion 1,000 mL   iopamidol (ISOVUE-M) 41 % intrathecal injection 20 mL   traMADol (ULTRAM) 50 MG tablet   Sciatica of left side associated with disorder of lumbar spine       Relevant Medications   triamcinolone acetonide (KENALOG-40) injection 40 mg (Completed)   sodium chloride flush (NS) 0.9 % injection 10 mL (Completed)   ropivacaine (PF) 2 mg/mL (0.2%) (NAROPIN) injection 10 mL (Completed)  lidocaine (PF) (XYLOCAINE) 1 % injection 5 mL (Completed)   lactated ringers infusion 1,000 mL   iopamidol (ISOVUE-M) 41 % intrathecal injection 20 mL   Other Relevant Orders   Lumbar Epidural Injection   Facet arthritis of lumbosacral region ([facility])       Relevant Medications   triamcinolone acetonide (KENALOG-40) injection 40 mg (Completed)   traMADol (ULTRAM) 50 MG tablet      ----------------------------------------------------------------------------------------------------------------------  1. DDD (degenerative disc disease), lumbar We will proceed with her third epidural steroid injection today. The risks and benefits once again reviewed and all questions answered. We'll have her continue with her current medication regimen as well with Ultram 1 tablet up to 4 times a day with 3 refills given. By her continue with back stretching strengthening exercises and she may restart her Plavix tomorrow with return to clinic in 3 months for reevaluation at that time. She is instructed to contact us should she have any troubles with the injection. 2. Sciatica associated with  disorder of lumbar spine, left Same 3. Sciatica associated with disorder of lumbar spine, right 4. Facet arthritis of lumbosacral region Patient may be a candidate for facet injection pending her response to epidural steroid injection. Meantime on her to continue with her back stretching strengthening exercises as described her in detail today and ultimately she may be a candidate for further physical therapy intervention    ----------------------------------------------------------------------------------------------------------------------  I am having Christina Zamora maintain her clopidogrel, atorvastatin, buPROPion, celecoxib, levothyroxine, ramipril, amLODipine, pantoprazole, traZODone, nitroGLYCERIN, aspirin, multivitamin, Vitamin D (Ergocalciferol), Cholecalciferol (VITAMIN D PO), acyclovir, buPROPion, enoxaparin, gabapentin, levothyroxine, amLODipine, aspirin EC, buPROPion, levothyroxine, ramipril, levothyroxine, traMADol, ezetimibe, hydrochlorothiazide, Cholecalciferol (VITAMIN D PO), and traMADol. We administered triamcinolone acetonide, sodium chloride flush, ropivacaine (PF) 2 mg/mL (0.2%), lidocaine (PF), and iopamidol. We will continue to administer sodium chloride flush, iohexol, iopamidol, lactated ringers, iopamidol, sodium chloride flush, and iopamidol.   Meds ordered this encounter  Medications  . ezetimibe (ZETIA) 10 MG tablet    Sig: Take 10 mg by mouth daily.  . hydrochlorothiazide (HYDRODIURIL) 25 MG tablet    Sig: Take 25 mg by mouth daily.  . Cholecalciferol (VITAMIN D PO)    Sig: Take 5,000 Units by mouth daily.  Marland Kitchen triamcinolone acetonide (KENALOG-40) injection 40 mg  . sodium chloride flush (NS) 0.9 % injection 10 mL  . ropivacaine (PF) 2 mg/mL (0.2%) (NAROPIN) injection 10 mL  . lidocaine (PF) (XYLOCAINE) 1 % injection 5 mL  . lactated ringers infusion 1,000 mL  . iopamidol (ISOVUE-M) 41 % intrathecal injection 20 mL  . DISCONTD: traMADol (ULTRAM) 50 MG tablet     Sig: Take 1 tablet (50 mg total) by mouth every 6 (six) hours as needed for moderate pain.    Dispense:  120 tablet    Refill:  2  . DISCONTD: traMADol (ULTRAM) 50 MG tablet    Sig: Take 2 tablets (100 mg total) by mouth every 6 (six) hours as needed for moderate pain.    Dispense:  180 tablet    Refill:  2  . traMADol (ULTRAM) 50 MG tablet    Sig: Take 2 tablets (100 mg total) by mouth every 6 (six) hours as needed for moderate pain.    Dispense:  240 tablet    Refill:  2       Follow-up: Return in about 3 months (around 08/07/2017) for procedure.    Procedure: L4 L5 epidural steroid No. 3 with fluoroscopic guidance without sedation NOTE: The risks, benefits,  and expectations of the procedure have been discussed and explained to the patient who was understanding and in agreement with suggested treatment plan. No guarantees were made.  DESCRIPTION OF PROCEDURE: Lumbar epidural steroid injection with no IV Versed, EKG, blood pressure, pulse, and pulse oximetry monitoring. The procedure was performed with the patient in the prone position under fluoroscopic guidance. A local anesthetic skin wheal of 1.5% plain lidocaine was performed at the appropriate site after fluoroscopic identifictation  Using strict aseptic technique, I then advanced an 18-gauge Tuohy epidural needle in the midline via loss-of-resistance to saline. There was negative aspiration for heme or  CSF.  I then confirmed position with both AP and Lateral fluoroscan. At L5-S1  A total of 5 mL of Preservative-Free normal saline with 40 mg of Kenalog and 1cc Ropicaine 0.2 percent was injected incrementally via the  epidurally placed needle. Needle removed. The patient tolerated the injection well and was convalesced and discharged to home in stable condition. If the patient has any post procedure difficulty they have been instructed on how to contact us for assistance.   [email or web address]   Molli Barrows, MD

## 2017-05-20 ENCOUNTER — Other Ambulatory Visit: Payer: Self-pay | Admitting: Cardiovascular Disease

## 2017-05-21 ENCOUNTER — Ambulatory Visit: Admission: RE | Admit: 2017-05-21 | Payer: 59 | Source: Ambulatory Visit | Admitting: Cardiovascular Disease

## 2017-05-21 ENCOUNTER — Encounter: Admission: RE | Payer: Self-pay | Source: Ambulatory Visit

## 2017-05-21 SURGERY — LEFT HEART CATH AND CORONARY ANGIOGRAPHY
Anesthesia: Moderate Sedation

## 2017-05-29 ENCOUNTER — Other Ambulatory Visit: Payer: Self-pay | Admitting: Cardiovascular Disease

## 2017-05-29 ENCOUNTER — Observation Stay
Admission: AD | Admit: 2017-05-29 | Discharge: 2017-05-29 | Disposition: A | Discharge status: Home / Self Care | Payer: 59 | Source: Ambulatory Visit | Attending: Cardiovascular Disease | Admitting: Cardiovascular Disease

## 2017-05-29 ENCOUNTER — Encounter
Admission: AD | Disposition: A | Discharge status: Home / Self Care | Payer: Self-pay | Source: Ambulatory Visit | Attending: Cardiovascular Disease

## 2017-05-29 DIAGNOSIS — I200 Unstable angina: Secondary | ICD-10-CM | POA: Diagnosis present

## 2017-05-29 DIAGNOSIS — I2510 Atherosclerotic heart disease of native coronary artery without angina pectoris: Secondary | ICD-10-CM | POA: Diagnosis present

## 2017-05-29 DIAGNOSIS — I25110 Atherosclerotic heart disease of native coronary artery with unstable angina pectoris: Principal | ICD-10-CM | POA: Insufficient documentation

## 2017-05-29 HISTORY — PX: CORONARY ANGIOGRAPHY: CATH118303

## 2017-05-29 HISTORY — PX: LEFT HEART CATH: CATH118248

## 2017-05-29 SURGERY — LEFT HEART CATH
Anesthesia: Moderate Sedation

## 2017-05-29 SURGERY — LEFT HEART CATH AND CORONARY ANGIOGRAPHY
Anesthesia: Moderate Sedation | Laterality: Right

## 2017-05-29 MED ORDER — HEPARIN (PORCINE) IN NACL 2-0.9 UNIT/ML-% IJ SOLN
INTRAMUSCULAR | Status: AC
  Filled 2017-05-29: qty 1000

## 2017-05-29 MED ORDER — SODIUM CHLORIDE 0.9 % WEIGHT BASED INFUSION: 1.0000 mL/kg/h | INTRAVENOUS | Status: DC

## 2017-05-29 MED ORDER — SODIUM CHLORIDE 0.9 % IV SOLN: 250.0000 mL | INTRAVENOUS | Status: DC | PRN

## 2017-05-29 MED ORDER — TICAGRELOR 90 MG PO TABS
ORAL_TABLET | ORAL | Status: AC
  Filled 2017-05-29: qty 2

## 2017-05-29 MED ORDER — SODIUM CHLORIDE 0.9% FLUSH: 3.0000 mL | INTRAVENOUS | Status: DC | PRN

## 2017-05-29 MED ORDER — ASPIRIN 81 MG PO CHEW
CHEWABLE_TABLET | ORAL | Status: AC
  Filled 2017-05-29: qty 3

## 2017-05-29 MED ORDER — LIDOCAINE HCL (PF) 1 % IJ SOLN
INTRAMUSCULAR | Status: AC
  Filled 2017-05-29: qty 30

## 2017-05-29 MED ORDER — ONDANSETRON HCL 4 MG/2ML IJ SOLN: 4.0000 mg | Freq: Four times a day (QID) | INTRAMUSCULAR | Status: DC | PRN

## 2017-05-29 MED ORDER — SODIUM CHLORIDE 0.9% FLUSH: 3.0000 mL | Freq: Two times a day (BID) | INTRAVENOUS | Status: DC

## 2017-05-29 MED ORDER — MIDAZOLAM HCL 2 MG/2ML IJ SOLN
INTRAMUSCULAR | Status: AC
  Filled 2017-05-29: qty 2

## 2017-05-29 MED ORDER — BIVALIRUDIN TRIFLUOROACETATE 250 MG IV SOLR
INTRAVENOUS | Status: AC
  Filled 2017-05-29: qty 250

## 2017-05-29 MED ORDER — NITROGLYCERIN 5 MG/ML IV SOLN
INTRAVENOUS | Status: AC
  Filled 2017-05-29: qty 10

## 2017-05-29 MED ORDER — SODIUM CHLORIDE 0.9 % WEIGHT BASED INFUSION
3.0000 mL/kg/h | INTRAVENOUS | Status: DC
  Administered 2017-05-29: 3 mL/kg/h via INTRAVENOUS

## 2017-05-29 MED ORDER — FENTANYL CITRATE (PF) 100 MCG/2ML IJ SOLN
INTRAMUSCULAR | Status: AC
  Filled 2017-05-29: qty 2

## 2017-05-29 MED ORDER — MIDAZOLAM HCL 2 MG/2ML IJ SOLN
INTRAMUSCULAR | Status: DC | PRN
  Administered 2017-05-29: 1 mg via INTRAVENOUS

## 2017-05-29 MED ORDER — ASPIRIN 81 MG PO CHEW
81.0000 mg | CHEWABLE_TABLET | ORAL | Status: AC
  Administered 2017-05-29: 81 mg via ORAL

## 2017-05-29 MED ORDER — ASPIRIN 81 MG PO CHEW
CHEWABLE_TABLET | ORAL | Status: DC
Start: 2017-05-29 — End: 2017-05-29
  Filled 2017-05-29: qty 1

## 2017-05-29 MED ORDER — LIDOCAINE HCL (PF) 1 % IJ SOLN
INTRAMUSCULAR | Status: DC | PRN
  Administered 2017-05-29: 15 mL via INTRADERMAL

## 2017-05-29 MED ORDER — ACETAMINOPHEN 325 MG PO TABS: 650.0000 mg | ORAL_TABLET | ORAL | Status: DC | PRN

## 2017-05-29 MED ORDER — IOPAMIDOL (ISOVUE-300) INJECTION 61%
INTRAVENOUS | Status: DC | PRN
  Administered 2017-05-29: 170 mL via INTRA_ARTERIAL

## 2017-05-29 MED ORDER — FENTANYL CITRATE (PF) 100 MCG/2ML IJ SOLN
INTRAMUSCULAR | Status: DC | PRN
  Administered 2017-05-29: 25 ug via INTRAVENOUS

## 2017-05-29 SURGICAL SUPPLY — 11 items
CATH INFINITI 5FR ANG PIGTAIL (CATHETERS) ×2 IMPLANT
CATH INFINITI 5FR JL4 (CATHETERS) ×2 IMPLANT
CATH INFINITI JR4 5F (CATHETERS) ×2 IMPLANT
DEVICE CLOSURE MYNXGRIP 5F (Vascular Products) ×2 IMPLANT
GUIDEWIRE 3MM J TIP .035 145 (WIRE) ×4 IMPLANT
KIT MANI 3VAL PERCEP (MISCELLANEOUS) ×4 IMPLANT
NDL PERC 18GX7CM (NEEDLE) IMPLANT
NEEDLE PERC 18GX7CM (NEEDLE) ×4 IMPLANT
PACK CARDIAC CATH (CUSTOM PROCEDURE TRAY) ×4 IMPLANT
SHEATH PINNACLE 5F 10CM (SHEATH) ×2 IMPLANT
WIRE HITORQ VERSACORE ST 145CM (WIRE) ×2 IMPLANT

## 2017-05-29 NOTE — Progress Notes (Signed)
Pt complaining of pressure  in lower abd and inability to void using female urinal device.  Call Placed to Feliz Beam NP, orders received to get Pt OOB to bedside commode with bedrest. Pt up to commode without complication. Voided 1100 ML clear yellow urine. Lower ABD pressure resolved. Right groin site intact post getting OOB. Will continue to monitor.

## 2017-07-24 ENCOUNTER — Encounter: Payer: Self-pay | Admitting: Anesthesiology

## 2017-07-24 ENCOUNTER — Ambulatory Visit
Admission: RE | Admit: 2017-07-24 | Discharge: 2017-07-24 | Disposition: A | Discharge status: Home / Self Care | Payer: 59 | Source: Ambulatory Visit | Attending: Anesthesiology | Admitting: Anesthesiology

## 2017-07-24 ENCOUNTER — Ambulatory Visit (HOSPITAL_BASED_OUTPATIENT_CLINIC_OR_DEPARTMENT_OTHER): Payer: 59 | Admitting: Anesthesiology

## 2017-07-24 ENCOUNTER — Other Ambulatory Visit: Payer: Self-pay | Admitting: Anesthesiology

## 2017-07-24 VITALS — BP 150/72 | HR 74 | Temp 98.2°F | Resp 12 | Ht 64.0 in | Wt 185.0 lb

## 2017-07-24 DIAGNOSIS — R52 Pain, unspecified: Secondary | ICD-10-CM

## 2017-07-24 DIAGNOSIS — M5432 Sciatica, left side: Secondary | ICD-10-CM | POA: Diagnosis present

## 2017-07-24 DIAGNOSIS — M5386 Other specified dorsopathies, lumbar region: Secondary | ICD-10-CM

## 2017-07-24 DIAGNOSIS — M5136 Other intervertebral disc degeneration, lumbar region: Secondary | ICD-10-CM

## 2017-07-24 DIAGNOSIS — M25551 Pain in right hip: Secondary | ICD-10-CM | POA: Diagnosis not present

## 2017-07-24 DIAGNOSIS — Z7902 Long term (current) use of antithrombotics/antiplatelets: Secondary | ICD-10-CM | POA: Insufficient documentation

## 2017-07-24 DIAGNOSIS — Z7982 Long term (current) use of aspirin: Secondary | ICD-10-CM | POA: Insufficient documentation

## 2017-07-24 DIAGNOSIS — Z79899 Other long term (current) drug therapy: Secondary | ICD-10-CM | POA: Insufficient documentation

## 2017-07-24 DIAGNOSIS — M5431 Sciatica, right side: Secondary | ICD-10-CM | POA: Diagnosis not present

## 2017-07-24 MED ORDER — SODIUM CHLORIDE 0.9% FLUSH
10.0000 mL | Freq: Once | INTRAVENOUS | Status: AC
  Administered 2017-07-24: 10 mL

## 2017-07-24 MED ORDER — LIDOCAINE HCL (PF) 1 % IJ SOLN
5.0000 mL | Freq: Once | INTRAMUSCULAR | Status: AC
  Administered 2017-07-24: 5 mL via SUBCUTANEOUS
  Filled 2017-07-24: qty 5

## 2017-07-24 MED ORDER — IOPAMIDOL (ISOVUE-M 200) INJECTION 41%
20.0000 mL | Freq: Once | INTRAMUSCULAR | Status: DC | PRN
  Administered 2017-07-24: 10 mL
  Filled 2017-07-24: qty 20

## 2017-07-24 MED ORDER — TRIAMCINOLONE ACETONIDE 40 MG/ML IJ SUSP
40.0000 mg | Freq: Once | INTRAMUSCULAR | Status: AC
  Administered 2017-07-24: 40 mg
  Filled 2017-07-24: qty 1

## 2017-07-24 MED ORDER — TRAMADOL HCL 50 MG PO TABS: 100.0000 mg | ORAL_TABLET | Freq: Four times a day (QID) | ORAL | 2 refills | Status: DC | PRN

## 2017-07-24 MED ORDER — ROPIVACAINE HCL 2 MG/ML IJ SOLN
10.0000 mL | Freq: Once | INTRAMUSCULAR | Status: AC
  Administered 2017-07-24: 10 mL via EPIDURAL
  Filled 2017-07-24: qty 10

## 2017-07-24 NOTE — Progress Notes (Signed)
Nursing Pain Medication Assessment:  Safety precautions to be maintained throughout the outpatient stay will include: orient to surroundings, keep bed in low position, maintain call bell within reach at all times, provide assistance with transfer out of bed and ambulation.  Medication Inspection Compliance: Pill count conducted under aseptic conditions, in front of the patient. Neither the pills nor the bottle was removed from the patient's sight at any time. Once count was completed pills were immediately returned to the patient in their original bottle.  Medication: Tramadol (Ultram) Pill/Patch Count: 23 of 240 pills remain Pill/Patch Appearance: Markings consistent with prescribed medication Bottle Appearance: Standard pharmacy container. Clearly labeled. Filled Date: 02 / 12 / 2018 Last Medication intake:  Today   Patient brought old bottle after splitting pills to keep some in purse and some at home.

## 2017-07-24 NOTE — Progress Notes (Signed)
Subjective:  Patient ID: Christina Zamora, female    DOB: 1960/11/10  Age: 56 y.o. MRN: [id number]  CC: Hip Pain (right)   Procedure: L4-5 epidural steroid under fluoroscopic guidance without sedation  HPI [name] presents for reevaluation.  She was last seen a few months ago and had an epidural at that time.  She reports that she gets good relief with these lasting several weeks.  Her last injection lasted 6-8 weeks with 75% improvement in her low back and leg pain.  Previously her pain was primarily left-sided however she is reporting more right-sided pain today.  No change in lower extremity strength or function as noted in her bowel and bladder function is been stable.  Otherwise she is in her usual state of health.  She has occasionally had some right leg give way weakness.  She has been off her Plavix for 7 days and she continues to derive good relief with her Ultram that she takes approximately 2-3 times a day.  Based on her narcotic assessment sheet she continues to derive good functional lifestyle improvement with her medications.  Outpatient Medications Prior to Visit  Medication Sig Dispense Refill  . amLODipine (NORVASC) 10 MG tablet Take 10 mg by mouth daily.    Marland Kitchen atorvastatin (LIPITOR) 80 MG tablet Take 80 mg by mouth daily.    Marland Kitchen buPROPion (WELLBUTRIN SR) 150 MG 12 hr tablet Take 150 mg by mouth 2 (two) times daily.     . celecoxib (CELEBREX) 200 MG capsule Take 200 mg by mouth every morning.    . clopidogrel (PLAVIX) 75 MG tablet Take 75 mg by mouth daily.    . diphenhydrAMINE (BENADRYL) 25 mg capsule Take 25 mg by mouth at bedtime.    Marland Kitchen ezetimibe (ZETIA) 10 MG tablet Take 10 mg by mouth daily.    . hydrochlorothiazide (HYDRODIURIL) 25 MG tablet Take 1 tablet by mouth daily.    Marland Kitchen L-Lysine 500 MG CAPS Take 1 capsule by mouth daily.    Marland Kitchen levothyroxine (SYNTHROID, LEVOTHROID) 200 MCG tablet Take 200 mcg by mouth daily before breakfast.     . Multiple Vitamin  (MULTIVITAMIN) tablet Take 1 tablet by mouth daily.    . nitroGLYCERIN (NITROSTAT) 0.4 MG SL tablet Place 1 tablet (0.4 mg total) under the tongue every 5 (five) minutes as needed for chest pain. 10 tablet 0  . Omega-3 Fatty Acids (FISH OIL) 1000 MG CAPS Take 1 capsule by mouth daily.    . pantoprazole (PROTONIX) 40 MG tablet Take 40 mg by mouth daily as needed.     . ramipril (ALTACE) 10 MG capsule Take 10 mg by mouth daily.     . traMADol (ULTRAM) 50 MG tablet Take 2 tablets (100 mg total) by mouth every 6 (six) hours as needed for moderate pain. 240 tablet 2  . aspirin EC 81 MG tablet Take 81 mg by mouth daily.     Marland Kitchen loratadine (CLARITIN) 10 MG tablet Take 10 mg by mouth daily.     Facility-Administered Medications Prior to Visit  Medication Dose Route Frequency Provider Last Rate Last Dose  . iohexol (OMNIPAQUE) 180 MG/ML injection 20 mL  20 mL Other Once PRN Molli Barrows, MD   4 mL at 12/05/15 1545  . iopamidol (ISOVUE-M) 41 % intrathecal injection 20 mL  20 mL Other Once PRN Molli Barrows, MD      . iopamidol (ISOVUE-M) 41 % intrathecal injection 20 mL  20 mL Other  Once PRN Molli Barrows, MD      . iopamidol (ISOVUE-M) 41 % intrathecal injection 20 mL  20 mL Other Once PRN Molli Barrows, MD      . lactated ringers infusion 1,000 mL  1,000 mL Intravenous Continuous Molli Barrows, MD      . sodium chloride flush (NS) 0.9 % injection 10 mL  10 mL Other Once Molli Barrows, MD      . sodium chloride flush (NS) 0.9 % injection 10 mL  10 mL Other Once Molli Barrows, MD        Review of Systems CNS: No sedation or confusion GI: No constipation or abdominal pain Cardiac: No angina or palpitations   Objective:  BP (!) 150/72   Pulse 74   Temp 98.2 F (36.8 C) (Oral)   Resp 12   Ht 5\' 4"  (1.626 m)   Wt 185 lb (83.9 kg)   SpO2 99%   BMI 31.76 kg/m    BP Readings from Last 3 Encounters:  07/24/17 (!) 150/72  05/29/17 129/73  05/07/17 128/66     Wt Readings from Last 3  Encounters:  07/24/17 185 lb (83.9 kg)  05/29/17 185 lb (83.9 kg)  05/07/17 185 lb (83.9 kg)     Physical Exam Pt is alert and oriented PERRL EOMI HEART IS RRR no murmur or rub LCTA no wheezing or rhales MUSCULOSKELETAL some paraspinous muscle tenderness but no overt trigger points.  She does have a positive straight leg raise on the right side negative on the left and her muscle tone and bulk is at baseline.  Labs  Lab Results  Component Value Date   HGBA1C 5.1 03/31/2013   HGBA1C 5.3 02/18/2013   Lab Results  Component Value Date   LDLCALC 137 (H) 03/31/2013   CREATININE 0.71 08/25/2015    -------------------------------------------------------------------------------------------------------------------- Lab Results  Component Value Date   WBC 5.2 08/25/2015   HGB 12.6 08/25/2015   HCT 37.7 08/25/2015   PLT 217 08/25/2015   GLUCOSE 127 (H) 08/25/2015   CHOL 222 (H) 03/31/2013   TRIG 198 03/31/2013   HDL 45 03/31/2013   LDLCALC 137 (H) 03/31/2013   ALT 24 08/06/2009   AST 27 08/06/2009   NA 142 08/25/2015   K 3.7 08/25/2015   CL 107 08/25/2015   CREATININE 0.71 08/25/2015   BUN 22 (H) 08/25/2015   CO2 27 08/25/2015   TSH 37.5 (H) 03/31/2013   INR 0.9 08/30/2014   HGBA1C 5.1 03/31/2013    --------------------------------------------------------------------------------------------------------------------- Dg C-arm 1-60 Min-no Report  Result Date: 07/24/2017 Fluoroscopy was utilized by the requesting physician.  No radiographic interpretation.     Assessment & Plan:   Danalee was seen today for hip pain.  Diagnoses and all orders for this visit:  Sciatica of left side associated with disorder of lumbar spine -     Lumbar Epidural Injection  Sciatica of right side -     triamcinolone acetonide (KENALOG-40) injection 40 mg; 1 mL (40 mg total) by Other route once. -     sodium chloride flush (NS) 0.9 % injection 10 mL; 10 mLs by Other route once. -      ropivacaine (PF) 2 mg/mL (0.2%) (NAROPIN) injection 10 mL; 10 mLs by Epidural route once. -     lidocaine (PF) (XYLOCAINE) 1 % injection 5 mL; Inject 5 mLs into the skin once. -     iopamidol (ISOVUE-M) 41 % intrathecal injection 20 mL; 20 mLs  by Other route once as needed for contrast. -     MR LUMBAR SPINE [place]; Future  Sciatica of right side associated with disorder of lumbar spine -     Lumbar Epidural Injection; Future  DDD (degenerative disc disease), lumbar -     triamcinolone acetonide (KENALOG-40) injection 40 mg; 1 mL (40 mg total) by Other route once. -     sodium chloride flush (NS) 0.9 % injection 10 mL; 10 mLs by Other route once. -     ropivacaine (PF) 2 mg/mL (0.2%) (NAROPIN) injection 10 mL; 10 mLs by Epidural route once. -     lidocaine (PF) (XYLOCAINE) 1 % injection 5 mL; Inject 5 mLs into the skin once. -     iopamidol (ISOVUE-M) 41 % intrathecal injection 20 mL; 20 mLs by Other route once as needed for contrast.  Other orders -     traMADol (ULTRAM) 50 MG tablet; Take 2 tablets (100 mg total) by mouth every 6 (six) hours as needed for moderate pain.        ----------------------------------------------------------------------------------------------------------------------  Problem List Items Addressed This Visit    None    Visit Diagnoses    Sciatica of left side associated with disorder of lumbar spine    -  Primary   Relevant Medications   buPROPion (ZYBAN) 150 MG 12 hr tablet   Sciatica of right side       Relevant Medications   buPROPion (ZYBAN) 150 MG 12 hr tablet   triamcinolone acetonide (KENALOG-40) injection 40 mg (Completed)   sodium chloride flush (NS) 0.9 % injection 10 mL (Completed)   ropivacaine (PF) 2 mg/mL (0.2%) (NAROPIN) injection 10 mL (Completed)   lidocaine (PF) (XYLOCAINE) 1 % injection 5 mL (Completed)   iopamidol (ISOVUE-M) 41 % intrathecal injection 20 mL   Other Relevant Orders   MR LUMBAR SPINE WO CONTRAST   Sciatica  of right side associated with disorder of lumbar spine       Relevant Medications   buPROPion (ZYBAN) 150 MG 12 hr tablet   Other Relevant Orders   Lumbar Epidural Injection   DDD (degenerative disc disease), lumbar       Relevant Medications   traMADol (ULTRAM) 50 MG tablet   triamcinolone acetonide (KENALOG-40) injection 40 mg (Completed)   sodium chloride flush (NS) 0.9 % injection 10 mL (Completed)   ropivacaine (PF) 2 mg/mL (0.2%) (NAROPIN) injection 10 mL (Completed)   lidocaine (PF) (XYLOCAINE) 1 % injection 5 mL (Completed)   iopamidol (ISOVUE-M) 41 % intrathecal injection 20 mL        ----------------------------------------------------------------------------------------------------------------------  1. Sciatica of left side associated with disorder of lumbar spine We will proceed with repeat epidural injection today.  Gone over the risks and benefits with her in full detail all questions answered.  We will have her return to clinic in approximately 2-3 months for repeat injection at that time.  Secondary to the give way weakness that she is experienced on the right side I am going to refer her for an MRI evaluation without contrast of the lumbar spine. - Lumbar Epidural Injection  2. Sciatica of right side As above - triamcinolone acetonide (KENALOG-40) injection 40 mg; 1 mL (40 mg total) by Other route once. - sodium chloride flush (NS) 0.9 % injection 10 mL; 10 mLs by Other route once. - ropivacaine (PF) 2 mg/mL (0.2%) (NAROPIN) injection 10 mL; 10 mLs by Epidural route once. - lidocaine (PF) (XYLOCAINE) 1 % injection 5 mL;  Inject 5 mLs into the skin once. - iopamidol (ISOVUE-M) 41 % intrathecal injection 20 mL; 20 mLs by Other route once as needed for contrast. - MR LUMBAR SPINE [place]; Future  3. Sciatica of right side associated with disorder of lumbar spine As above - Lumbar Epidural Injection; Future  4. DDD (degenerative disc disease), lumbar Return to  clinic in approximately 2-3 months with refill of Ultram given today.  We have reviewed the [facility] practitioner database information and it is appropriate. - triamcinolone acetonide (KENALOG-40) injection 40 mg; 1 mL (40 mg total) by Other route once. - sodium chloride flush (NS) 0.9 % injection 10 mL; 10 mLs by Other route once. - ropivacaine (PF) 2 mg/mL (0.2%) (NAROPIN) injection 10 mL; 10 mLs by Epidural route once. - lidocaine (PF) (XYLOCAINE) 1 % injection 5 mL; Inject 5 mLs into the skin once. - iopamidol (ISOVUE-M) 41 % intrathecal injection 20 mL; 20 mLs by Other route once as needed for contrast.    ----------------------------------------------------------------------------------------------------------------------  I am having Ms. Melendrez maintain her clopidogrel, atorvastatin, celecoxib, levothyroxine, amLODipine, pantoprazole, nitroGLYCERIN, multivitamin, aspirin EC, buPROPion, ramipril, ezetimibe, Fish Oil, L-Lysine, loratadine, diphenhydrAMINE, hydrochlorothiazide, buPROPion, and traMADol. We administered triamcinolone acetonide, sodium chloride flush, ropivacaine (PF) 2 mg/mL (0.2%), lidocaine (PF), and iopamidol. We will continue to administer sodium chloride flush, iohexol, iopamidol, lactated ringers, iopamidol, sodium chloride flush, and iopamidol.   Meds ordered this encounter  Medications  . buPROPion (ZYBAN) 150 MG 12 hr tablet    Sig: Take 150 mg by mouth 2 (two) times daily.    Refill:  0  . traMADol (ULTRAM) 50 MG tablet    Sig: Take 2 tablets (100 mg total) by mouth every 6 (six) hours as needed for moderate pain.    Dispense:  180 tablet    Refill:  2  . triamcinolone acetonide (KENALOG-40) injection 40 mg  . sodium chloride flush (NS) 0.9 % injection 10 mL  . ropivacaine (PF) 2 mg/mL (0.2%) (NAROPIN) injection 10 mL  . lidocaine (PF) (XYLOCAINE) 1 % injection 5 mL  . iopamidol (ISOVUE-M) 41 % intrathecal injection 20 mL   Patient's Medications  New  Prescriptions   No medications on file  Previous Medications   AMLODIPINE (NORVASC) 10 MG TABLET    Take 10 mg by mouth daily.   ASPIRIN EC 81 MG TABLET    Take 81 mg by mouth daily.    ATORVASTATIN (LIPITOR) 80 MG TABLET    Take 80 mg by mouth daily.   BUPROPION (WELLBUTRIN SR) 150 MG 12 HR TABLET    Take 150 mg by mouth 2 (two) times daily.    BUPROPION (ZYBAN) 150 MG 12 HR TABLET    Take 150 mg by mouth 2 (two) times daily.   CELECOXIB (CELEBREX) 200 MG CAPSULE    Take 200 mg by mouth every morning.   CLOPIDOGREL (PLAVIX) 75 MG TABLET    Take 75 mg by mouth daily.   DIPHENHYDRAMINE (BENADRYL) 25 MG CAPSULE    Take 25 mg by mouth at bedtime.   EZETIMIBE (ZETIA) 10 MG TABLET    Take 10 mg by mouth daily.   HYDROCHLOROTHIAZIDE (HYDRODIURIL) 25 MG TABLET    Take 1 tablet by mouth daily.   L-LYSINE 500 MG CAPS    Take 1 capsule by mouth daily.   LEVOTHYROXINE (SYNTHROID, LEVOTHROID) 200 MCG TABLET    Take 200 mcg by mouth daily before breakfast.    LORATADINE (CLARITIN) 10 MG TABLET  Take 10 mg by mouth daily.   MULTIPLE VITAMIN (MULTIVITAMIN) TABLET    Take 1 tablet by mouth daily.   NITROGLYCERIN (NITROSTAT) 0.4 MG SL TABLET    Place 1 tablet (0.4 mg total) under the tongue every 5 (five) minutes as needed for chest pain.   OMEGA-3 FATTY ACIDS (FISH OIL) 1000 MG CAPS    Take 1 capsule by mouth daily.   PANTOPRAZOLE (PROTONIX) 40 MG TABLET    Take 40 mg by mouth daily as needed.    RAMIPRIL (ALTACE) 10 MG CAPSULE    Take 10 mg by mouth daily.   Modified Medications   Modified Medication Previous Medication   TRAMADOL (ULTRAM) 50 MG TABLET traMADol (ULTRAM) 50 MG tablet      Take 2 tablets (100 mg total) by mouth every 6 (six) hours as needed for moderate pain.    Take 2 tablets (100 mg total) by mouth every 6 (six) hours as needed for moderate pain.  Discontinued Medications   No medications on file    ---------------------------------------------------------------------------------------------------------------------- Procedure: L4-5 epidural steroid No. 2 under fluoroscopic guidance without sedation.   Procedure: 4 5 LESI with fluoroscopic guidance and moderate sedation  NOTE: The risks, benefits, and expectations of the procedure have been discussed and explained to the patient who was understanding and in agreement with suggested treatment plan. No guarantees were made.  DESCRIPTION OF PROCEDURE: Lumbar epidural steroid injection with no IV Versed, EKG, blood pressure, pulse, and pulse oximetry monitoring. The procedure was performed with the patient in the prone position under fluoroscopic guidance. I injected subcutaneous lidocaine overlying the 4 5 site after its fluoroscopic identifictation.  Using strict aseptic technique, I then advanced an 18-gauge Tuohy epidural needle in the midline using interlaminar approach via loss-of-resistance to saline technique. There was negative aspiration for heme or  CSF.  I then confirmed position with both AP and Lateral fluoroscan.  2 cc of Isovue were injected and a  total of 5 mL of Preservative-Free normal saline mixed with 40 mg of Kenalog and 1cc Ropicaine 0.2 percent were injected incrementally via the  epidurally placed needle. The needle was removed. The patient tolerated the injection well and was convalesced and discharged to home in stable condition. Should the patient have any post procedure difficulty they have been instructed on how to contact us for assistance.   Follow-up: Return in about 3 years (around 07/24/2020) for evaluation, procedure.    Molli Barrows, MD

## 2017-07-27 ENCOUNTER — Telehealth: Payer: Self-pay

## 2017-07-27 NOTE — Telephone Encounter (Signed)
Post procedure phone call.  Left message.  

## 2017-08-06 ENCOUNTER — Telehealth: Payer: Self-pay | Admitting: Anesthesiology

## 2017-08-06 NOTE — Telephone Encounter (Signed)
Calling to check on MRI to see if has been approved, please let patient know

## 2017-09-04 ENCOUNTER — Ambulatory Visit
Admission: RE | Admit: 2017-09-04 | Discharge: 2017-09-04 | Disposition: A | Discharge status: Home / Self Care | Payer: 59 | Source: Ambulatory Visit | Attending: Anesthesiology | Admitting: Anesthesiology

## 2017-09-04 DIAGNOSIS — M4186 Other forms of scoliosis, lumbar region: Secondary | ICD-10-CM | POA: Insufficient documentation

## 2017-09-04 DIAGNOSIS — M5127 Other intervertebral disc displacement, lumbosacral region: Secondary | ICD-10-CM | POA: Insufficient documentation

## 2017-09-04 DIAGNOSIS — M5116 Intervertebral disc disorders with radiculopathy, lumbar region: Secondary | ICD-10-CM | POA: Insufficient documentation

## 2017-09-04 DIAGNOSIS — M5431 Sciatica, right side: Secondary | ICD-10-CM | POA: Diagnosis present

## 2017-09-04 DIAGNOSIS — M8938 Hypertrophy of bone, other site: Secondary | ICD-10-CM | POA: Diagnosis not present

## 2017-09-10 ENCOUNTER — Other Ambulatory Visit: Payer: Self-pay | Admitting: Nurse Practitioner

## 2017-09-10 DIAGNOSIS — Z1231 Encounter for screening mammogram for malignant neoplasm of breast: Secondary | ICD-10-CM

## 2017-09-29 ENCOUNTER — Encounter: Payer: Self-pay | Admitting: Anesthesiology

## 2017-09-29 ENCOUNTER — Other Ambulatory Visit: Payer: Self-pay

## 2017-09-29 ENCOUNTER — Ambulatory Visit: Payer: 59 | Attending: Anesthesiology | Admitting: Anesthesiology

## 2017-09-29 VITALS — BP 126/66 | HR 86 | Temp 98.5°F | Resp 16 | Ht 64.0 in | Wt 190.0 lb

## 2017-09-29 DIAGNOSIS — M48061 Spinal stenosis, lumbar region without neurogenic claudication: Secondary | ICD-10-CM | POA: Insufficient documentation

## 2017-09-29 DIAGNOSIS — M5136 Other intervertebral disc degeneration, lumbar region: Secondary | ICD-10-CM

## 2017-09-29 DIAGNOSIS — M5441 Lumbago with sciatica, right side: Secondary | ICD-10-CM | POA: Diagnosis not present

## 2017-09-29 DIAGNOSIS — M5126 Other intervertebral disc displacement, lumbar region: Secondary | ICD-10-CM | POA: Insufficient documentation

## 2017-09-29 DIAGNOSIS — M5431 Sciatica, right side: Secondary | ICD-10-CM

## 2017-09-29 DIAGNOSIS — M25551 Pain in right hip: Secondary | ICD-10-CM | POA: Insufficient documentation

## 2017-09-29 DIAGNOSIS — M47817 Spondylosis without myelopathy or radiculopathy, lumbosacral region: Secondary | ICD-10-CM

## 2017-09-29 DIAGNOSIS — M5127 Other intervertebral disc displacement, lumbosacral region: Secondary | ICD-10-CM | POA: Insufficient documentation

## 2017-09-29 DIAGNOSIS — Z79899 Other long term (current) drug therapy: Secondary | ICD-10-CM | POA: Diagnosis not present

## 2017-09-29 DIAGNOSIS — M4697 Unspecified inflammatory spondylopathy, lumbosacral region: Secondary | ICD-10-CM | POA: Diagnosis not present

## 2017-09-29 DIAGNOSIS — G8929 Other chronic pain: Secondary | ICD-10-CM | POA: Diagnosis not present

## 2017-09-29 DIAGNOSIS — M545 Low back pain: Secondary | ICD-10-CM | POA: Diagnosis present

## 2017-09-29 NOTE — Progress Notes (Signed)
Nursing Pain Medication Assessment:  Safety precautions to be maintained throughout the outpatient stay will include: orient to surroundings, keep bed in low position, maintain call bell within reach at all times, provide assistance with transfer out of bed and ambulation.  Medication Inspection Compliance: Christina Zamora did not comply with our request to bring her pills to be counted. She was reminded that bringing the medication bottles, even when empty, is a requirement.  Medication: None brought in. Pill/Patch Count: None available to be counted. Bottle Appearance: No container available. Did not bring bottle(s) to appointment. Filled Date: N/A Last Medication intake:  Today  Reminded to bring opioids to all appointments even if bottle is empty

## 2017-10-01 NOTE — Progress Notes (Signed)
Subjective:  Patient ID: Christina Zamora, female    DOB: May 16, 1961  Age: 57 y.o. MRN: [id number]  CC: Back Pain (low)   Procedure: None  HPI Christina Zamora presents for reevaluation.  She was seen back in November at which point she had an epidural for her right lower extremity pain.  The pain in the right leg has resolved.  She is having some mild right hip pain and low back pain but this is tolerable at this time.  She has been doing some exercises and physical therapy like modalities to help increase her core strength to the lower back.  Furthermore she has been taking Celebrex and intermittent Flexeril to help with low back spasming.  The pain she now describes is primarily in the low back with radiation into the right hip and posterior leg.  She has had a recent MRI as well.  No change in lower extremity strength or function are noted at this time.  Her bowel bladder function has also been stable.  Outpatient Medications Prior to Visit  Medication Sig Dispense Refill  . amLODipine (NORVASC) 10 MG tablet Take 10 mg by mouth daily.    Marland Kitchen aspirin EC 81 MG tablet Take 81 mg by mouth daily.     Marland Kitchen atorvastatin (LIPITOR) 80 MG tablet Take 80 mg by mouth daily.    Marland Kitchen buPROPion (WELLBUTRIN SR) 150 MG 12 hr tablet Take 150 mg by mouth 2 (two) times daily.     . celecoxib (CELEBREX) 200 MG capsule Take 200 mg by mouth every morning.    . clopidogrel (PLAVIX) 75 MG tablet Take 75 mg by mouth daily.    . diphenhydrAMINE (BENADRYL) 25 mg capsule Take 25 mg by mouth at bedtime.    Marland Kitchen ezetimibe (ZETIA) 10 MG tablet Take 10 mg by mouth daily.    . hydrochlorothiazide (HYDRODIURIL) 25 MG tablet Take 1 tablet by mouth daily.    Marland Kitchen L-Lysine 500 MG CAPS Take 1 capsule by mouth daily.    Marland Kitchen levothyroxine (SYNTHROID, LEVOTHROID) 200 MCG tablet Take 200 mcg by mouth daily before breakfast.     . loratadine (CLARITIN) 10 MG tablet Take 10 mg by mouth daily.    . Multiple Vitamin (MULTIVITAMIN) tablet Take 1  tablet by mouth daily.    . nitroGLYCERIN (NITROSTAT) 0.4 MG SL tablet Place 1 tablet (0.4 mg total) under the tongue every 5 (five) minutes as needed for chest pain. 10 tablet 0  . Omega-3 Fatty Acids (FISH OIL) 1000 MG CAPS Take 1 capsule by mouth daily.    . pantoprazole (PROTONIX) 40 MG tablet Take 40 mg by mouth daily as needed.     . ramipril (ALTACE) 10 MG capsule Take 10 mg by mouth daily.     . traMADol (ULTRAM) 50 MG tablet Take 2 tablets (100 mg total) by mouth every 6 (six) hours as needed for moderate pain. 180 tablet 2  . buPROPion (ZYBAN) 150 MG 12 hr tablet Take 150 mg by mouth 2 (two) times daily.  0  . cyclobenzaprine (FLEXERIL) 10 MG tablet TAKE 1 TABLET ORALLY NIGHTLY AT BEDTIME FOR MUSCLE RELAXANT, WILL MAKE DROWSY  1   Facility-Administered Medications Prior to Visit  Medication Dose Route Frequency Provider Last Rate Last Dose  . iohexol (OMNIPAQUE) 180 MG/ML injection 20 mL  20 mL Other Once PRN Molli Barrows, MD   4 mL at 12/05/15 1545  . iopamidol (ISOVUE-M) 41 % intrathecal injection 20 mL  20 mL Other Once PRN Molli Barrows, MD      . iopamidol (ISOVUE-M) 41 % intrathecal injection 20 mL  20 mL Other Once PRN Molli Barrows, MD      . iopamidol (ISOVUE-M) 41 % intrathecal injection 20 mL  20 mL Other Once PRN Molli Barrows, MD      . lactated ringers infusion 1,000 mL  1,000 mL Intravenous Continuous Molli Barrows, MD      . sodium chloride flush (NS) 0.9 % injection 10 mL  10 mL Other Once Molli Barrows, MD      . sodium chloride flush (NS) 0.9 % injection 10 mL  10 mL Other Once Molli Barrows, MD        Review of Systems CNS: No confusion or sedation Cardiac: No angina or palpitations GI: No abdominal pain or constipation   Objective:  BP 126/66   Pulse 86   Temp 98.5 F (36.9 C)   Resp 16   Ht 5\' 4"  (1.626 m)   Wt 190 lb (86.2 kg)   SpO2 98%   BMI 32.61 kg/m    BP Readings from Last 3 Encounters:  09/29/17 126/66  07/24/17 (!) 150/72   05/29/17 129/73     Wt Readings from Last 3 Encounters:  09/29/17 190 lb (86.2 kg)  07/24/17 185 lb (83.9 kg)  05/29/17 185 lb (83.9 kg)     Physical Exam Pt is alert and oriented PERRL EOMI HEART IS RRR no murmur or rub LCTA no wheezing or rales MUSCULOSKELETAL reveals some pain on extension with right lateral rotation yielding reproduction of her primary pain complaint at this point.  She has some mild pain with left lateral rotation.  She has a negative straight leg raise on the right side.  Her muscle tone and bulk to the lower extremities is well-preserved.  Labs  Lab Results  Component Value Date   HGBA1C 5.1 03/31/2013   HGBA1C 5.3 02/18/2013   Lab Results  Component Value Date   LDLCALC 137 (H) 03/31/2013   CREATININE 0.71 08/25/2015    -------------------------------------------------------------------------------------------------------------------- Lab Results  Component Value Date   WBC 5.2 08/25/2015   HGB 12.6 08/25/2015   HCT 37.7 08/25/2015   PLT 217 08/25/2015   GLUCOSE 127 (H) 08/25/2015   CHOL 222 (H) 03/31/2013   TRIG 198 03/31/2013   HDL 45 03/31/2013   LDLCALC 137 (H) 03/31/2013   ALT 24 08/06/2009   AST 27 08/06/2009   NA 142 08/25/2015   K 3.7 08/25/2015   CL 107 08/25/2015   CREATININE 0.71 08/25/2015   BUN 22 (H) 08/25/2015   CO2 27 08/25/2015   TSH 37.5 (H) 03/31/2013   INR 0.9 08/30/2014   HGBA1C 5.1 03/31/2013    --------------------------------------------------------------------------------------------------------------------- Mr Lumbar Spine Wo Contrast  Result Date: 09/04/2017 CLINICAL DATA:  Sciatica of the right side. Radiculopathy for greater than 6 weeks. EXAM: MRI LUMBAR SPINE WITHOUT CONTRAST TECHNIQUE: Multiplanar, multisequence MR imaging of the lumbar spine was performed. No intravenous contrast was administered. COMPARISON:  None. FINDINGS: Segmentation: 5 non rib-bearing lumbar type vertebral bodies are present.  Alignment: AP alignment is anatomic. Levoconvex curvature is centered at L3. Vertebrae: Mild endplate marrow changes are present on the right at L2-3 and L3-4. Conus medullaris and cauda equina: Conus extends to the L1 level. Conus and cauda equina appear normal. Paraspinal and other soft tissues: Limited imaging of the abdomen is unremarkable. No significant adenopathy is present. Disc levels:  T12-L1:  Negative. L1-2: A mild broad-based disc protrusion is present. Mild facet hypertrophy is worse on the right. The central canal is patent. Mild foraminal narrowing is evident bilaterally. L2-3: A broad-based disc protrusion is present. Asymmetric right-sided facet hypertrophy contributes to mild right subarticular narrowing. Mild right foraminal narrowing is present. L3-4: A broad-based disc protrusion is present. Moderate facet hypertrophy is worse on the right. Moderate right and mild left subarticular narrowing is present. Moderate right and mild left foraminal narrowing is present. L4-5: A broad-based disc protrusion is asymmetric to the left. Moderate left facet hypertrophy is noted. Mild left subarticular and moderate left foraminal narrowing is evident. L5-S1: A leftward disc protrusion is present. A far left annular tear is present. Mild facet hypertrophy is noted bilaterally. This results in mild left foraminal narrowing. IMPRESSION: 1. Cyst mild right subarticular and foraminal narrowing at L2-3 secondary to a broad-based disc protrusion and asymmetric right-sided facet hypertrophy. 2. Moderate right and mild left subarticular and foraminal narrowing at L3-4 secondary to a broad-based disc protrusion and asymmetric right-sided facet hypertrophy. 3. Mild left subarticular and moderate left foraminal narrowing at L4-5 secondary to asymmetric left-sided facet hypertrophy and disc protrusion. 4. Mild left foraminal narrowing at L5-S1 secondary to a leftward disc protrusion and far left annular tear. 5. Mild  foraminal narrowing bilaterally at L1-2. 6. Levoconvex scoliosis of the lumbar spine is centered at L3 resulting in asymmetric right-sided disease along the concavity. Electronically Signed   By: San Morelle M.D.   On: 09/04/2017 08:45     Assessment & Plan:   Leslyn was seen today for back pain.  Diagnoses and all orders for this visit:  Sciatica of right side  DDD (degenerative disc disease), lumbar  Facet arthritis of lumbosacral region [facility])  Chronic bilateral low back pain with right-sided sciatica        ----------------------------------------------------------------------------------------------------------------------  Problem List Items Addressed This Visit    None    Visit Diagnoses    Sciatica of right side    -  Primary   Relevant Medications   cyclobenzaprine (FLEXERIL) 10 MG tablet   DDD (degenerative disc disease), lumbar       Relevant Medications   cyclobenzaprine (FLEXERIL) 10 MG tablet   Facet arthritis of lumbosacral region ([place])       Relevant Medications   cyclobenzaprine (FLEXERIL) 10 MG tablet   Chronic bilateral low back pain with right-sided sciatica       Relevant Medications   cyclobenzaprine (FLEXERIL) 10 MG tablet        ----------------------------------------------------------------------------------------------------------------------  1. Sciatica of right side This seems to have improved and we will defer on any repeat epidural injections at this time.  I want her to continue with her core stretching strengthening exercises and continue use of Celebrex and Flexeril for as needed needs on her low back pain and spasming.  2. DDD (degenerative disc disease), lumbar As above  3. Facet arthritis of lumbosacral region [facility]) She would be a candidate for diagnostic facet block.  We have reviewed her MRI results with her in detail today.  She does have evidence of facet agenic etiology for her pain and this would be a  reasonable procedure to help with her pain relief.  4. Chronic bilateral low back pain with right-sided sciatica As above.  She is to return to clinic in approximately 2 months or sooner if necessary for evaluation and possible repeat injection if indicated.    ----------------------------------------------------------------------------------------------------------------------  I am having  Christina Zamora maintain her clopidogrel, atorvastatin, celecoxib, levothyroxine, amLODipine, pantoprazole, nitroGLYCERIN, multivitamin, aspirin EC, buPROPion, ramipril, ezetimibe, Fish Oil, L-Lysine, loratadine, diphenhydrAMINE, hydrochlorothiazide, buPROPion, traMADol, and cyclobenzaprine. We will continue to administer sodium chloride flush, iohexol, iopamidol, lactated ringers, iopamidol, sodium chloride flush, and iopamidol.   No orders of the defined types were placed in this encounter.  Patient's Medications  New Prescriptions   No medications on file  Previous Medications   AMLODIPINE (NORVASC) 10 MG TABLET    Take 10 mg by mouth daily.   ASPIRIN EC 81 MG TABLET    Take 81 mg by mouth daily.    ATORVASTATIN (LIPITOR) 80 MG TABLET    Take 80 mg by mouth daily.   BUPROPION (WELLBUTRIN SR) 150 MG 12 HR TABLET    Take 150 mg by mouth 2 (two) times daily.    BUPROPION (ZYBAN) 150 MG 12 HR TABLET    Take 150 mg by mouth 2 (two) times daily.   CELECOXIB (CELEBREX) 200 MG CAPSULE    Take 200 mg by mouth every morning.   CLOPIDOGREL (PLAVIX) 75 MG TABLET    Take 75 mg by mouth daily.   CYCLOBENZAPRINE (FLEXERIL) 10 MG TABLET    TAKE 1 TABLET ORALLY NIGHTLY AT BEDTIME FOR MUSCLE RELAXANT, WILL MAKE DROWSY   DIPHENHYDRAMINE (BENADRYL) 25 MG CAPSULE    Take 25 mg by mouth at bedtime.   EZETIMIBE (ZETIA) 10 MG TABLET    Take 10 mg by mouth daily.   HYDROCHLOROTHIAZIDE (HYDRODIURIL) 25 MG TABLET    Take 1 tablet by mouth daily.   L-LYSINE 500 MG CAPS    Take 1 capsule by mouth daily.   LEVOTHYROXINE  (SYNTHROID, LEVOTHROID) 200 MCG TABLET    Take 200 mcg by mouth daily before breakfast.    LORATADINE (CLARITIN) 10 MG TABLET    Take 10 mg by mouth daily.   MULTIPLE VITAMIN (MULTIVITAMIN) TABLET    Take 1 tablet by mouth daily.   NITROGLYCERIN (NITROSTAT) 0.4 MG SL TABLET    Place 1 tablet (0.4 mg total) under the tongue every 5 (five) minutes as needed for chest pain.   OMEGA-3 FATTY ACIDS (FISH OIL) 1000 MG CAPS    Take 1 capsule by mouth daily.   PANTOPRAZOLE (PROTONIX) 40 MG TABLET    Take 40 mg by mouth daily as needed.    RAMIPRIL (ALTACE) 10 MG CAPSULE    Take 10 mg by mouth daily.    TRAMADOL (ULTRAM) 50 MG TABLET    Take 2 tablets (100 mg total) by mouth every 6 (six) hours as needed for moderate pain.  Modified Medications   No medications on file  Discontinued Medications   No medications on file   ----------------------------------------------------------------------------------------------------------------------  Follow-up: Return in about 3 months (around 12/28/2017) for evaluation, med refill.    Molli Barrows, MD

## 2017-10-16 ENCOUNTER — Ambulatory Visit
Admission: RE | Admit: 2017-10-16 | Discharge: 2017-10-16 | Disposition: A | Discharge status: Home / Self Care | Payer: 59 | Source: Ambulatory Visit | Attending: Nurse Practitioner | Admitting: Nurse Practitioner

## 2017-10-16 DIAGNOSIS — Z1231 Encounter for screening mammogram for malignant neoplasm of breast: Secondary | ICD-10-CM

## 2019-06-03 ENCOUNTER — Encounter: Payer: Self-pay | Admitting: Internal Medicine

## 2019-06-03 ENCOUNTER — Other Ambulatory Visit: Payer: Self-pay

## 2019-06-03 ENCOUNTER — Ambulatory Visit: Payer: 59 | Admitting: Internal Medicine

## 2019-06-03 VITALS — BP 122/68 | HR 67 | Temp 98.2°F | Ht 64.0 in | Wt 198.4 lb

## 2019-06-03 DIAGNOSIS — G4719 Other hypersomnia: Secondary | ICD-10-CM

## 2019-06-03 DIAGNOSIS — R062 Wheezing: Secondary | ICD-10-CM

## 2019-06-03 DIAGNOSIS — R0602 Shortness of breath: Secondary | ICD-10-CM | POA: Diagnosis not present

## 2019-06-03 MED ORDER — SPIRIVA RESPIMAT 1.25 MCG/ACT IN AERS: 2.0000 | INHALATION_SPRAY | Freq: Two times a day (BID) | RESPIRATORY_TRACT | 6 refills | Status: DC

## 2019-06-03 NOTE — Patient Instructions (Addendum)
CT chest assess for PE  Obtain PFT's Obtain 6MWT Check ONO Obtain SLeep STudy  STart Spiriva Respimat 1.25

## 2019-06-03 NOTE — Progress Notes (Signed)
Name: Christina Zamora MRN: [id number] DOB: 1961/02/29     CONSULTATION DATE: 06/03/2019 REFERRING MD : Diona Browner  CHIEF COMPLAINT: Shortness of breath    HISTORY OF PRESENT ILLNESS: 58 year old pleasant white female seen today for progressive shortness of breath, Associated with the dizzy spells   Has some increased shortness of breath and dyspnea on exertion over the last 6 months  Patient had an echocardiogram performed at her cardiologist office in the last 8 months which is consistent with elevated pulmonary artery pressures  Patient has significant history of cardiac disease with stent She stent placed  at the age of 68  She has increased breathlessness Increased dyspnea on exertion with 20 feet  She is a former smoker Quit 20 years ago 2 pack a day for 30 years  Patient underwent gastric bypass She lost about 100 pounds  Patient does have a previous history of sleep apnea Has been prescribed CPAP in the past  +LOWER EXT EDEMA  No evidence or signs of infection at this time No respiratory distress No fevers, chills, nausea, vomiting, diarrhea No evidence hemoptysis    Patient  has been having sleep problems for many years Patient has been having excessive daytime sleepiness for a long time Patient has been having extreme fatigue and tiredness, lack of energy +  very Loud snoring every night + struggling breathe at night and gasps for air   Discussed sleep data and reviewed with patient.  Encouraged proper weight management.  Discussed driving precautions and its relationship with hypersomnolence.  Discussed operating dangerous equipment and its relationship with hypersomnolence.  Discussed sleep hygiene, and benefits of a fixed sleep waked time.  The importance of getting eight or more hours of sleep discussed with patient.  Discussed limiting the use of the computer and television before bedtime.  Decrease naps during the day, so night time sleep will  become enhanced.  Limit caffeine, and sleep deprivation.  HTN, stroke, and heart failure are potential risk factors.    EPWORTH SLEEP SCORE 19    PAST MEDICAL HISTORY :   has a past medical history of Arthritis, CAD (coronary artery disease), Chronic headaches, GERD (gastroesophageal reflux disease), Hyperlipidemia, Hypertension, Insomnia, Lumbago, Pulmonary HTN ([place]), Thyroid disease, and Vitamin D deficiency.  has a past surgical history that includes Coronary angioplasty with stent; Bilateral carpal tunnel release (2000); Replacement total knee (Left, 2015); Left Heart Cath (Left, 05/29/2017); CORONARY ANGIOGRAPHY (N/A, 05/29/2017); and Breast excisional biopsy (Right, 06/24/2010). Prior to Admission medications   Medication Sig Start Date End Date Taking? Authorizing Provider  amLODipine (NORVASC) 10 MG tablet Take 10 mg by mouth daily.   Yes [provider]  aspirin EC 81 MG tablet Take 81 mg by mouth daily.    Yes [provider]  atorvastatin (LIPITOR) 80 MG tablet Take 80 mg by mouth daily.   Yes [provider]  buPROPion (WELLBUTRIN SR) 150 MG 12 hr tablet Take 150 mg by mouth 2 (two) times daily.    Yes [provider]  buPROPion (ZYBAN) 150 MG 12 hr tablet Take 150 mg by mouth 2 (two) times daily. 07/17/17  Yes [provider]  celecoxib (CELEBREX) 200 MG capsule Take 200 mg by mouth every morning.   Yes [provider]  clopidogrel (PLAVIX) 75 MG tablet Take 75 mg by mouth daily.   Yes [provider]  cyclobenzaprine (FLEXERIL) 10 MG tablet TAKE 1 TABLET ORALLY NIGHTLY AT BEDTIME FOR MUSCLE RELAXANT, WILL MAKE DROWSY 09/09/17  Yes [provider]  ezetimibe (ZETIA) 10 MG tablet Take 10 mg by mouth daily.   Yes [provider]  hydrochlorothiazide (HYDRODIURIL) 25 MG tablet Take 1 tablet by mouth daily. 05/12/17  Yes [provider]  L-Lysine 500 MG CAPS Take 1 capsule by mouth daily.   Yes  [provider]  levothyroxine (SYNTHROID, LEVOTHROID) 200 MCG tablet Take 200 mcg by mouth daily before breakfast.    Yes [provider]  loratadine (CLARITIN) 10 MG tablet Take 10 mg by mouth daily.   Yes [provider]  Multiple Vitamin (MULTIVITAMIN) tablet Take 1 tablet by mouth daily.   Yes [provider]  nitroGLYCERIN (NITROSTAT) 0.4 MG SL tablet Place 1 tablet (0.4 mg total) under the tongue every 5 (five) minutes as needed for chest pain. 08/26/15  Yes Gouru, Illene Silver, MD  Omega-3 Fatty Acids (FISH OIL) 1000 MG CAPS Take 1 capsule by mouth daily.   Yes [provider]  pantoprazole (PROTONIX) 40 MG tablet Take 40 mg by mouth daily as needed.    Yes [provider]  ramipril (ALTACE) 10 MG capsule Take 10 mg by mouth daily.    Yes [provider]  traMADol (ULTRAM) 50 MG tablet Take 2 tablets (100 mg total) by mouth every 6 (six) hours as needed for moderate pain. 07/24/17  Yes Molli Barrows, MD   Allergies  Allergen Reactions  . Hydralazine Hcl Swelling and Palpitations    FAMILY HISTORY:  family history includes Diabetes in her mother; Heart attack in her father; Heart failure in her mother; Hyperlipidemia in her mother. SOCIAL HISTORY:  reports that she quit smoking about 18 years ago. She has a 10.00 pack-year smoking history. She has never used smokeless tobacco. She reports that she does not drink alcohol or use drugs.    Review of Systems:  Gen:  Denies  fever, sweats, chills weigh loss  HEENT: Denies blurred vision, double vision, ear pain, eye pain, hearing loss, nose bleeds, sore throat Cardiac:  No dizziness, chest pain or heaviness, chest tightness,edema, No JVD Resp:   No cough, -sputum production, -shortness of breath,-wheezing, -hemoptysis,  Gi: Denies swallowing difficulty, stomach pain, nausea or vomiting, diarrhea, constipation, bowel incontinence Gu:  Denies bladder incontinence, burning urine Ext:    + swelling Skin: Denies  skin rash, easy bruising or bleeding or hives Endoc:  Denies polyuria, polydipsia , polyphagia or weight change Psych:   Denies depression, insomnia or hallucinations  Other:  All other systems negative    BP 122/68 (BP Location: Left Arm, Cuff Size: Normal)   Pulse 67   Temp 98.2 F (36.8 C) (Temporal)   Ht 5\' 4"  (1.626 m)   Wt 198 lb 6.4 oz (90 kg)   BMI 34.06 kg/m         Physical Examination:   GENERAL:NAD, no fevers, chills, no weakness no fatigue HEAD: Normocephalic, atraumatic.  EYES: PERLA, EOMI No scleral icterus.  MOUTH: Moist mucosal membrane.  EAR, NOSE, THROAT: Clear without exudates. No external lesions.  NECK: Supple.  PULMONARY: CTA B/L no wheezing, rhonchi, crackles CARDIOVASCULAR: S1 and S2. Regular rate and rhythm. No murmurs GASTROINTESTINAL: Soft, nontender, nondistended. Positive bowel sounds.  MUSCULOSKELETAL: + edema.  NEUROLOGIC: No gross focal neurological deficits. 5/5 strength all extremities SKIN: No ulceration, lesions, rashes, or cyanosis.  PSYCHIATRIC: Insight, judgment intact. -depression -anxiety ALL OTHER ROS ARE NEGATIVE   MEDICATIONS: I have reviewed all medications and confirmed regimen as documented  ASSESSMENT AND PLAN SYNOPSIS  57 year old pleasant white female seen today for progressive shortness of breath associated with dizzy spells that is been going on for the last 6 months in the setting of abnormal echocardiogram at outpatient cardiology office with elevated pulmonary artery pressures in the setting of former smoking history with underlying probability of sleep apnea  At this time patient will need further and extensive work-up to assess for pulmonary hypertension  She will need CT of her chest to assess for pulmonary embolism She will need pulmonary function testing test test for underlying COPD She will need 6-minute walk test to assess for exertional hypoxia with her dizziness She  will need overnight pulse oximetry to assess for nocturnal hypoxia  Based on her Epworth score and history of excessive daytime sleepiness she will need sleep study to assess for underlying sleep apnea  At this time I would recommend starting Spiriva Respimat therapy 1.25 and assess her respiratory status  I have advised her to follow-up once all these tests have been completed If all these tests are nondiagnostic I would recommend obtaining HIV hepatitis C and then she will need a repeat echocardiogram with left and right heart catheterization COVID-19 EDUCATION: The signs and symptoms of COVID-19 were discussed with the patient and how to seek care for testing.  The importance of social distancing was discussed today. Hand Washing Techniques and avoid touching face was advised.  MEDICATION ADJUSTMENTS/LABS AND TESTS ORDERED: CT chest assess for PE  Obtain PFT's Obtain 6MWT Check ONO Obtain SLeep STudy  STart Spiriva Respimat 1.25   CURRENT MEDICATIONS REVIEWED AT LENGTH WITH PATIENT TODAY   Patient satisfied with Plan of action and management. All questions answered  Follow up in 3 months   Freeland Pracht Patricia Pesa, M.D.  [facility]  Medical Director [place] Director [facility] Cardio-Pulmonary Department

## 2019-06-07 ENCOUNTER — Ambulatory Visit
Admission: RE | Admit: 2019-06-07 | Discharge: 2019-06-07 | Disposition: A | Discharge status: Home / Self Care | Payer: 59 | Source: Ambulatory Visit | Attending: Internal Medicine | Admitting: Internal Medicine

## 2019-06-07 ENCOUNTER — Other Ambulatory Visit: Payer: Self-pay

## 2019-06-07 DIAGNOSIS — R0602 Shortness of breath: Secondary | ICD-10-CM | POA: Insufficient documentation

## 2019-06-07 LAB — POCT I-STAT CREATININE: Creatinine, Ser: 1.1 mg/dL — ABNORMAL HIGH (ref 0.44–1.00)

## 2019-06-07 MED ORDER — IOHEXOL 350 MG/ML SOLN
75.0000 mL | Freq: Once | INTRAVENOUS | Status: AC | PRN
  Administered 2019-06-07: 75 mL via INTRAVENOUS

## 2019-06-14 ENCOUNTER — Telehealth: Payer: Self-pay | Admitting: Internal Medicine

## 2019-06-14 ENCOUNTER — Encounter: Payer: Self-pay | Admitting: Internal Medicine

## 2019-06-14 ENCOUNTER — Ambulatory Visit: Payer: 59

## 2019-06-14 NOTE — Telephone Encounter (Signed)
SMW has been rescheduled for 06/29/2019. Pt aware and voiced her understanding. Nothing further is needed.

## 2019-06-20 ENCOUNTER — Telehealth: Payer: Self-pay | Admitting: Internal Medicine

## 2019-06-20 DIAGNOSIS — J449 Chronic obstructive pulmonary disease, unspecified: Secondary | ICD-10-CM

## 2019-06-20 NOTE — Telephone Encounter (Signed)
ONO reviewed by Dr. Jackie Plum 1L QHS. 19.7 Min <88% Left message for pt to relay results.

## 2019-06-21 NOTE — Telephone Encounter (Signed)
Called and spoke to pt and relayed below results.  Pt wished to proceed with night time oxygen. DK please advise on dx for oxygen.

## 2019-06-21 NOTE — Telephone Encounter (Signed)
Please proceed 2 l [place] at night

## 2019-06-21 NOTE — Telephone Encounter (Signed)
Lm for pt

## 2019-06-22 NOTE — Telephone Encounter (Signed)
Findings suggest COPD

## 2019-06-22 NOTE — Telephone Encounter (Signed)
Order has been placed for night time oxygen. Nothing further is needed.

## 2019-06-22 NOTE — Telephone Encounter (Signed)
What diagnosis would you like to use for night time oxygen?

## 2019-06-29 ENCOUNTER — Ambulatory Visit: Payer: 59

## 2019-07-01 ENCOUNTER — Ambulatory Visit (INDEPENDENT_AMBULATORY_CARE_PROVIDER_SITE_OTHER): Payer: 59

## 2019-07-01 ENCOUNTER — Other Ambulatory Visit: Payer: Self-pay

## 2019-07-01 DIAGNOSIS — R0609 Other forms of dyspnea: Secondary | ICD-10-CM

## 2019-07-01 DIAGNOSIS — R0600 Dyspnea, unspecified: Secondary | ICD-10-CM | POA: Diagnosis not present

## 2019-07-12 ENCOUNTER — Ambulatory Visit: Payer: 59

## 2019-07-12 ENCOUNTER — Other Ambulatory Visit: Payer: Self-pay

## 2019-07-20 ENCOUNTER — Telehealth: Payer: Self-pay | Admitting: Internal Medicine

## 2019-07-21 NOTE — Telephone Encounter (Signed)
LMAM for this pt to call back to schedule

## 2019-07-25 NOTE — Telephone Encounter (Signed)
Pt is scheduled for 08/01/19

## 2019-08-01 ENCOUNTER — Ambulatory Visit: Payer: 59

## 2019-08-01 ENCOUNTER — Other Ambulatory Visit: Payer: Self-pay

## 2019-08-01 DIAGNOSIS — G4733 Obstructive sleep apnea (adult) (pediatric): Secondary | ICD-10-CM

## 2019-08-02 ENCOUNTER — Other Ambulatory Visit: Payer: Self-pay

## 2019-08-02 ENCOUNTER — Encounter: Payer: Self-pay | Admitting: Emergency Medicine

## 2019-08-02 ENCOUNTER — Emergency Department
Admission: EM | Admit: 2019-08-02 | Discharge: 2019-08-02 | Disposition: A | Discharge status: Home / Self Care | Payer: 59 | Attending: Emergency Medicine | Admitting: Emergency Medicine

## 2019-08-02 ENCOUNTER — Emergency Department: Payer: 59

## 2019-08-02 DIAGNOSIS — I10 Essential (primary) hypertension: Secondary | ICD-10-CM | POA: Insufficient documentation

## 2019-08-02 DIAGNOSIS — Z79899 Other long term (current) drug therapy: Secondary | ICD-10-CM | POA: Insufficient documentation

## 2019-08-02 DIAGNOSIS — Z7902 Long term (current) use of antithrombotics/antiplatelets: Secondary | ICD-10-CM | POA: Insufficient documentation

## 2019-08-02 DIAGNOSIS — Z7982 Long term (current) use of aspirin: Secondary | ICD-10-CM | POA: Insufficient documentation

## 2019-08-02 DIAGNOSIS — R2242 Localized swelling, mass and lump, left lower limb: Secondary | ICD-10-CM | POA: Diagnosis present

## 2019-08-02 DIAGNOSIS — I259 Chronic ischemic heart disease, unspecified: Secondary | ICD-10-CM | POA: Insufficient documentation

## 2019-08-02 DIAGNOSIS — Z87891 Personal history of nicotine dependence: Secondary | ICD-10-CM | POA: Insufficient documentation

## 2019-08-02 DIAGNOSIS — M7122 Synovial cyst of popliteal space [Baker], left knee: Secondary | ICD-10-CM | POA: Diagnosis not present

## 2019-08-02 LAB — BASIC METABOLIC PANEL
Anion gap: 6 (ref 5–15)
BUN: 21 mg/dL — ABNORMAL HIGH (ref 6–20)
CO2: 26 mmol/L (ref 22–32)
Calcium: 9.2 mg/dL (ref 8.9–10.3)
Chloride: 106 mmol/L (ref 98–111)
Creatinine, Ser: 0.81 mg/dL (ref 0.44–1.00)
GFR calc Af Amer: >60 mL/min (ref >60)
GFR calc non Af Amer: >60 mL/min (ref >60)
Glucose, Bld: 105 mg/dL — ABNORMAL HIGH (ref 70–99)
Potassium: 3.4 mmol/L — ABNORMAL LOW (ref 3.5–5.1)
Sodium: 138 mmol/L (ref 135–145)

## 2019-08-02 LAB — CBC
HCT: 35.9 % — ABNORMAL LOW (ref 36.0–46.0)
Hemoglobin: 11.5 g/dL — ABNORMAL LOW (ref 12.0–15.0)
MCH: 27.3 pg (ref 26.0–34.0)
MCHC: 32 g/dL (ref 30.0–36.0)
MCV: 85.1 fL (ref 80.0–100.0)
Platelets: 275 10*3/uL (ref 150–400)
RBC: 4.22 MIL/uL (ref 3.87–5.11)
RDW: 13.6 % (ref 11.5–15.5)
WBC: 6.7 10*3/uL (ref 4.0–10.5)
nRBC: 0 % (ref 0.0–0.2)

## 2019-08-02 MED ORDER — NAPROXEN 500 MG PO TABS: 500.0000 mg | ORAL_TABLET | Freq: Two times a day (BID) | ORAL | 2 refills | Status: DC

## 2019-08-02 MED ORDER — HYDROCODONE-ACETAMINOPHEN 5-325 MG PO TABS
1.0000 | ORAL_TABLET | Freq: Once | ORAL | Status: AC
  Administered 2019-08-02: 1 via ORAL
  Filled 2019-08-02: qty 1

## 2019-08-02 MED ORDER — HYDROCODONE-ACETAMINOPHEN 5-325 MG PO TABS: 1.0000 | ORAL_TABLET | ORAL | 0 refills | Status: DC | PRN

## 2019-08-02 NOTE — ED Triage Notes (Addendum)
Pt c/o LFT lower leg swelling x2wks with worsening pain. No redness noted. Denies any acute injury, PT takes plavix for previous stent. PT denies SOB. Slight swelling noted, skin cool to touch

## 2019-08-02 NOTE — ED Notes (Signed)
Spoke with MD Paduchowski , see orders

## 2019-08-02 NOTE — ED Provider Notes (Signed)
[facility] Emergency Department Provider Note   ____________________________________________    I have reviewed the triage vital signs and the nursing notes.   HISTORY  Chief Complaint Left leg pain and swelling    HPI Christina Zamora is a 58 y.o. female with history as noted below who presents with complaints of left calf pain for approximately 2 weeks.  She reports it is gradually worsened and became significantly worse today.  She denies recent travel.  Did have a fall about a month ago.  No history of peripheral arterial disease.  No redness or concerns for infection.  No fevers or chills.  Has not taken anything for this.  No history of blood clots  Past Medical History:  Diagnosis Date  . Arthritis   . CAD (coronary artery disease)   . Chronic headaches   . GERD (gastroesophageal reflux disease)   . Hyperlipidemia   . Hypertension   . Insomnia   . Lumbago   . Pulmonary HTN ([place])   . Thyroid disease   . Vitamin D deficiency     Patient Active Problem List   Diagnosis Date Noted  . CAD (coronary artery disease) 05/29/2017  . Chest pain 08/25/2015    Past Surgical History:  Procedure Laterality Date  . BILATERAL CARPAL TUNNEL RELEASE  2000  . BREAST EXCISIONAL BIOPSY Right 06/24/2010   surgical bx fat necrosis  . CORONARY ANGIOGRAPHY N/A 05/29/2017   Procedure: CORONARY ANGIOGRAPHY;  Surgeon: Dionisio David, MD;  Location: [place] [facility];  Service: Cardiovascular;  Laterality: N/A;  . CORONARY ANGIOPLASTY WITH STENT PLACEMENT    . LEFT HEART CATH Left 05/29/2017   Procedure: Left Heart Cath;  Surgeon: Dionisio David, MD;  Location: [place] [facility];  Service: Cardiovascular;  Laterality: Left;  . REPLACEMENT TOTAL KNEE Left 2015    Prior to Admission medications   Medication Sig Start Date End Date Taking? Authorizing Provider  amLODipine (NORVASC) 10 MG tablet Take 10 mg by mouth daily.    [provider]   aspirin EC 81 MG tablet Take 81 mg by mouth daily.     [provider]  atorvastatin (LIPITOR) 80 MG tablet Take 80 mg by mouth daily.    [provider]  buPROPion (WELLBUTRIN SR) 150 MG 12 hr tablet Take 150 mg by mouth 2 (two) times daily.     [provider]  celecoxib (CELEBREX) 200 MG capsule Take 200 mg by mouth every morning.    [provider]  clopidogrel (PLAVIX) 75 MG tablet Take 75 mg by mouth daily.    [provider]  ezetimibe (ZETIA) 10 MG tablet Take 10 mg by mouth daily.    [provider]  hydrochlorothiazide (HYDRODIURIL) 25 MG tablet Take 1 tablet by mouth daily. 05/12/17   [provider]  HYDROcodone-acetaminophen (NORCO/VICODIN) 5-325 MG tablet Take 1 tablet by mouth every 4 (four) hours as needed for moderate pain. 08/02/19   Lavonia Drafts, MD  L-Lysine 500 MG CAPS Take 1 capsule by mouth daily.    [provider]  levothyroxine (SYNTHROID, LEVOTHROID) 200 MCG tablet Take 200 mcg by mouth daily before breakfast.     [provider]  loratadine (CLARITIN) 10 MG tablet Take 10 mg by mouth daily.    [provider]  Multiple Vitamin (MULTIVITAMIN) tablet Take 1 tablet by mouth daily.    [provider]  naproxen (NAPROSYN) 500 MG tablet Take 1 tablet (  500 mg total) by mouth 2 (two) times daily with a meal. 08/02/19   Lavonia Drafts, MD  nitroGLYCERIN (NITROSTAT) 0.4 MG SL tablet Place 1 tablet (0.4 mg total) under the tongue every 5 (five) minutes as needed for chest pain. 08/26/15   Gouru, Illene Silver, MD  Omega-3 Fatty Acids (FISH OIL) 1000 MG CAPS Take 1 capsule by mouth daily.    [provider]  pantoprazole (PROTONIX) 40 MG tablet Take 40 mg by mouth daily as needed.     [provider]  ramipril (ALTACE) 10 MG capsule Take 10 mg by mouth daily.     [provider]  Tiotropium Bromide Monohydrate (SPIRIVA RESPIMAT) 1.25 MCG/ACT AERS Inhale 2 puffs into  the lungs 2 (two) times daily. 06/03/19   Flora Lipps, MD     Allergies Hydralazine hcl  Family History  Problem Relation Age of Onset  . Heart failure Mother   . Diabetes Mother   . Hyperlipidemia Mother   . Heart attack Father     Social History Social History   Tobacco Use  . Smoking status: Former Smoker    Packs/day: 0.50    Years: 20.00    Pack years: 10.00    Quit date: 2002    Years since quitting: 18.8  . Smokeless tobacco: Never Used  Substance Use Topics  . Alcohol use: No    Alcohol/week: 0.0 standard drinks  . Drug use: No    Review of Systems  Constitutional: No fever/chills  Cardiovascular: Denies chest pain. Respiratory: Denies shortness of breath. Gastrointestinal: No abdominal pain.    Musculoskeletal: As above Skin: Negative for rash. Neurological: Negative for numbness   ____________________________________________   PHYSICAL EXAM:  VITAL SIGNS: ED Triage Vitals  Enc Vitals Group     BP 08/02/19 1756 133/67     Pulse Rate 08/02/19 1756 73     Resp 08/02/19 1756 16     Temp 08/02/19 1756 97.8 F (36.6 C)     Temp Source 08/02/19 1756 Oral     SpO2 08/02/19 1756 100 %     Weight --      Height --      Head Circumference --      Peak Flow --      Pain Score 08/02/19 1752 8     Pain Loc --      Pain Edu? --      Excl. in [place]? --     Constitutional: Alert and oriented.e  Nose: No congestion/rhinnorhea. Mouth/Throat: Mucous membranes are moist.    Cardiovascular: Normal rate, regular rhythm. Grossly normal heart sounds.  Good peripheral circulation. Respiratory: Normal respiratory effort.  No retractions. Lungs CTAB. Gastrointestinal: Soft and nontender. No distention.   Musculoskeletal: Patient with mild swelling to the left lower leg, some tenderness to the calf, normal pulses distally Neurologic:  Normal speech and language. No gross focal neurologic deficits are appreciated.  Skin:  Skin is warm, dry and intact. No rash  noted. Psychiatric: Mood and affect are normal. Speech and behavior are normal.  ____________________________________________   LABS (all labs ordered are listed, but only abnormal results are displayed)  Labs Reviewed  CBC - Abnormal; Notable for the following components:      Result Value   Hemoglobin 11.5 (*)    HCT 35.9 (*)    All other components within normal limits  BASIC METABOLIC PANEL - Abnormal; Notable for the following components:   Potassium 3.4 (*)    Glucose, Bld  105 (*)    BUN 21 (*)    All other components within normal limits   ____________________________________________  EKG  None ____________________________________________  RADIOLOGY  Ultrasound negative for DVT, positive for likely Baker's cyst ____________________________________________   PROCEDURES  Procedure(s) performed: No  Procedures   Critical Care performed: No ____________________________________________   INITIAL IMPRESSION / ASSESSMENT AND PLAN / ED COURSE  Pertinent labs & imaging results that were available during my care of the patient were reviewed by me and considered in my medical decision making (see chart for details).  Patient's ultrasound is consistent with Baker's cyst, this is likely the cause of her pain, likely no evidence of DVT.  She was treated with Vicodin for pain in the emergency department.  Lab work is overall reassuring.  Appropriate for discharge with outpatient follow-up with Ortho    ____________________________________________   FINAL CLINICAL IMPRESSION(S) / ED DIAGNOSES  Final diagnoses:  Synovial cyst of left popliteal space        Note:  This document was prepared using Dragon voice recognition software and may include unintentional dictation errors.   Lavonia Drafts, MD 08/02/19 1946

## 2019-08-09 ENCOUNTER — Other Ambulatory Visit: Payer: Self-pay | Admitting: Orthopedic Surgery

## 2019-08-09 ENCOUNTER — Telehealth: Payer: Self-pay | Admitting: Pulmonary Disease

## 2019-08-09 DIAGNOSIS — M79605 Pain in left leg: Secondary | ICD-10-CM

## 2019-08-09 DIAGNOSIS — G4733 Obstructive sleep apnea (adult) (pediatric): Secondary | ICD-10-CM | POA: Diagnosis not present

## 2019-08-09 DIAGNOSIS — M7122 Synovial cyst of popliteal space [Baker], left knee: Secondary | ICD-10-CM

## 2019-08-09 NOTE — Telephone Encounter (Signed)
Home sleep study 08/01/19 >> AHI 12, SpO2 low 71%.  Please inform her that her sleep study shows mild obstructive sleep apnea.  Please arrange for ROV with Dr. Mortimer Fries or NP to discuss treatment options.

## 2019-08-09 NOTE — Telephone Encounter (Signed)
Left message for pt

## 2019-08-10 NOTE — Telephone Encounter (Signed)
Left message for pt

## 2019-08-12 NOTE — Telephone Encounter (Signed)
Left message for pt

## 2019-08-15 ENCOUNTER — Ambulatory Visit
Admission: RE | Admit: 2019-08-15 | Discharge: 2019-08-15 | Disposition: A | Discharge status: Home / Self Care | Payer: 59 | Source: Ambulatory Visit | Attending: Orthopedic Surgery | Admitting: Orthopedic Surgery

## 2019-08-15 ENCOUNTER — Other Ambulatory Visit: Payer: Self-pay

## 2019-08-15 DIAGNOSIS — M79605 Pain in left leg: Secondary | ICD-10-CM | POA: Diagnosis not present

## 2019-08-15 DIAGNOSIS — M7122 Synovial cyst of popliteal space [Baker], left knee: Secondary | ICD-10-CM | POA: Insufficient documentation

## 2019-08-15 MED ORDER — METHYLPREDNISOLONE ACETATE 80 MG/ML IJ SUSP
INTRAMUSCULAR | Status: AC
  Filled 2019-08-15: qty 2

## 2019-08-15 NOTE — Procedures (Signed)
Pre procedural Dx: Symptomatic Baker's Cyst/Left calf fluid collection Post procedural Dx: Same  Technically successful ultrasound-guided aspiration of approximately 2 cc bloody fluid from the complex fluid collection within the musculature of the left calf.    As the fluid collection has significantly decreased in size compared to the 08/02/2019 examination and is favored to represent and evolving hematoma, steroid injection was not performed.     EBL: None Complications: None immediate  Ronny Bacon, MD Pager #: [phone]

## 2019-08-15 NOTE — Discharge Instructions (Signed)
Baker Cyst A Baker cyst, also called a popliteal cyst, is a sac-like growth that forms at the back of the knee. The cyst forms when the fluid-filled sac (bursa) that cushions the knee joint becomes enlarged. The bursa that becomes a Baker cyst is located at the back of the knee joint. What are the causes? In most cases, a Baker cyst results from another knee problem that causes swelling inside the knee. This makes the fluid inside the knee joint (synovial fluid) flow into the bursa behind the knee, causing the bursa to enlarge. What increases the risk? You may be more likely to develop a Baker cyst if you already have a knee problem, such as:  A tear in cartilage that cushions the knee joint (meniscal tear).  A tear in the tissues that connect the bones of the knee joint (ligament tear).  Knee swelling from osteoarthritis, rheumatoid arthritis, or gout. What are the signs or symptoms? A Baker cyst does not always cause symptoms. A lump behind the knee may be the only sign of the condition. The lump may be painful, especially when the knee is straightened. If the lump is painful, the pain may come and go. The knee may also be stiff. Symptoms may quickly get more severe if the cyst breaks open (ruptures). If your cyst ruptures, signs and symptoms may affect the knee and the back of the lower leg (calf) and may include:  Sudden or worsening pain.  Swelling.  Bruising. How is this diagnosed? This condition may be diagnosed based on your symptoms and medical history. Your health care provider will also do a physical exam. This may include:  Feeling the cyst to check whether it is tender.  Checking your knee for signs of another knee condition that causes swelling. You may have imaging tests, such as:  X-rays.  MRI.  Ultrasound. How is this treated? A Baker cyst that is not painful may go away without treatment. If the cyst gets large or painful, it will likely get better if the  underlying knee problem is treated. Treatment for a Baker cyst may include:  Resting.  Keeping weight off of the knee. This means not leaning on the knee to support your body weight.  NSAIDs to reduce pain and swelling.  A procedure to drain the fluid from the cyst with a needle (aspiration). You may also get an injection of a medicine that reduces swelling (steroid).  Surgery. This may be needed if other treatments do not work. This usually involves correcting knee damage and removing the cyst. Follow these instructions at home:   Take over-the-counter and prescription medicines only as told by your health care provider.  Rest and return to your normal activities as told by your health care provider. Avoid activities that make pain or swelling worse. Ask your health care provider what activities are safe for you.  Keep all follow-up visits as told by your health care provider. This is important. Contact a health care provider if:  You have knee pain, stiffness, or swelling that does not get better. Get help right away if:  You have sudden or worsening pain and swelling in your calf area. This information is not intended to replace advice given to you by your health care provider. Make sure you discuss any questions you have with your health care provider. Document Released: 09/08/2005 Document Revised: 08/21/2017 Document Reviewed: 05/29/2016 Elsevier Patient Education  2020 [employer].

## 2019-08-17 NOTE — Telephone Encounter (Signed)
Pt is aware of results and voiced her understanding.  Pt has scheduled ov with Dr. Mortimer Fries on 09/02/2019. Offered sooner appt with NP.  Pt wished to keep scheduled OV. Nothing further is needed.

## 2019-08-19 ENCOUNTER — Other Ambulatory Visit: Payer: Self-pay

## 2019-08-19 ENCOUNTER — Telehealth: Payer: Self-pay | Admitting: Pulmonary Disease

## 2019-08-19 NOTE — Telephone Encounter (Signed)
Left message to relay date/time of covid test. 08/22/2019 [facility] to [facility]

## 2019-08-19 NOTE — Telephone Encounter (Signed)
Pt is aware of date/time of covid test and voiced her understanding.  Nothing further is needed.  

## 2019-08-22 ENCOUNTER — Other Ambulatory Visit: Payer: Self-pay

## 2019-08-22 ENCOUNTER — Other Ambulatory Visit
Admission: RE | Admit: 2019-08-22 | Discharge: 2019-08-22 | Disposition: A | Discharge status: Home / Self Care | Payer: 59 | Source: Ambulatory Visit | Attending: Internal Medicine | Admitting: Internal Medicine

## 2019-08-22 DIAGNOSIS — Z20828 Contact with and (suspected) exposure to other viral communicable diseases: Secondary | ICD-10-CM | POA: Diagnosis not present

## 2019-08-22 DIAGNOSIS — Z01812 Encounter for preprocedural laboratory examination: Secondary | ICD-10-CM | POA: Insufficient documentation

## 2019-08-22 LAB — SARS CORONAVIRUS 2 (TAT 6-24 HRS): SARS Coronavirus 2: NEGATIVE

## 2019-08-23 ENCOUNTER — Ambulatory Visit: Payer: 59 | Attending: Internal Medicine

## 2019-08-23 DIAGNOSIS — R0602 Shortness of breath: Secondary | ICD-10-CM | POA: Diagnosis not present

## 2019-08-23 MED ORDER — ALBUTEROL SULFATE (2.5 MG/3ML) 0.083% IN NEBU
2.5000 mg | INHALATION_SOLUTION | Freq: Once | RESPIRATORY_TRACT | Status: AC
  Administered 2019-08-23: 09:00:00 2.5 mg via RESPIRATORY_TRACT
  Filled 2019-08-23: qty 3

## 2019-08-30 ENCOUNTER — Encounter: Payer: Self-pay | Admitting: Internal Medicine

## 2019-08-30 ENCOUNTER — Ambulatory Visit (INDEPENDENT_AMBULATORY_CARE_PROVIDER_SITE_OTHER): Payer: 59 | Admitting: Internal Medicine

## 2019-08-30 ENCOUNTER — Other Ambulatory Visit: Payer: Self-pay

## 2019-08-30 DIAGNOSIS — G4733 Obstructive sleep apnea (adult) (pediatric): Secondary | ICD-10-CM | POA: Diagnosis not present

## 2019-08-30 DIAGNOSIS — R0602 Shortness of breath: Secondary | ICD-10-CM | POA: Diagnosis not present

## 2019-08-30 DIAGNOSIS — R42 Dizziness and giddiness: Secondary | ICD-10-CM | POA: Diagnosis not present

## 2019-08-30 NOTE — Progress Notes (Signed)
Name: Christina Zamora MRN: [id number] DOB: 05-09-1961     I connected with the patient by telephone enabled telemedicine visit and verified that I am speaking with the correct person using two identifiers.    I discussed the limitations, risks, security and privacy concerns of performing an evaluation and management service by telemedicine and the availability of in-person appointments. I also discussed with the patient that there may be a patient responsible charge related to this service. The patient expressed understanding and agreed to proceed.  PATIENT AGREES AND CONFIRMS -YES   Other persons participating in the visit and their role in the encounter: Patient, nursing  This visit type was conducted due to national recommendations for restrictions regarding the COVID-19 Pandemic (e.g. social distancing).  This format is felt to be most appropriate for this patient at this time.  All issues noted in this document were discussed and addressed.       OV date 08/30/2019  REFERRING MD : Diona Browner   SYNOPSIS  progressive shortness of breath, Associated with the dizzy spells  Has some increased shortness of breath and dyspnea on exertion over the last 6 months Patient had an echocardiogram performed at her cardiologist office in the last 8 months which is consistent with elevated pulmonary artery pressures Patient has significant history of cardiac disease with stent She stent placed  at the age of 63 She has increased breathlessness Increased dyspnea on exertion with 20 feet She is a former smoker Quit 20 years ago 2 pack a day for 30 years Patient underwent gastric bypass She lost about 100 pounds Patient does have a previous history of sleep apnea Has been prescribed CPAP in the past   STUDIES  **CT chest 9/20 The CT was Independently Reviewed By Me Today CT reviewed- NO PE, mild lower lobe scarring, No significant ILD  **PFT 12/20 Moderate restrictive lung disease No  obstruction  **[place] LE 11/20 NO DVT  **HST 11/20 AHI 12 85 desat episodes Lowest o2 sat 71%    CC  follow up SOB  HPI  No evidence of heart failure at this time No evidence or signs of infection at this time No respiratory distress No fevers, chills, nausea, vomiting, diarrhea No evidence of lower extremity edema No evidence hemoptysis  Results reviewed with patient Weighs 200 pounds  AHI 12 reviewed with patient   PAST MEDICAL HISTORY :   has a past medical history of Arthritis, CAD (coronary artery disease), Chronic headaches, GERD (gastroesophageal reflux disease), Hyperlipidemia, Hypertension, Insomnia, Lumbago, Pulmonary HTN ([place]), Thyroid disease, and Vitamin D deficiency.  has a past surgical history that includes Coronary angioplasty with stent; Bilateral carpal tunnel release (2000); Replacement total knee (Left, 2015); Left Heart Cath (Left, 05/29/2017); CORONARY ANGIOGRAPHY (N/A, 05/29/2017); and Breast excisional biopsy (Right, 06/24/2010). Prior to Admission medications   Medication Sig Start Date End Date Taking? Authorizing Provider  amLODipine (NORVASC) 10 MG tablet Take 10 mg by mouth daily.   Yes [provider]  aspirin EC 81 MG tablet Take 81 mg by mouth daily.    Yes [provider]  atorvastatin (LIPITOR) 80 MG tablet Take 80 mg by mouth daily.   Yes [provider]  buPROPion (WELLBUTRIN SR) 150 MG 12 hr tablet Take 150 mg by mouth 2 (two) times daily.    Yes [provider]  buPROPion (ZYBAN) 150 MG 12 hr tablet Take 150 mg by mouth 2 (two) times daily. 07/17/17  Yes [provider]  celecoxib (  CELEBREX) 200 MG capsule Take 200 mg by mouth every morning.   Yes [provider]  clopidogrel (PLAVIX) 75 MG tablet Take 75 mg by mouth daily.   Yes [provider]  cyclobenzaprine (FLEXERIL) 10 MG tablet TAKE 1 TABLET ORALLY NIGHTLY AT BEDTIME FOR MUSCLE RELAXANT, WILL MAKE DROWSY 09/09/17  Yes  [provider]  ezetimibe (ZETIA) 10 MG tablet Take 10 mg by mouth daily.   Yes [provider]  hydrochlorothiazide (HYDRODIURIL) 25 MG tablet Take 1 tablet by mouth daily. 05/12/17  Yes [provider]  L-Lysine 500 MG CAPS Take 1 capsule by mouth daily.   Yes [provider]  levothyroxine (SYNTHROID, LEVOTHROID) 200 MCG tablet Take 200 mcg by mouth daily before breakfast.    Yes [provider]  loratadine (CLARITIN) 10 MG tablet Take 10 mg by mouth daily.   Yes [provider]  Multiple Vitamin (MULTIVITAMIN) tablet Take 1 tablet by mouth daily.   Yes [provider]  nitroGLYCERIN (NITROSTAT) 0.4 MG SL tablet Place 1 tablet (0.4 mg total) under the tongue every 5 (five) minutes as needed for chest pain. 08/26/15  Yes Gouru, Illene Silver, MD  Omega-3 Fatty Acids (FISH OIL) 1000 MG CAPS Take 1 capsule by mouth daily.   Yes [provider]  pantoprazole (PROTONIX) 40 MG tablet Take 40 mg by mouth daily as needed.    Yes [provider]  ramipril (ALTACE) 10 MG capsule Take 10 mg by mouth daily.    Yes [provider]  traMADol (ULTRAM) 50 MG tablet Take 2 tablets (100 mg total) by mouth every 6 (six) hours as needed for moderate pain. 07/24/17  Yes Molli Barrows, MD   Allergies  Allergen Reactions  . Hydralazine Hcl Swelling and Palpitations    FAMILY HISTORY:  family history includes Diabetes in her mother; Heart attack in her father; Heart failure in her mother; Hyperlipidemia in her mother. SOCIAL HISTORY:  reports that she quit smoking about 18 years ago. She has a 10.00 pack-year smoking history. She has never used smokeless tobacco. She reports that she does not drink alcohol or use drugs.     Review of Systems:  Gen:  Denies  fever, sweats, chills weight loss  HEENT: Denies blurred vision, double vision, ear pain, eye pain, hearing loss, nose bleeds, sore throat Cardiac:  No dizziness, chest pain  or heaviness, chest tightness,edema, No JVD Resp:   No cough, -sputum production, -shortness of breath,-wheezing, -hemoptysis,  Gi: Denies swallowing difficulty, stomach pain, nausea or vomiting, diarrhea, constipation, bowel incontinence Gu:  Denies bladder incontinence, burning urine Ext:   Denies Joint pain, stiffness or swelling Skin: Denies  skin rash, easy bruising or bleeding or hives Endoc:  Denies polyuria, polydipsia , polyphagia or weight change Psych:   Denies depression, insomnia or hallucinations  Other:  All other systems negative    There were no vitals taken for this visit.     MEDICATIONS: I have reviewed all medications and confirmed regimen as documented      ASSESSMENT AND PLAN SYNOPSIS  After further assessment, alll work up thyat was ordered and completed reveals OSA based on HST AHI 12 Recommend starting AUTO CPAP 5-15 cm h20  Obesity -recommend significant weight loss -recommend changing diet  Deconditioned state -Recommend increased daily activity and exercise    COVID-19 EDUCATION: The signs and symptoms of COVID-19 were discussed with the patient and how to seek care for testing.  The importance of social  distancing was discussed today. Hand Washing Techniques and avoid touching face was advised.  MEDICATION ADJUSTMENTS/LABS AND TESTS ORDERED: Recommend auto-CPAP 5-15 cm h20    CURRENT MEDICATIONS REVIEWED AT LENGTH WITH PATIENT TODAY   Patient satisfied with Plan of action and management. All questions answered  Follow up in 3 months Total Time Spent 21 mins  Maretta Bees Patricia Pesa, M.D.  [facility]  Medical Director [place] Director [facility] Cardio-Pulmonary Department

## 2019-08-30 NOTE — Patient Instructions (Signed)
MEDICATION ADJUSTMENTS/LABS AND TESTS ORDERED: Recommend autoCPAP 5-15 cm h20

## 2019-08-30 NOTE — Addendum Note (Signed)
Addended by: Maryanna Shape A on: 08/30/2019 09:22 AM   Modules accepted: Orders

## 2019-09-02 ENCOUNTER — Ambulatory Visit: Payer: 59 | Admitting: Internal Medicine

## 2019-11-21 ENCOUNTER — Ambulatory Visit: Payer: 59 | Attending: Internal Medicine

## 2019-11-21 DIAGNOSIS — Z23 Encounter for immunization: Secondary | ICD-10-CM

## 2019-11-21 NOTE — Progress Notes (Signed)
   Covid-19 Vaccination Clinic  Name:  Christina Zamora    MRN: [id number] DOB: 04-19-1961  11/21/2019  [name] was observed post Covid-19 immunization for 15 minutes without incidence. She was provided with Vaccine Information Sheet and instruction to access the V-Safe system.   [name] was instructed to call 911 with any severe reactions post vaccine: Marland Kitchen Difficulty breathing  . Swelling of your face and throat  . A fast heartbeat  . A bad rash all over your body  . Dizziness and weakness    Immunizations Administered    Name Date Dose VIS Date Route   Pfizer COVID-19 Vaccine 11/21/2019  1:43 PM 0.3 mL 09/02/2019 Intramuscular   Manufacturer: [place]   Lot: [id number]   [place]: [id number]

## 2019-12-14 ENCOUNTER — Ambulatory Visit: Payer: Self-pay

## 2019-12-26 ENCOUNTER — Ambulatory Visit: Payer: 59 | Attending: Internal Medicine

## 2019-12-26 DIAGNOSIS — Z23 Encounter for immunization: Secondary | ICD-10-CM

## 2019-12-26 NOTE — Progress Notes (Signed)
   Covid-19 Vaccination Clinic  Name:  Christina Zamora    MRN: [id number] DOB: [birth year]  12/26/2019  [name] was observed post Covid-19 immunization for 15 minutes without incident. She was provided with Vaccine Information Sheet and instruction to access the V-Safe system.   [name] was instructed to call 911 with any severe reactions post vaccine: Marland Kitchen Difficulty breathing  . Swelling of face and throat  . A fast heartbeat  . A bad rash all over body  . Dizziness and weakness   Immunizations Administered    Name Date Dose VIS Date Route   Pfizer COVID-19 Vaccine 12/26/2019  8:45 AM 0.3 mL 09/02/2019 Intramuscular   Manufacturer: [place]   Lot: [id number]   [place]: [id number]

## 2019-12-28 ENCOUNTER — Ambulatory Visit: Payer: Self-pay

## 2020-01-23 ENCOUNTER — Ambulatory Visit: Payer: Self-pay | Admitting: Cardiovascular Disease

## 2020-01-23 DIAGNOSIS — I200 Unstable angina: Secondary | ICD-10-CM | POA: Insufficient documentation

## 2020-01-23 MED ORDER — SODIUM CHLORIDE 0.9% FLUSH
3.0000 mL | Freq: Two times a day (BID) | INTRAVENOUS | Status: AC
  Filled 2020-01-23: qty 3

## 2020-01-25 ENCOUNTER — Other Ambulatory Visit: Payer: Self-pay

## 2020-01-25 ENCOUNTER — Other Ambulatory Visit
Admission: RE | Admit: 2020-01-25 | Discharge: 2020-01-25 | Disposition: A | Discharge status: Home / Self Care | Payer: 59 | Source: Ambulatory Visit | Attending: Cardiovascular Disease | Admitting: Cardiovascular Disease

## 2020-01-25 DIAGNOSIS — Z20822 Contact with and (suspected) exposure to covid-19: Secondary | ICD-10-CM | POA: Diagnosis not present

## 2020-01-25 DIAGNOSIS — Z01812 Encounter for preprocedural laboratory examination: Secondary | ICD-10-CM | POA: Diagnosis present

## 2020-01-25 LAB — SARS CORONAVIRUS 2 (TAT 6-24 HRS): SARS Coronavirus 2: NEGATIVE

## 2020-01-27 ENCOUNTER — Ambulatory Visit
Admission: RE | Admit: 2020-01-27 | Discharge: 2020-01-27 | Disposition: A | Discharge status: Home / Self Care | Payer: 59 | Source: Ambulatory Visit | Attending: Cardiovascular Disease | Admitting: Cardiovascular Disease

## 2020-01-27 ENCOUNTER — Other Ambulatory Visit: Payer: Self-pay

## 2020-01-27 ENCOUNTER — Encounter
Admission: RE | Disposition: A | Discharge status: Home / Self Care | Payer: Self-pay | Source: Ambulatory Visit | Attending: Cardiovascular Disease

## 2020-01-27 ENCOUNTER — Encounter: Payer: Self-pay | Admitting: Cardiology

## 2020-01-27 DIAGNOSIS — D473 Essential (hemorrhagic) thrombocythemia: Secondary | ICD-10-CM | POA: Insufficient documentation

## 2020-01-27 DIAGNOSIS — Z79899 Other long term (current) drug therapy: Secondary | ICD-10-CM | POA: Diagnosis not present

## 2020-01-27 DIAGNOSIS — I25110 Atherosclerotic heart disease of native coronary artery with unstable angina pectoris: Secondary | ICD-10-CM | POA: Insufficient documentation

## 2020-01-27 DIAGNOSIS — Z6836 Body mass index (BMI) 36.0-36.9, adult: Secondary | ICD-10-CM | POA: Insufficient documentation

## 2020-01-27 DIAGNOSIS — E669 Obesity, unspecified: Secondary | ICD-10-CM | POA: Diagnosis not present

## 2020-01-27 DIAGNOSIS — E039 Hypothyroidism, unspecified: Secondary | ICD-10-CM | POA: Diagnosis not present

## 2020-01-27 DIAGNOSIS — E785 Hyperlipidemia, unspecified: Secondary | ICD-10-CM | POA: Insufficient documentation

## 2020-01-27 DIAGNOSIS — Z87891 Personal history of nicotine dependence: Secondary | ICD-10-CM | POA: Insufficient documentation

## 2020-01-27 DIAGNOSIS — I10 Essential (primary) hypertension: Secondary | ICD-10-CM | POA: Insufficient documentation

## 2020-01-27 DIAGNOSIS — G4733 Obstructive sleep apnea (adult) (pediatric): Secondary | ICD-10-CM | POA: Insufficient documentation

## 2020-01-27 DIAGNOSIS — I200 Unstable angina: Secondary | ICD-10-CM | POA: Insufficient documentation

## 2020-01-27 DIAGNOSIS — Z955 Presence of coronary angioplasty implant and graft: Secondary | ICD-10-CM | POA: Diagnosis not present

## 2020-01-27 HISTORY — PX: LEFT HEART CATH AND CORONARY ANGIOGRAPHY: CATH118249

## 2020-01-27 SURGERY — LEFT HEART CATH AND CORONARY ANGIOGRAPHY
Anesthesia: Moderate Sedation | Laterality: Left

## 2020-01-27 MED ORDER — ASPIRIN 81 MG PO CHEW
CHEWABLE_TABLET | ORAL | Status: AC
  Filled 2020-01-27: qty 1

## 2020-01-27 MED ORDER — ACETAMINOPHEN 325 MG PO TABS: 650.0000 mg | ORAL_TABLET | ORAL | Status: DC | PRN

## 2020-01-27 MED ORDER — IOHEXOL 300 MG/ML  SOLN
INTRAMUSCULAR | Status: DC | PRN
  Administered 2020-01-27: 130 mL

## 2020-01-27 MED ORDER — SODIUM BICARBONATE BOLUS VIA INFUSION
INTRAVENOUS | Status: DC
  Filled 2020-01-27: qty 1

## 2020-01-27 MED ORDER — MIDAZOLAM HCL 2 MG/2ML IJ SOLN
INTRAMUSCULAR | Status: DC | PRN
  Administered 2020-01-27: 1 mg via INTRAVENOUS

## 2020-01-27 MED ORDER — LABETALOL HCL 5 MG/ML IV SOLN: 10.0000 mg | INTRAVENOUS | Status: DC | PRN

## 2020-01-27 MED ORDER — FENTANYL CITRATE (PF) 100 MCG/2ML IJ SOLN
INTRAMUSCULAR | Status: DC | PRN
  Administered 2020-01-27: 25 ug via INTRAVENOUS

## 2020-01-27 MED ORDER — SODIUM CHLORIDE 0.9 % WEIGHT BASED INFUSION: 1.0000 mL/kg/h | INTRAVENOUS | Status: DC

## 2020-01-27 MED ORDER — FENTANYL CITRATE (PF) 100 MCG/2ML IJ SOLN
INTRAMUSCULAR | Status: AC
  Filled 2020-01-27: qty 2

## 2020-01-27 MED ORDER — HEPARIN (PORCINE) IN NACL 1000-0.9 UT/500ML-% IV SOLN
INTRAVENOUS | Status: DC | PRN
  Administered 2020-01-27: 500 mL

## 2020-01-27 MED ORDER — SODIUM CHLORIDE 0.9 % IV SOLN
INTRAVENOUS | Status: DC
  Administered 2020-01-27: 1000 mL via INTRAVENOUS

## 2020-01-27 MED ORDER — SODIUM CHLORIDE 0.9 % IV SOLN: 250.0000 mL | INTRAVENOUS | Status: DC | PRN

## 2020-01-27 MED ORDER — MIDAZOLAM HCL 2 MG/2ML IJ SOLN
INTRAMUSCULAR | Status: AC
  Filled 2020-01-27: qty 2

## 2020-01-27 MED ORDER — ASPIRIN 81 MG PO CHEW
81.0000 mg | CHEWABLE_TABLET | ORAL | Status: AC
  Administered 2020-01-27: 08:00:00 81 mg via ORAL

## 2020-01-27 MED ORDER — SODIUM CHLORIDE 0.9% FLUSH: 3.0000 mL | INTRAVENOUS | Status: DC | PRN

## 2020-01-27 MED ORDER — HYDRALAZINE HCL 20 MG/ML IJ SOLN: 10.0000 mg | INTRAMUSCULAR | Status: DC | PRN

## 2020-01-27 MED ORDER — SODIUM BICARBONATE 8.4 % IV SOLN
INTRAVENOUS | Status: DC
  Filled 2020-01-27: qty 1000

## 2020-01-27 MED ORDER — SODIUM CHLORIDE 0.9% FLUSH: 3.0000 mL | Freq: Two times a day (BID) | INTRAVENOUS | Status: DC

## 2020-01-27 MED ORDER — ONDANSETRON HCL 4 MG/2ML IJ SOLN: 4.0000 mg | Freq: Four times a day (QID) | INTRAMUSCULAR | Status: DC | PRN

## 2020-01-27 MED ORDER — HEPARIN (PORCINE) IN NACL 1000-0.9 UT/500ML-% IV SOLN
INTRAVENOUS | Status: AC
  Filled 2020-01-27: qty 1000

## 2020-01-27 SURGICAL SUPPLY — 10 items
CATH INFINITI 5FR ANG PIGTAIL (CATHETERS) ×2 IMPLANT
CATH INFINITI 5FR JL4 (CATHETERS) ×2 IMPLANT
CATH INFINITI JR4 5F (CATHETERS) ×2 IMPLANT
DEVICE CLOSURE MYNXGRIP 5F (Vascular Products) ×2 IMPLANT
KIT MANI 3VAL PERCEP (MISCELLANEOUS) ×3 IMPLANT
NDL PERC 18GX7CM (NEEDLE) IMPLANT
NEEDLE PERC 18GX7CM (NEEDLE) ×3 IMPLANT
PACK CARDIAC CATH (CUSTOM PROCEDURE TRAY) ×3 IMPLANT
SHEATH AVANTI 5FR X 11CM (SHEATH) ×2 IMPLANT
WIRE GUIDERIGHT .035X150 (WIRE) ×2 IMPLANT

## 2020-01-27 NOTE — Progress Notes (Signed)
DC instructions reviewed with pt. And her daughter with verbalized understanding. Work Careers information officer given to pt. : pt. To see MD in 3 days. Stable for DC home today.

## 2020-05-08 ENCOUNTER — Ambulatory Visit (INDEPENDENT_AMBULATORY_CARE_PROVIDER_SITE_OTHER): Payer: 59 | Admitting: Primary Care

## 2020-05-08 ENCOUNTER — Encounter: Payer: Self-pay | Admitting: Primary Care

## 2020-05-08 ENCOUNTER — Other Ambulatory Visit: Payer: Self-pay

## 2020-05-08 DIAGNOSIS — Z9989 Dependence on other enabling machines and devices: Secondary | ICD-10-CM

## 2020-05-08 DIAGNOSIS — G4733 Obstructive sleep apnea (adult) (pediatric): Secondary | ICD-10-CM

## 2020-05-08 NOTE — Patient Instructions (Signed)
Continue CPAP every night for 4+ hour No changes today  1 year follow-up    CPAP and BPAP Information CPAP and BPAP are methods of helping a person breathe with the use of air pressure. CPAP stands for "continuous positive airway pressure." BPAP stands for "bi-level positive airway pressure." In both methods, air is blown through your nose or mouth and into your air passages to help you breathe well. CPAP and BPAP use different amounts of pressure to blow air. With CPAP, the amount of pressure stays the same while you breathe in and out. With BPAP, the amount of pressure is increased when you breathe in (inhale) so that you can take larger breaths. Your health care provider will recommend whether CPAP or BPAP would be more helpful for you. Why are CPAP and BPAP treatments used? CPAP or BPAP can be helpful if you have:  Sleep apnea.  Chronic obstructive pulmonary disease (COPD).  Heart failure.  Medical conditions that weaken the muscles of the chest including muscular dystrophy, or neurological diseases such as amyotrophic lateral sclerosis (ALS).  Other problems that cause breathing to be weak, abnormal, or difficult. CPAP is most commonly used for obstructive sleep apnea (OSA) to keep the airways from collapsing when the muscles relax during sleep. How is CPAP or BPAP administered? Both CPAP and BPAP are provided by a small machine with a flexible plastic tube that attaches to a plastic mask. You wear the mask. Air is blown through the mask into your nose or mouth. The amount of pressure that is used to blow the air can be adjusted on the machine. Your health care provider will determine the pressure setting that should be used based on your individual needs. When should CPAP or BPAP be used? In most cases, the mask only needs to be worn during sleep. Generally, the mask needs to be worn throughout the night and during any daytime naps. People with certain medical conditions may also need  to wear the mask at other times when they are awake. Follow instructions from your health care provider about when to use the machine. What are some tips for using the mask?   Because the mask needs to be snug, some people feel trapped or closed-in (claustrophobic) when first using the mask. If you feel this way, you may need to get used to the mask. One way to do this is by holding the mask loosely over your nose or mouth and then gradually applying the mask more snugly. You can also gradually increase the amount of time that you use the mask.  Masks are available in various types and sizes. Some fit over your mouth and nose while others fit over just your nose. If your mask does not fit well, talk with your health care provider about getting a different one.  If you are using a mask that fits over your nose and you tend to breathe through your mouth, a chin strap may be applied to help keep your mouth closed.  The CPAP and BPAP machines have alarms that may sound if the mask comes off or develops a leak.  If you have trouble with the mask, it is very important that you talk with your health care provider about finding a way to make the mask easier to tolerate. Do not stop using the mask. Stopping the use of the mask could have a negative impact on your health. What are some tips for using the machine?  Place your CPAP or  BPAP machine on a secure table or stand near an electrical outlet.  Know where the on/off switch is located on the machine.  Follow instructions from your health care provider about how to set the pressure on your machine and when you should use it.  Do not eat or drink while the CPAP or BPAP machine is on. Food or fluids could get pushed into your lungs by the pressure of the CPAP or BPAP.  Do not smoke. Tobacco smoke residue can damage the machine.  For home use, CPAP and BPAP machines can be rented or purchased through home health care companies. Many different brands  of machines are available. Renting a machine before purchasing may help you find out which particular machine works well for you.  Keep the CPAP or BPAP machine and attachments clean. Ask your health care provider for specific instructions. Get help right away if:  You have redness or open areas around your nose or mouth where the mask fits.  You have trouble using the CPAP or BPAP machine.  You cannot tolerate wearing the CPAP or BPAP mask.  You have pain, discomfort, and bloating in your abdomen. Summary  CPAP and BPAP are methods of helping a person breathe with the use of air pressure.  Both CPAP and BPAP are provided by a small machine with a flexible plastic tube that attaches to a plastic mask.  If you have trouble with the mask, it is very important that you talk with your health care provider about finding a way to make the mask easier to tolerate. This information is not intended to replace advice given to you by your health care provider. Make sure you discuss any questions you have with your health care provider. Document Revised: 12/29/2018 Document Reviewed: 07/28/2016 Elsevier Patient Education  [place].

## 2020-05-08 NOTE — Progress Notes (Signed)
Virtual Visit via Telephone Note  I connected with Christina Zamora on 05/08/20 at  4:30 PM EDT by telephone and verified that I am speaking with the correct person using two identifiers.  Location: Patient: Home Provider: Office   I discussed the limitations, risks, security and privacy concerns of performing an evaluation and management service by telephone and the availability of in person appointments. I also discussed with the patient that there may be a patient responsible charge related to this service. The patient expressed understanding and agreed to proceed.   History of Present Illness: 59 year old female, former smoker. PMH significant for OSA. Patient of Dr. Mortimer Fries, last seen in December 2020.  Maintained on auto CPAP 5-15cm h20.   05/08/2020 Patient is doing well. No issues with CPAP mask or pressure setting. She averages about 5 hours of sleep a night d/t work. She goes to bed around 9:30pm and wakes up at 2:30am for work at [employer]. She reports significant benefit from wearing CPAP. She feels a tremendous difference in her energy level. She is not tired or sleepy the next day. She occasional has air leak in the middle of the ngiht, she will readjust mask at night and air leak resolved. She uses nasal mask. Denies respiratory symptoms or shortness of breath.   [facility] download 04/07/20-05/06/20: - Usage 28/30 days (93%); 16 days (53%) > 4 hours - Average usage days used 4 hours 22 mins - Pressure 5-15cm h20 (9.9cm h20-95%) - Airleaks 33.9L/min (95%) - AHI 1.5   Observations/Objective:  - Able to speak in No significant shortness of breath  **PFT 12/20 Moderate restrictive lung disease No obstruction  **HST 11/20 AHI 12 85 desat episodes Lowest o2 sat 71%  Assessment and Plan:  OSA: - 100% compliant and reports significant benefit from CPAP use  - Continue to wear CPAP every night for 4+ more hours (patient only gets 5 hours of sleep max at night) - No changes today -  Advised patient not to drive if experiencing excessive daytime fatigue or somnolence   Follow Up Instructions:   - 1 year with Dr. Mortimer Fries   I discussed the assessment and treatment plan with the patient. The patient was provided an opportunity to ask questions and all were answered. The patient agreed with the plan and demonstrated an understanding of the instructions.   The patient was advised to call back or seek an in-person evaluation if the symptoms worsen or if the condition fails to improve as anticipated.  I provided 18 minutes of non-face-to-face time during this encounter.   Martyn Ehrich, NP

## 2020-05-30 ENCOUNTER — Other Ambulatory Visit: Payer: Self-pay | Admitting: Internal Medicine

## 2020-05-30 DIAGNOSIS — R0602 Shortness of breath: Secondary | ICD-10-CM

## 2021-07-04 IMAGING — US US EXTREM LOW VENOUS*L*
1 series · 13 of 24 positions shown · non-contrast
Comparison: None.

CLINICAL DATA: Pain and swelling. Left lower extremity pain for 2
weeks.



[Series 1: us extrem low venous*left* · 47 acquisitions, 13 frames shown]
[im 1/47]
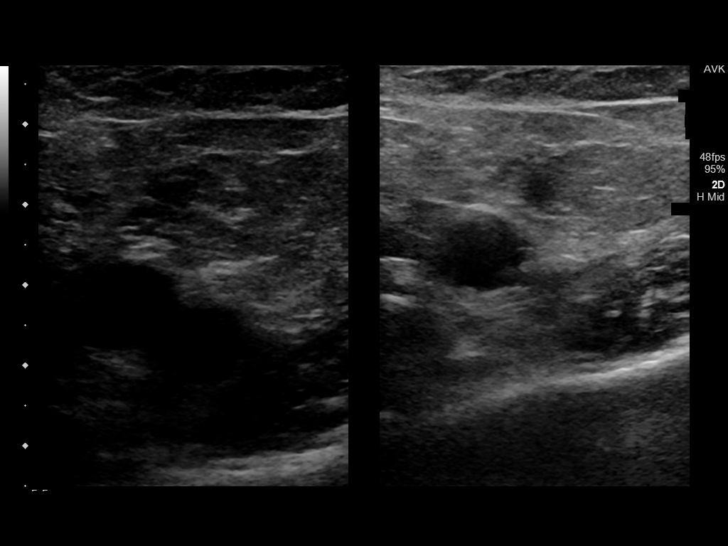
[im 5/47]
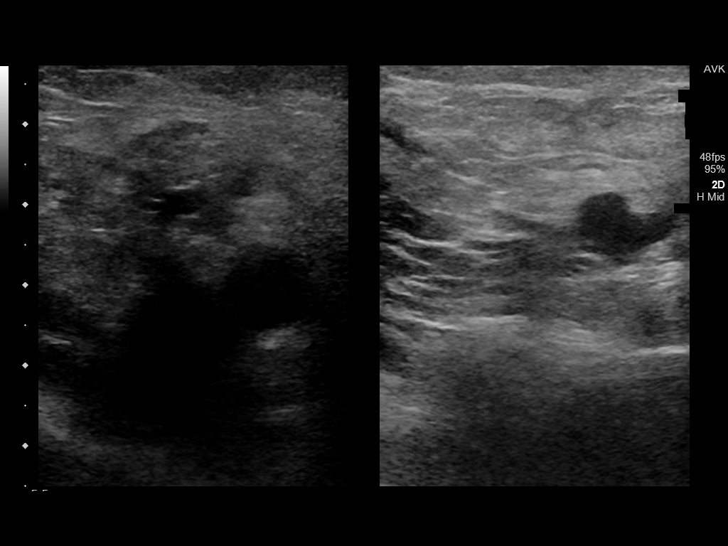
[im 9/47]
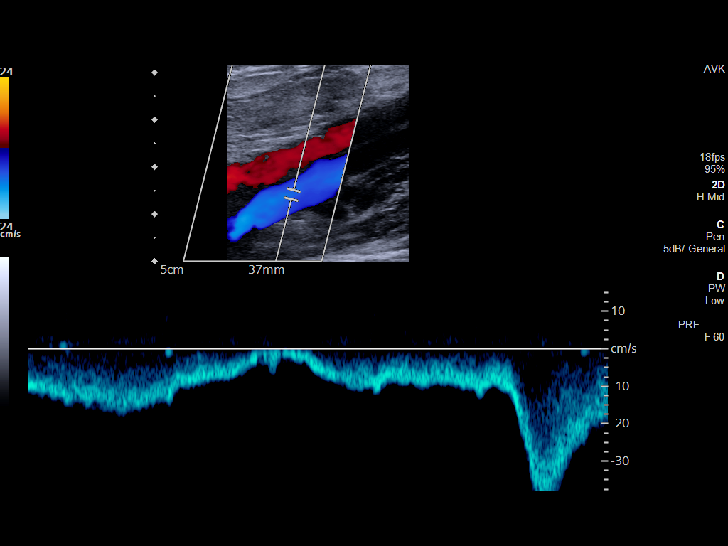
[im 13/47]
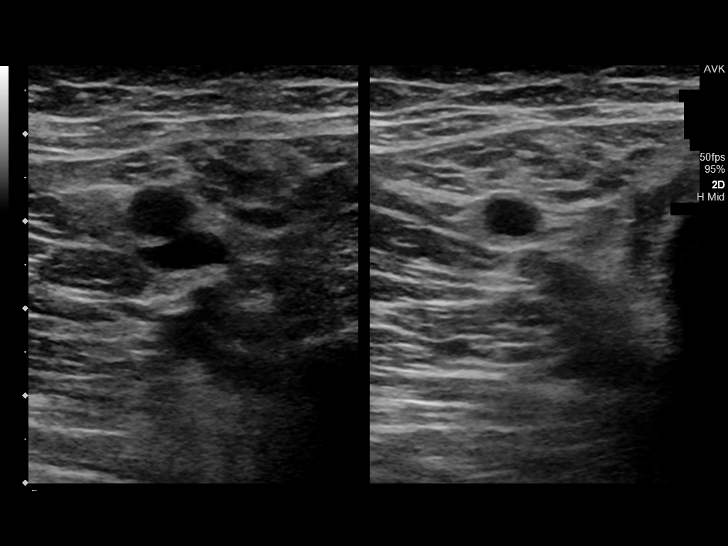
[im 17/47]
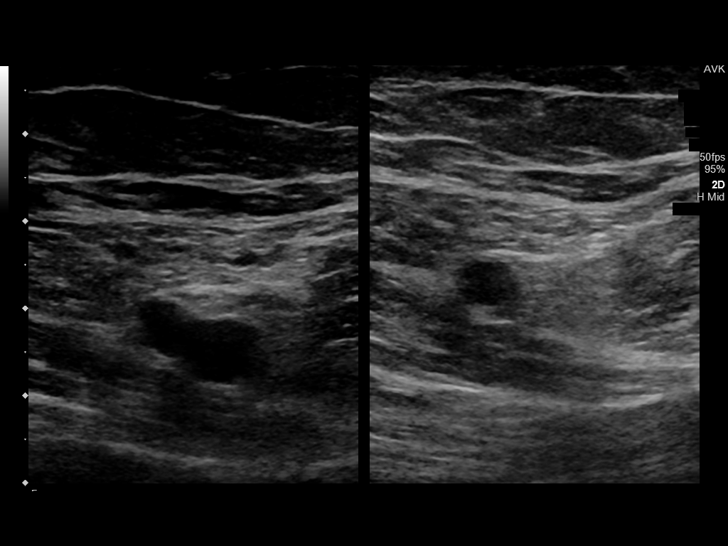
[im 21/47]
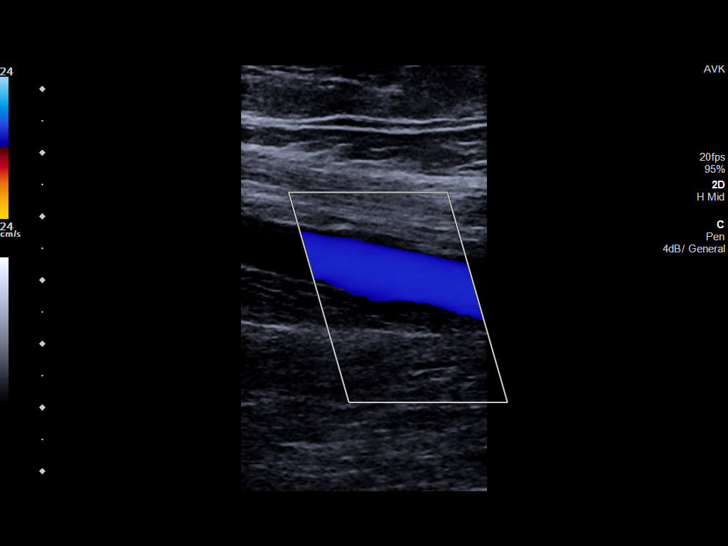
[im 27/47]
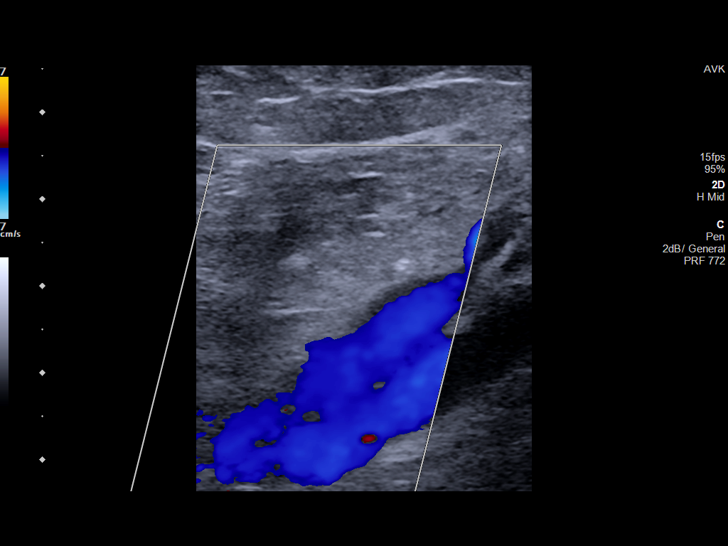
[im 29/47]
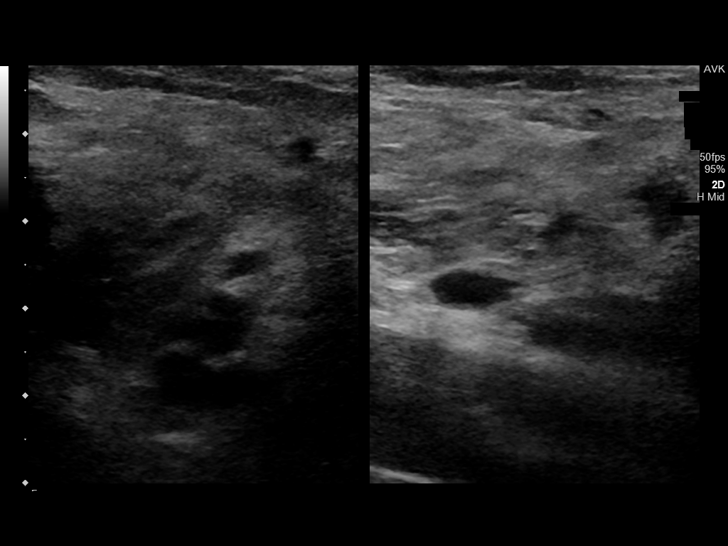
[im 33/47]
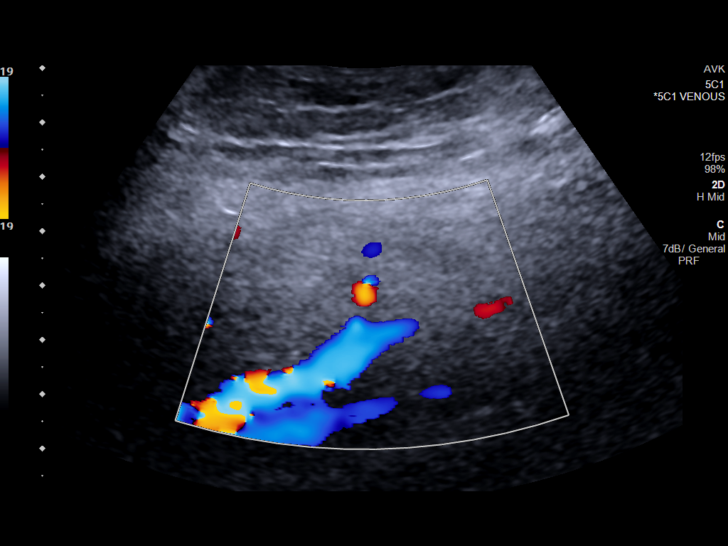
[im 37/47]
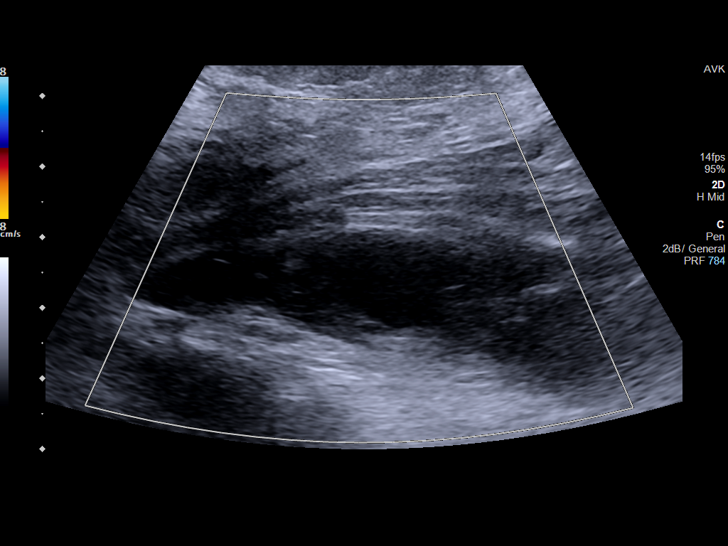
[im 41/47]
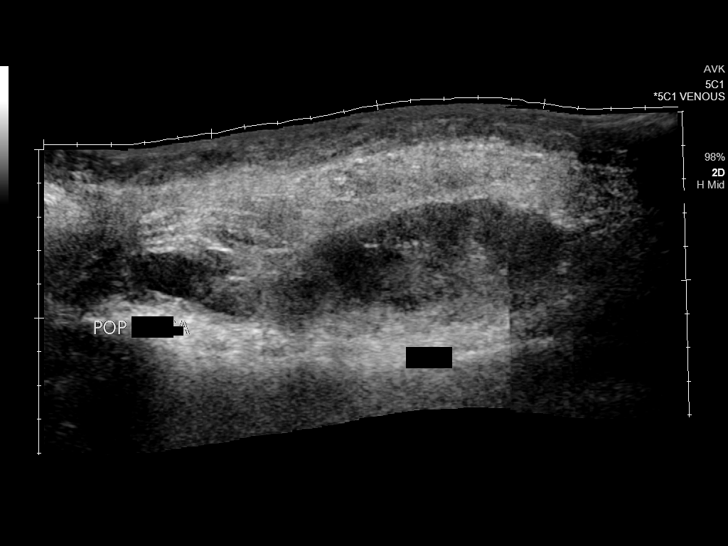
[im 45/47]
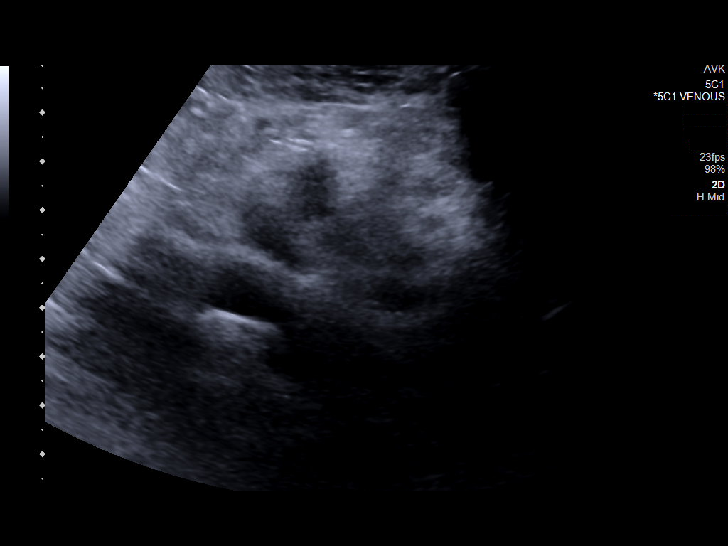
[im 47/47]
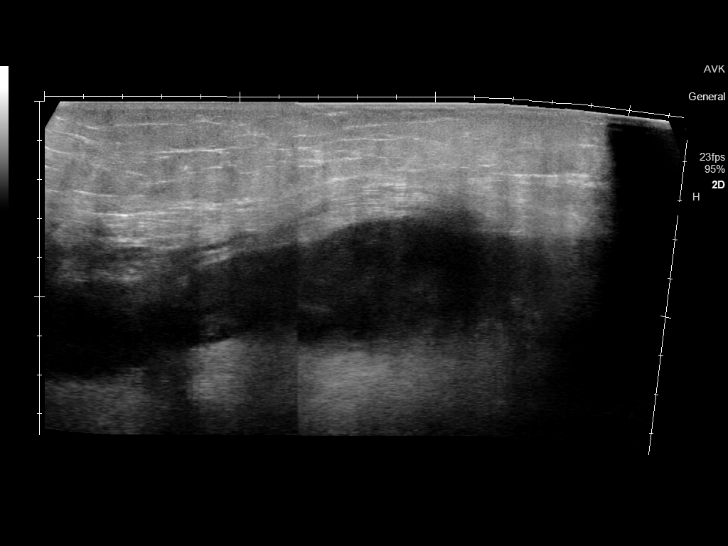

[13 of 24 positions shown; findings below may reference images not displayed]

FINDINGS: Contralateral Common Femoral Vein: Respiratory phasicity is normal
and symmetric with the symptomatic side. No evidence of thrombus.
Normal compressibility.

Common Femoral Vein: No evidence of thrombus. Normal
compressibility, respiratory phasicity and response to augmentation.

Saphenofemoral Junction: No evidence of thrombus. Normal
compressibility and flow on color Doppler imaging.

Profunda Femoral Vein: No evidence of thrombus. Normal
compressibility and flow on color Doppler imaging.

Femoral Vein: No evidence of thrombus. Normal compressibility,
respiratory phasicity and response to augmentation.

Popliteal Vein: No evidence of thrombus. Normal compressibility,
respiratory phasicity and response to augmentation.

Calf Veins: No evidence of thrombus. Normal compressibility and flow
on color Doppler imaging.

Superficial Great Saphenous Vein: No evidence of thrombus. Normal
compressibility.

Venous Reflux:  None.

Other Findings: Elongated avascular heterogeneous fluid collection
in the popliteal fossa measuring 3.6 x 3.6 x 17.3 cm extends into
the calf.
IMPRESSION: 1. No evidence of left lower extremity deep vein thrombosis.
2. Large avascular fluid collection in the popliteal fossa extending
into the calf likely large Baker cyst.

## 2021-07-17 IMAGING — US US DRAIN/INJ LARGE JOINT/BURSA
1 series · 6 of 6 positions shown · non-contrast
Comparison: Left lower extremity venous Doppler
ultrasound-08/02/2019

INDICATION: Symptomatic left-sided Baker cyst.

EXAM:
US DRAIN/INJ LARGE JOINT/BURSA

[Series 1: us drain/inj large joint/bursa · 0.08mm/px · 6 of 6 slices shown]
[im 1/6]
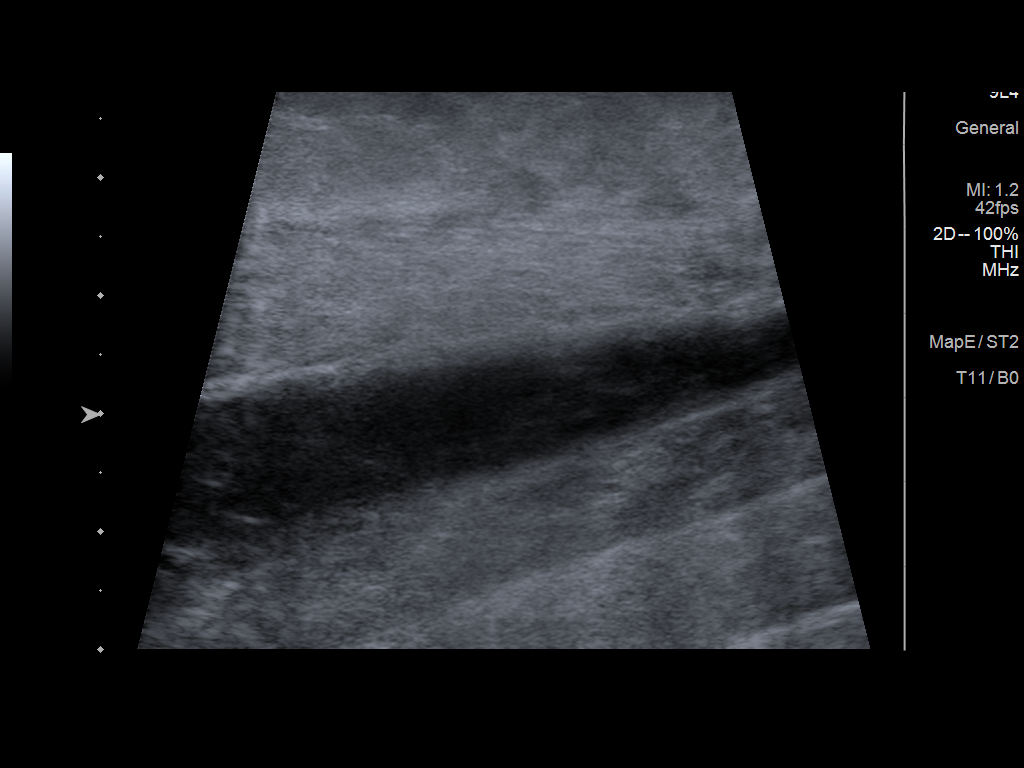
[im 2/6]
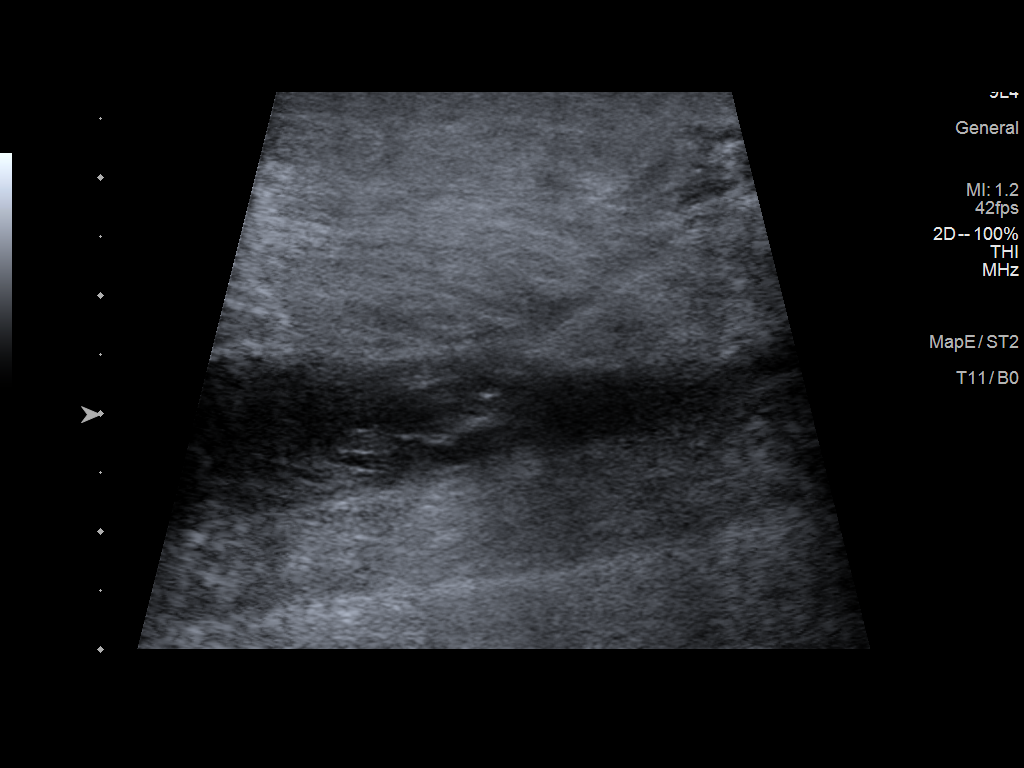
[im 3/6]
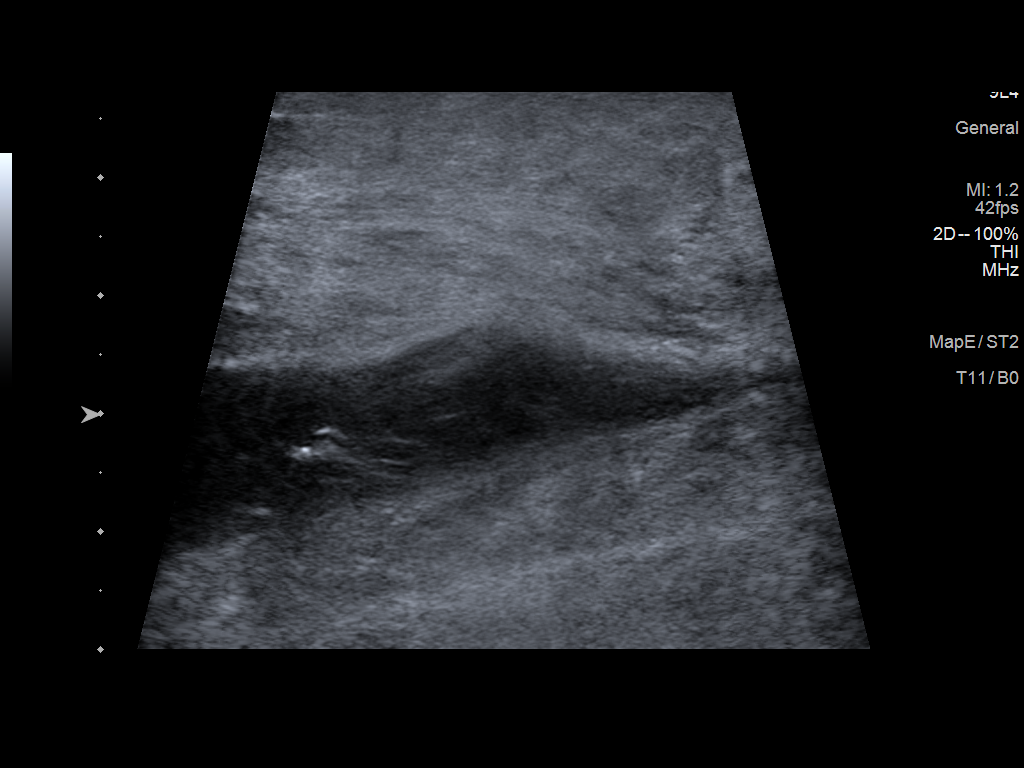
[im 4/6]
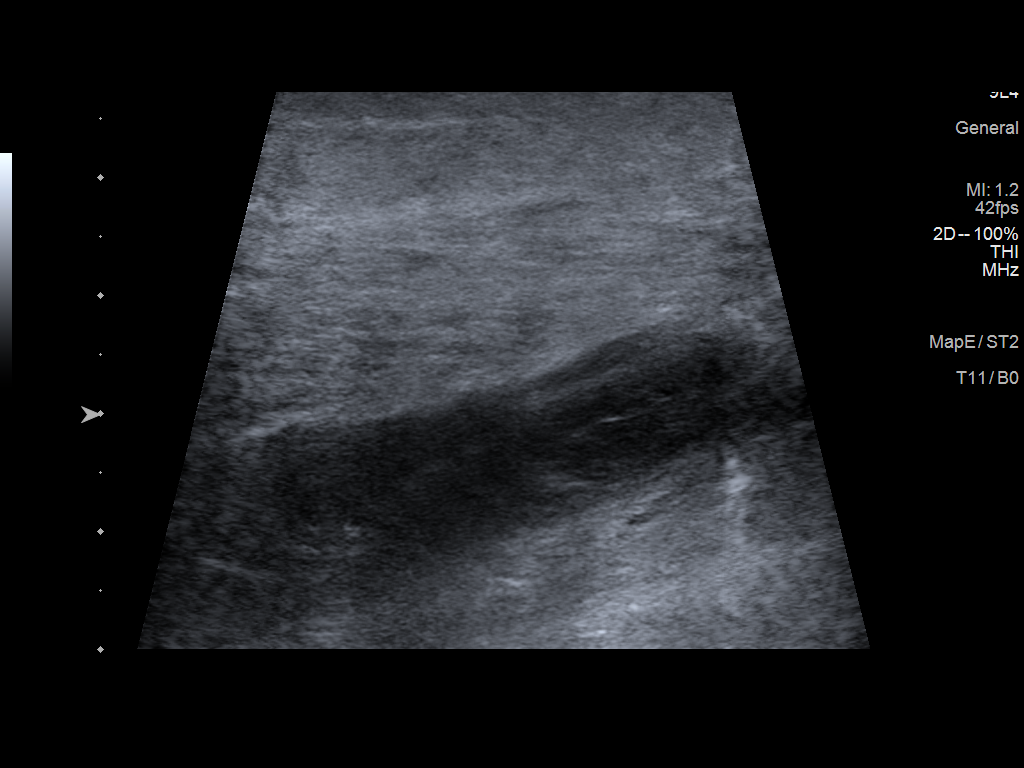
[im 5/6]
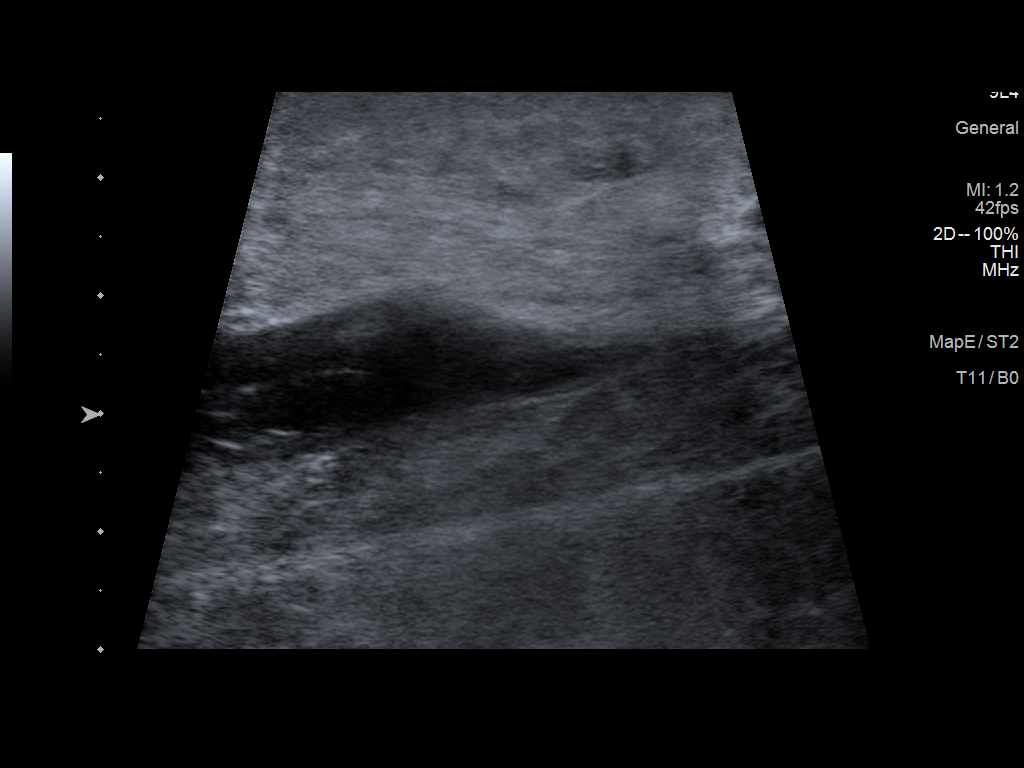
[im 6/6]
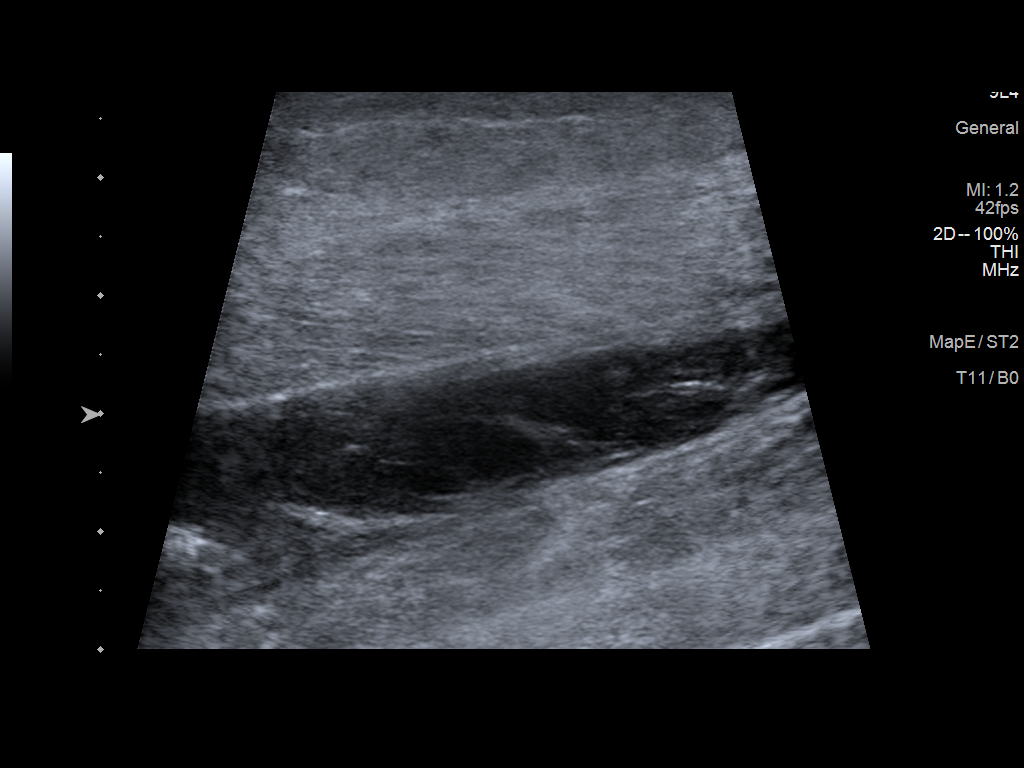

[6 of 6 positions shown; findings below may reference images not displayed]

MEDICATIONS:
None

ANESTHESIA/SEDATION:
None

CONTRAST:  None

COMPLICATIONS:
None immediate.

PROCEDURE:
Informed written consent was obtained from the patient after a
discussion of the risks, benefits and alternatives to treatment. The
patient was placed prone on the ultrasound table with preprocedural
imaging a very small left-sided Baker's cyst which appears separate
from the additional minimally complex fluid collection within the
intramuscular tissues of the left calf.

The larger slightly complex fluid collection within the
intramuscular tissues of the left calf correlated with the patient's
palpable area of concern however the collection was fairly
significantly reduced in size compared to the 08/02/2019
examination.

The skin overlying the posterior aspect of the left calf was prepped
and draped in usual sterile fashion. The overlying soft tissues were
anesthetized with 1% lidocaine.

An 18 gauge needle was advanced into the caudal aspect of the
dominant symptomatic collection within the left calf musculature.
Ultrasound image was saved for procedural documentation purposes.

Despite placement of the 18 gauge trocar needle through near the
entirety of the complex left calf collection utilizing a caudal to
cranial projection, only approximately 2 cc of bloody fluid was able
to be aspirated as the needle was slowly withdrawn.

As the symptomatic complex fluid collection was favored to represent
an evolving hematoma, steroid injection was not performed.

Superficial hemostasis was achieved with manual compression. A
dressing was placed. The patient tolerated the procedure well
without immediate postprocedural complication.
IMPRESSION: Technically successful ultrasound-guided aspiration of approximately
2 cc bloody fluid from the complex fluid collection within the
musculature of the left calf.

As the fluid collection has significantly decreased in size compared
to the 08/02/2019 examination and is favored to represent and
evolving hematoma, steroid injection was not performed.

## 2022-10-31 ENCOUNTER — Other Ambulatory Visit: Payer: Self-pay | Admitting: Cardiovascular Disease

## 2022-10-31 DIAGNOSIS — I2510 Atherosclerotic heart disease of native coronary artery without angina pectoris: Secondary | ICD-10-CM

## 2022-11-04 ENCOUNTER — Other Ambulatory Visit: Payer: Self-pay

## 2022-11-04 MED ORDER — TRAMADOL HCL 50 MG PO TABS: 50.0000 mg | ORAL_TABLET | Freq: Three times a day (TID) | ORAL | 0 refills | Status: DC

## 2022-11-27 ENCOUNTER — Other Ambulatory Visit: Payer: Self-pay | Admitting: Internal Medicine

## 2022-11-27 DIAGNOSIS — M5430 Sciatica, unspecified side: Secondary | ICD-10-CM

## 2022-12-03 ENCOUNTER — Other Ambulatory Visit: Payer: Self-pay | Admitting: Internal Medicine

## 2022-12-03 MED ORDER — TRAMADOL HCL 50 MG PO TABS: 50.0000 mg | ORAL_TABLET | Freq: Three times a day (TID) | ORAL | 0 refills | Status: DC

## 2022-12-05 ENCOUNTER — Ambulatory Visit: Payer: Self-pay | Admitting: Internal Medicine

## 2022-12-17 ENCOUNTER — Ambulatory Visit: Payer: Self-pay | Admitting: Internal Medicine

## 2022-12-17 ENCOUNTER — Encounter: Payer: Self-pay | Admitting: Internal Medicine

## 2022-12-17 VITALS — BP 120/80 | HR 68 | Temp 97.1°F | Ht 62.0 in | Wt 175.0 lb

## 2022-12-17 DIAGNOSIS — G8929 Other chronic pain: Secondary | ICD-10-CM

## 2022-12-17 DIAGNOSIS — I25118 Atherosclerotic heart disease of native coronary artery with other forms of angina pectoris: Secondary | ICD-10-CM | POA: Diagnosis not present

## 2022-12-17 MED ORDER — TRAMADOL HCL 50 MG PO TABS: 50.0000 mg | ORAL_TABLET | Freq: Three times a day (TID) | ORAL | 0 refills | Status: DC

## 2022-12-17 NOTE — Progress Notes (Signed)
Established Patient Office Visit  Subjective:  Patient ID: Christina Zamora, female    DOB: Apr 11, 1961  Age: 62 y.o. MRN: [id number]  Chief Complaint  Patient presents with   Follow-up    PM    Here for pain management follow up. Chronic pain well controlled on current analgesia. Last UDS satisfactory and pill counts have also been satisfactory.     No other concerns at this time.   Past Medical History:  Diagnosis Date   Arthritis    CAD (coronary artery disease)    Chronic headaches    GERD (gastroesophageal reflux disease)    Hyperlipidemia    Hypertension    Insomnia    Lumbago    Pulmonary HTN (HCC)    Thyroid disease    Vitamin D deficiency     Past Surgical History:  Procedure Laterality Date   BILATERAL CARPAL TUNNEL RELEASE  2000   BREAST EXCISIONAL BIOPSY Right 06/24/2010   surgical bx fat necrosis   CORONARY ANGIOGRAPHY N/A 05/29/2017   Procedure: CORONARY ANGIOGRAPHY;  Surgeon: Dionisio David, MD;  Location: [facility];  Service: Cardiovascular;  Laterality: N/A;   CORONARY ANGIOPLASTY WITH STENT PLACEMENT     LEFT HEART CATH Left 05/29/2017   Procedure: Left Heart Cath;  Surgeon: Dionisio David, MD;  Location: [facility];  Service: Cardiovascular;  Laterality: Left;   LEFT HEART CATH AND CORONARY ANGIOGRAPHY Left 01/27/2020   Procedure: LEFT HEART CATH AND CORONARY ANGIOGRAPHY;  Surgeon: Dionisio David, MD;  Location: [facility];  Service: Cardiovascular;  Laterality: Left;   REPLACEMENT TOTAL KNEE Left 2015    Social History   Socioeconomic History   Marital status: Married    Spouse name: Not on file   Number of children: Not on file   Years of education: Not on file   Highest education level: Not on file  Occupational History   Not on file  Tobacco Use   Smoking status: Former    Packs/day: 0.50    Years: 20.00    Additional pack years: 0.00    Total pack years: 10.00    Types: Cigarettes    Quit date: 2002     Years since quitting: 22.2   Smokeless tobacco: Never  Vaping Use   Vaping Use: Never used  Substance and Sexual Activity   Alcohol use: No    Alcohol/week: 0.0 standard drinks of alcohol   Drug use: No   Sexual activity: Not on file  Other Topics Concern   Not on file  Social History Narrative   Not on file   Social Determinants of Health   Financial Resource Strain: Not on file  Food Insecurity: Not on file  Transportation Needs: Not on file  Physical Activity: Not on file  Stress: Not on file  Social Connections: Not on file  Intimate Partner Violence: Not on file    Family History  Problem Relation Age of Onset   Heart failure Mother    Diabetes Mother    Hyperlipidemia Mother    Heart attack Father     Allergies  Allergen Reactions   Hydralazine Hcl Swelling and Palpitations    Review of Systems  Constitutional: Negative.   HENT: Negative.    Eyes: Negative.   Respiratory: Negative.    Cardiovascular: Negative.   Gastrointestinal: Negative.   Genitourinary: Negative.   Skin: Negative.   Neurological: Negative.   Endo/Heme/Allergies: Negative.  Objective:   BP 120/80   Pulse 68   Temp (!) 97.1 F (36.2 C) (Tympanic)   Ht 5\' 2"  (1.575 m)   Wt 175 lb (79.4 kg)   SpO2 91%   BMI 32.01 kg/m   Vitals:   12/17/22 1012  BP: 120/80  Pulse: 68  Temp: (!) 97.1 F (36.2 C)  Height: 5\' 2"  (1.575 m)  Weight: 175 lb (79.4 kg)  SpO2: 91%  TempSrc: Tympanic  BMI (Calculated): 32    Physical Exam Vitals reviewed.  Constitutional:      General: She is not in acute distress.    Appearance: She is obese.  HENT:     Head: Normocephalic.     Nose: Nose normal.     Mouth/Throat:     Mouth: Mucous membranes are moist.  Eyes:     Extraocular Movements: Extraocular movements intact.     Pupils: Pupils are equal, round, and reactive to light.  Cardiovascular:     Rate and Rhythm: Normal rate and regular rhythm.     Heart sounds: No murmur  heard. Pulmonary:     Effort: Pulmonary effort is normal.     Breath sounds: No rhonchi or rales.  Abdominal:     General: Abdomen is flat.     Palpations: There is no hepatomegaly, splenomegaly or mass.  Musculoskeletal:        General: Normal range of motion.     Cervical back: Normal range of motion. No tenderness.  Skin:    General: Skin is warm and dry.  Neurological:     General: No focal deficit present.     Mental Status: She is alert and oriented to person, place, and time.     Cranial Nerves: No cranial nerve deficit.     Motor: No weakness.  Psychiatric:        Mood and Affect: Mood normal.        Behavior: Behavior normal.      No results found for any visits on 12/17/22.  No results found for this or any previous visit (from the past 2160 hour(Glennie Rodda)).    Assessment & Plan:   Problem List Items Addressed This Visit   None   No follow-ups on file.   Total time spent: 20 minutes  Volanda Napoleon, MD  12/17/2022

## 2022-12-23 ENCOUNTER — Other Ambulatory Visit: Payer: Self-pay | Admitting: Cardiovascular Disease

## 2023-02-04 ENCOUNTER — Telehealth: Payer: Self-pay

## 2023-02-04 ENCOUNTER — Ambulatory Visit: Payer: Self-pay | Admitting: Internal Medicine

## 2023-02-04 NOTE — Telephone Encounter (Signed)
Patient called at 2:23P to r/s her appointment for PM, she was informed she had already missed her appt. She then stated that she's been in the Hospital with her daughter who had surgery on the 3rd and her Kidney fx is really low. She is asking for you to send her medication in anyway please advise

## 2023-02-07 ENCOUNTER — Other Ambulatory Visit: Payer: Self-pay | Admitting: Cardiovascular Disease

## 2023-02-09 ENCOUNTER — Encounter: Payer: Self-pay | Admitting: Internal Medicine

## 2023-02-09 ENCOUNTER — Ambulatory Visit: Payer: Self-pay | Admitting: Internal Medicine

## 2023-02-09 ENCOUNTER — Other Ambulatory Visit: Payer: Self-pay | Admitting: Internal Medicine

## 2023-02-09 VITALS — BP 120/80 | HR 72 | Ht 62.0 in | Wt 172.8 lb

## 2023-02-09 DIAGNOSIS — G8929 Other chronic pain: Secondary | ICD-10-CM

## 2023-02-09 MED ORDER — TRAMADOL HCL 50 MG PO TABS
50.0000 mg | ORAL_TABLET | Freq: Three times a day (TID) | ORAL | 0 refills | Status: DC
Start: 2023-02-09 — End: 2023-03-07

## 2023-02-09 MED ORDER — TRAMADOL HCL 50 MG PO TABS
50.0000 mg | ORAL_TABLET | Freq: Three times a day (TID) | ORAL | 0 refills | Status: DC
Start: 2023-03-08 — End: 2023-03-06

## 2023-02-09 NOTE — Progress Notes (Signed)
Established Patient Office Visit  Subjective:  Patient ID: Christina Zamora, female    DOB: 21-Aug-1961  Age: 62 y.o. MRN: [id number]  Chief Complaint  Patient presents with   Follow-up    PM    Here for pain management follow up. Chronic pain well controlled on current analgesia. Pill counts have also been satisfactory.     No other concerns at this time.   Past Medical History:  Diagnosis Date   Arthritis    CAD (coronary artery disease)    Chronic headaches    GERD (gastroesophageal reflux disease)    Hyperlipidemia    Hypertension    Insomnia    Lumbago    Pulmonary HTN (HCC)    Thyroid disease    Vitamin D deficiency     Past Surgical History:  Procedure Laterality Date   BILATERAL CARPAL TUNNEL RELEASE  2000   BREAST EXCISIONAL BIOPSY Right 06/24/2010   surgical bx fat necrosis   CORONARY ANGIOGRAPHY N/A 05/29/2017   Procedure: CORONARY ANGIOGRAPHY;  Surgeon: Laurier Nancy, MD;  Location: [facility];  Service: Cardiovascular;  Laterality: N/A;   CORONARY ANGIOPLASTY WITH STENT PLACEMENT     LEFT HEART CATH Left 05/29/2017   Procedure: Left Heart Cath;  Surgeon: Laurier Nancy, MD;  Location: [facility];  Service: Cardiovascular;  Laterality: Left;   LEFT HEART CATH AND CORONARY ANGIOGRAPHY Left 01/27/2020   Procedure: LEFT HEART CATH AND CORONARY ANGIOGRAPHY;  Surgeon: Laurier Nancy, MD;  Location: [facility];  Service: Cardiovascular;  Laterality: Left;   REPLACEMENT TOTAL KNEE Left 2015    Social History   Socioeconomic History   Marital status: Married    Spouse name: Not on file   Number of children: Not on file   Years of education: Not on file   Highest education level: Not on file  Occupational History   Not on file  Tobacco Use   Smoking status: Former    Packs/day: 0.50    Years: 20.00    Additional pack years: 0.00    Total pack years: 10.00    Types: Cigarettes    Quit date: 2002    Years since quitting:  22.3   Smokeless tobacco: Never  Vaping Use   Vaping Use: Never used  Substance and Sexual Activity   Alcohol use: No    Alcohol/week: 0.0 standard drinks of alcohol   Drug use: No   Sexual activity: Not on file  Other Topics Concern   Not on file  Social History Narrative   Not on file   Social Determinants of Health   Financial Resource Strain: Not on file  Food Insecurity: Not on file  Transportation Needs: Not on file  Physical Activity: Not on file  Stress: Not on file  Social Connections: Not on file  Intimate Partner Violence: Not on file    Family History  Problem Relation Age of Onset   Heart failure Mother    Diabetes Mother    Hyperlipidemia Mother    Heart attack Father     Allergies  Allergen Reactions   Hydralazine Hcl Swelling and Palpitations    Review of Systems  Constitutional: Negative.   HENT: Negative.    Eyes: Negative.   Respiratory: Negative.    Cardiovascular: Negative.   Gastrointestinal: Negative.   Genitourinary: Negative.   Skin: Negative.   Neurological: Negative.   Endo/Heme/Allergies: Negative.        Objective:  BP 120/80   Pulse 72   Ht 5\' 2"  (1.575 m)   Wt 172 lb 12.8 oz (78.4 kg)   SpO2 98%   BMI 31.61 kg/m   Vitals:   02/09/23 1013  BP: 120/80  Pulse: 72  Height: 5\' 2"  (1.575 m)  Weight: 172 lb 12.8 oz (78.4 kg)  SpO2: 98%  BMI (Calculated): 31.6    Physical Exam Vitals reviewed.  Constitutional:      General: She is not in acute distress.    Appearance: She is obese.  HENT:     Head: Normocephalic.     Nose: Nose normal.     Mouth/Throat:     Mouth: Mucous membranes are moist.  Eyes:     Extraocular Movements: Extraocular movements intact.     Pupils: Pupils are equal, round, and reactive to light.  Cardiovascular:     Rate and Rhythm: Normal rate and regular rhythm.     Heart sounds: No murmur heard. Pulmonary:     Effort: Pulmonary effort is normal.     Breath sounds: No rhonchi or  rales.  Abdominal:     General: Abdomen is flat.     Palpations: There is no hepatomegaly, splenomegaly or mass.  Musculoskeletal:        General: Normal range of motion.     Cervical back: Normal range of motion. No tenderness.  Skin:    General: Skin is warm and dry.  Neurological:     General: No focal deficit present.     Mental Status: She is alert and oriented to person, place, and time.     Cranial Nerves: No cranial nerve deficit.     Motor: No weakness.  Psychiatric:        Mood and Affect: Mood normal.        Behavior: Behavior normal.      No results found for any visits on 02/09/23.  No results found for this or any previous visit (from the past 2160 hour(Shahzad Thomann)).    Assessment & Plan:   Problem List Items Addressed This Visit   None Visit Diagnoses     Other chronic pain    -  Primary   Relevant Medications   traMADol (ULTRAM) 50 MG tablet   traMADol (ULTRAM) 50 MG tablet (Start on 03/08/2023)   Other Relevant Orders   Drug Screen 13 with reflex Confirmation (AMP,BAR,BZO,COC,PCP,THC,OPI,OXY,MD,FEN,MEP,PPX,TRAM), Serum       Return in about 7 weeks (around 03/30/2023).   Total time spent: 20 minutes  Luna Fuse, MD  02/09/2023   This document may have been prepared by [facility] Voice Recognition software and as such may include unintentional dictation errors.

## 2023-02-10 LAB — LIPID PANEL W/O CHOL/HDL RATIO
Cholesterol, Total: 155 mg/dL (ref 100–199)
HDL: 53 mg/dL (ref >39)
LDL Chol Calc (NIH): 81 mg/dL (ref 0–99)
Triglycerides: 115 mg/dL (ref 0–149)
VLDL Cholesterol Cal: 21 mg/dL (ref 5–40)

## 2023-02-10 LAB — TSH: TSH: 2.37 u[IU]/mL (ref 0.450–4.500)

## 2023-02-18 LAB — DRUG SCREEN 13 W/CONF , SERUM
Amphetamines, IA: NEGATIVE ng/mL
Barbiturates, IA: NEGATIVE ug/mL
Benzodiazepines, IA: NEGATIVE ng/mL
Cocaine & Metabolite, IA: NEGATIVE ng/mL
FENTANYL, IA: NEGATIVE ng/mL
MEPERIDINE, IA: NEGATIVE ng/mL
Methadone, IA: NEGATIVE ng/mL
Opiates, IA: NEGATIVE ng/mL
Oxycodones, IA: NEGATIVE ng/mL
Phencyclidine, IA: NEGATIVE ng/mL
Propoxyphene, IA: NEGATIVE ng/mL
THC(Marijuana) Metabolite, IA: NEGATIVE ng/mL
TRAMADOL, IA: POSITIVE ng/mL — AB

## 2023-02-18 LAB — TRAMADOL,MS,WB/SP RFX
O-Desmethyltramadol: 7.9 ng/mL
Tramadol Confirmation: POSITIVE
Tramadol: 105.1 ng/mL

## 2023-02-18 NOTE — Progress Notes (Signed)
#   one file is not In service

## 2023-03-04 ENCOUNTER — Other Ambulatory Visit: Payer: Self-pay | Admitting: Cardiovascular Disease

## 2023-03-04 ENCOUNTER — Other Ambulatory Visit: Payer: Self-pay | Admitting: Internal Medicine

## 2023-03-04 DIAGNOSIS — M5430 Sciatica, unspecified side: Secondary | ICD-10-CM

## 2023-03-04 DIAGNOSIS — E039 Hypothyroidism, unspecified: Secondary | ICD-10-CM

## 2023-03-04 DIAGNOSIS — I2510 Atherosclerotic heart disease of native coronary artery without angina pectoris: Secondary | ICD-10-CM

## 2023-03-05 ENCOUNTER — Telehealth: Payer: Self-pay

## 2023-03-05 NOTE — Telephone Encounter (Signed)
Pt lm asking for me to call her back about a medication not being refilled. Tried to call patient but unable to LVM no VM set up

## 2023-03-06 ENCOUNTER — Other Ambulatory Visit: Payer: Self-pay

## 2023-03-06 DIAGNOSIS — G8929 Other chronic pain: Secondary | ICD-10-CM

## 2023-03-06 MED ORDER — TRAMADOL HCL 50 MG PO TABS
50.0000 mg | ORAL_TABLET | Freq: Three times a day (TID) | ORAL | 0 refills | Status: DC
Start: 2023-03-08 — End: 2023-04-06

## 2023-03-13 ENCOUNTER — Other Ambulatory Visit: Payer: Self-pay | Admitting: Cardiovascular Disease

## 2023-03-20 ENCOUNTER — Ambulatory Visit: Payer: Self-pay | Admitting: Cardiovascular Disease

## 2023-03-20 ENCOUNTER — Encounter: Payer: Self-pay | Admitting: Cardiovascular Disease

## 2023-03-20 ENCOUNTER — Ambulatory Visit: Payer: Self-pay | Admitting: Internal Medicine

## 2023-03-20 VITALS — BP 138/90 | HR 73 | Ht 62.0 in | Wt 174.4 lb

## 2023-03-20 DIAGNOSIS — I25118 Atherosclerotic heart disease of native coronary artery with other forms of angina pectoris: Secondary | ICD-10-CM | POA: Diagnosis not present

## 2023-03-20 DIAGNOSIS — R0602 Shortness of breath: Secondary | ICD-10-CM | POA: Diagnosis not present

## 2023-03-20 DIAGNOSIS — I340 Nonrheumatic mitral (valve) insufficiency: Secondary | ICD-10-CM

## 2023-03-20 DIAGNOSIS — I10 Essential (primary) hypertension: Secondary | ICD-10-CM | POA: Diagnosis not present

## 2023-03-20 NOTE — Progress Notes (Signed)
Cardiology Office Note   Date:  03/20/2023   ID:  Christina Zamora, DOB Sep 05, 1961, MRN [id number]  PCP:  Sherron Monday, MD  Cardiologist:  Adrian Blackwater, MD      History of Present Illness: Christina Zamora is a 62 y.o. female who presents for  Chief Complaint  Patient presents with   Follow-up    Med refills    Under lots of stress as daughter passed.      Past Medical History:  Diagnosis Date   Arthritis    CAD (coronary artery disease)    Chronic headaches    GERD (gastroesophageal reflux disease)    Hyperlipidemia    Hypertension    Insomnia    Lumbago    Pulmonary HTN (HCC)    Thyroid disease    Vitamin D deficiency      ACTIVE PROBLEMS    Chest pain, unspecified    Coronary Artery Disease    Essential Hypertension    Essential Thrombocytosis    Hyperlipidemia    Hypothyroidism    Obstructive sleep apnea (adult) (pediatric)     CHIEF COMPLAINT  The Chief Complaint is: Cath Results.     REFERRED HERE  Hospital Follow Up.     HISTORY OF PRESENT ILLNESS  Christina Zamora is a 62 year old female.   Here for cath results.  Allergy list reviewed   Problem list reviewed   Medication reconciliation performed   Medication list reviewed    Chest pain or discomfort    No palpitations   The heart rate was not fast   No intermittent leg claudication    Dyspnea    Not feeling congested in the chest   No paroxysmal nocturnal dyspnea   No orthopnea   No cough   No wheezing     CURRENT MEDICATION    amLODIPine Besylate 10 MG Oral Tablet 1 every morning, 90 days, 1 refills    Atorvastatin Calcium 80 MG Oral Tablet 1 every bedtime, 90 days, 1 refills    buPROPion HCl ER (SR) 150 MG Oral Tablet Extended Release 12 Hour twice a day, 90 days, 1 refills    Butalbital-APAP-Caffeine 50-325-40 MG Oral Capsule 1 every 4 - 6 hours as needed, 30 days, 3 refills    Celecoxib 200 MG Oral Capsule TAKE 1 CAPSULE BY MOUTH EVERY DAY IN THE MORNING, 90 days, 1  refills    Clopidogrel Bisulfate 75 MG Oral Tablet TAKE 1 TABLET BY MOUTH EVERY DAY, 90 days, 1 refills    Cyclobenzaprine HCl 10 MG Oral Tablet TAKE 1 TABLET BY MOUTH AT BEDTIME FOR MUSCLE RELAXANT, WILL MAKE DROWSY, 30 days, 3 refills    Ezetimibe 10 MG Oral Tablet 1 every morning, 90 days, 1 refills    hydroCHLOROthiazide 25 MG Oral Tablet TAKE 1 TABLET BY MOUTH EVERY DAY, 90 days, 1 refills    Metoprolol Succinate ER 25 MG Oral Tablet Extended Release 24 Hour TAKE 1 TABLET BY MOUTH EVERY DAY, 90 days, 2 refills    Nitrostat 0.4 MG Sublingual Tablet Sublingual as needed dissolve SL for CP q 5 min x max of 3, 30 days, 1 refills    Pantoprazole Sodium 40 MG Oral Tablet Delayed Release TAKE 1 TABLET BY MOUTH TWICE A DAY, 30 days, 3 refills    Ramipril 10 MG Oral Capsule 1 every morning, 90 days, 1 refills    traMADol HCl 50 MG Oral Tablet 1 every 6 hours, 30 days, 0  refills     PAST MEDICAL/SURGICAL HISTORY  Reported:  No recent change in medical history. Recent Events: Medication noncompliance lipitor. Medical: Reviewed allergy. Tests: Blood pressure diastolic ranging to 84 mmHg. Diagnoses:  Coronary artery disease 01/14/06 [facility] CATH HAD 20 % RESTENSIS RCA  DES STENT. NORMAL LVEF. INSIGNIFICANT LAD, AND LCX DISEASE IN 2006 AND IN 8/10 CATHS. Had repeat cardiac cath at [facility] by Dr. Sharyn Lull and found to have critical osteal disease of OM, which was too small for PCI    CCTA 02/24/13: calcium score 2600, mild to moderate RCA distal to stent, RCA stent patent, LCX/LAD calcified with no obstructive disease    CCTA 02/2017: Ca Score 3463, moderate diffuse disease in RCA/LAD, and LCX. Stent patent. Adivse aggressive treatment with medical therapy.     CATH 05/2017: 50% occlusion proximal LAD treat medically, 60% distal RCA, stent in RCA is 30% occluded. Continue to treat medically with Ranexa, statins, plavix Essential hypertension.   Hyperlipidemia Obesity.   Thyroid disorder Stent in RCA  2006  cardiac cath 12/2010 no significant CAD, RCA stent patent  cardiac cath 03/31/13 prox LAD 40%,mid LAD 40%, mid CFX 30%, prox RCA 30% at site of stent    Surgeries:  s/p lap band for obesity  Hysterectomy  Cholecystectomy  B carpal tunnel release. Procedural:   PTCA was performed RCA 10/18/04 PTCA/STENTING [place].    Cath 07/08 showed mild instent restenosis in RCA and a moderate 50% lesion in Mid LAD Surgical:   Cholecystectomy  Hysterectomy     SOCIAL HISTORY  Social history unchanged. Tobacco use: Smoking status: Former smoker. Habits: A recent increase in physical activity.     ALLERGIES    hydrALAZINE HCl     Reaction: Shortness of Breath    Imdur (Intolerance)     Reaction: Headache     FAMILY HISTORY  Heart disease  Family history unchanged     REVIEW OF SYSTEMS  Systemic: Not feeling poorly (malaise).  No recent weight change. Head: No headache and no sinus pain. Cardiovascular: Chest pain or discomfort.  No palpitations and the heart rate was not fast. Pulmonary: Dyspnea.  No cough and no wheezing. Gastrointestinal: No heartburn.  No nausea, no vomiting, no abdominal pain, and no diarrhea. Neurological: No dizziness, no vertigo, and no fainting. Psychological: No anxiety and no depression.     PHYSICAL FINDINGS    Vitals taken 01/30/2020 02:03 pm  BP-Sitting L  136/82 mmHg 100 - 120/56 - 80  Pulse Rate-Sitting  73 bpm 50 - 100  Temp-Tympanic  97.6 F 96 - 101  Height  62 in 59 - 68  Weight  203 lbs  96 - 178  Body Mass Index  37.1 kg/m2   Body Surface Area  1.92 m2   Oxygen Saturation  98 % 93 - 100     Vitals taken 01/27/2020 11:18 am  Temp-Oral  36.6 C 35.56 - 38.33  Height  157.5 cm 149.86 - 172.72  Weight  90.719 kg 43.55 - 80.74  Body Mass Index  36.6 kg/m2   Body Surface Area  1.91 m2      Vitals taken 01/27/2020 02:30 pm  BP  109/95 mmHg 100 - 120/56 - 80  Pulse Rate  74 bpm 50 - 100  Respiration Rate  20 per min 18 - 26  Oxygen  Saturation  99 % 93 - 100    General Appearance:  In no acute distress. Lungs:  Clear to auscultation. Cardiovascular:  Jugular Venous Distention:  JVD not increased.   JVD not increased. Heart Rate And Rhythm:  Normal.   Normal. Heart Sounds:  Normal. Murmurs:  No murmurs were heard. Carotid Arteries:  No bruit in the carotid artery. Arterial Pulses:  Equal bilaterally and normal. Edema:  Not present. Abdomen: Auscultation:  Bowel sounds were normal. Palpation:  Abdominal non-tender. Neurological:  Oriented to time, place, and person.     ASSESSMENT   Coronary artery disease  Hypertension  Hyperlipidemia  Obesity     THERAPY   Continue current medication.  Clinical summary provided to patient.     COUNSELING/EDUCATION   Education and counseling chest pain  Did not discuss concerns about tobacco use     PLAN     Other Isosorbide Mononitrate ER 60 MG tablet once a day, 90 days, 0 refills     Return to the clinic if condition worsens or new symptoms arise  Follow-up visit CAD / Chest Pain - increase isosorbide to 60mg  daily. continue plavix atorvastatin 80mg  and zetia. patient does not want to take aspirin.     HTN - well controlled continue current regimen.    Cath Results  ? Mid RCA lesion is 55% stenosed.  ? Prox LAD to Mid LAD lesion is 45% stenosed.  ? Prox Cx to Mid Cx lesion is 30% stenosed.  ?  Modeate disease mid LAD and distal RCA and normal EF. Treat medically    f/u 2 months.     HEALTH REMINDERS    Assess Blood Pressure satisfied 01/30/2020.    Assess BMI satisfied 01/30/2020.    Assess Tobacco Use satisfied 01/30/2020.      Adrian Blackwater MD  Electronically signed by: Adrian Blackwater     Date: 02/01/2020 09:19 Past Surgical History:  Procedure Laterality Date   BILATERAL CARPAL TUNNEL RELEASE  2000   BREAST EXCISIONAL BIOPSY Right 06/24/2010   surgical bx fat necrosis   CORONARY ANGIOGRAPHY N/A 05/29/2017   Procedure:  CORONARY ANGIOGRAPHY;  Surgeon: Laurier Nancy, MD;  Location: [facility] INVASIVE CV LAB;  Service: Cardiovascular;  Laterality: N/A;   CORONARY ANGIOPLASTY WITH STENT PLACEMENT     LEFT HEART CATH Left 05/29/2017   Procedure: Left Heart Cath;  Surgeon: Laurier Nancy, MD;  Location: [facility] INVASIVE CV LAB;  Service: Cardiovascular;  Laterality: Left;   LEFT HEART CATH AND CORONARY ANGIOGRAPHY Left 01/27/2020   Procedure: LEFT HEART CATH AND CORONARY ANGIOGRAPHY;  Surgeon: Laurier Nancy, MD;  Location: [facility] INVASIVE CV LAB;  Service: Cardiovascular;  Laterality: Left;   REPLACEMENT TOTAL KNEE Left 2015   REASON FOR VISIT  Referred by Dr.Benigno Check [place] Flakes.        TESTS  Imaging: Computed Tomographic Angiography:  Cardiac multidetector CT was performed paying particular attention to the coronary arteries for the diagnosis of: CAD. Diagnostic Drugs:  Administered iohexol (Omnipaque) through an antecubital vein and images from the examination were analyzed for the presence and extent of coronary artery disease, using 3D image processing software. 100 mL of non-ionic contrast (Omnipaque) was used.        ASSESSMENT   The left main artery was abnormal:4  The proximal LAD artery are abnormal:4  The mid-LAD artery was abnormal:4  The distal LAD artery was abnormal:4  The proximal circumflex artery was abnormal:4  The distal circumflex artery was abnormal:4  The first obtuse marginal branch artery (OM-1) was abnormal:4  The proximal right coronary artery (RCA) was abnormal:2  The mid right coronary artery (  RCA) was abnormal:2  The distal right coronary artery (RCA) was abnormal:2  The posterior descending coronary arteries were abnormal:3     TEST CONCLUSIONS  Quality of study: Fair.  1-Calcium score: 4193.5  2-Right dominant system.   3-Diffuse 3 vessel disease, consider cardiac Cath.     PLAN     Hyperlipidemia, unspecified Lab: [facility], Waived Lab: Creatinine [place], [place]       Adrian Blackwater MD  Electronically signed by: Adrian Blackwater     Date: 01/23/2020 10:30 REASON FOR VISIT  Visit for: Echocardiogram - Edema  Sex: Female   wt= 199 lbs.  BP= 150/90  Height= 64 inches.        TESTS  Imaging: Echocardiogram:  An echocardiogram in (2-d) mode was performed and in Doppler mode with color flow velocity mapping was performed. The aortic valve cusps are normal 1.9 cm, flow velocity was normal 1.4 m/s, and normal calculated aortic valve systolic mean flow gradient 5.0 mmHg. Mitral valve diastolic peak flow velocity E .75 m/s and E/A ratio 1.0. Aortic root diameter 2.8 cm. The LVOT internal diameter was normal 1.8 cm and flow velocity was normal 1.3 m/s. LV systolic dimension 1.8 cm, diastolic 3.8 cm, posterior wall thickness 1.1 cm, fractional shortening 51 %, and EF 83 %. IVS thickness 1.4 cm. LA dimension 5.0 cm  RIGHT atrium= 17.0 cm2. Mitral Valve =  Ea= 8.9  DT= 165 msec. Tricuspid Valve =  TR jet V= 2.5      RAP= 5  RVSP= 31.0 mmHg. Aortic Valve is Normal. Pulmonic Valve= PIEDV=  .65 m/s. Mitral Valve has Mild Regurgitation. Pulmonic Valve has Mild Regurgitation. Tricuspid Valve has Mild Regurgitation.     ASSESSMENT  Technically difficult study due to body habitus.  Moderately dilated left atrium and mildly dilated right atrium with ventricles and aorta appearing normal in size.  Normal left ventricular systolic function.  Normal right ventricular systolic function.  Normal left ventricular wall motion.  Normal right ventricular wall motion.  Mild left ventricular hypertrophy with GRADE 1 (relaxation abnormality) diastolic dysfunction.   Right ventricular diastolic dysfunction.  Trace pulmonary regurgitation.  Mild tricuspid regurgitation.   Normal pulmonary artery pressure.   Mild mitral regurgitation.  No pericardial effusion.     THERAPY   Referring physician: Laurier Nancy  Sonographer: Richardson Landry, RCS.       Adrian Blackwater MD  Electronically signed by: Adrian Blackwater     Date: 01/19/2020 10:02   Current Outpatient Medications  Medication Sig Dispense Refill   amLODipine (NORVASC) 10 MG tablet TAKE 1 TABLET BY MOUTH EVERY DAY IN THE MORNING 90 tablet 1   atorvastatin (LIPITOR) 80 MG tablet TAKE 1 TABLET BY MOUTH EVERYDAY AT BEDTIME 90 tablet 0   buPROPion (WELLBUTRIN SR) 150 MG 12 hr tablet Take 150 mg by mouth 2 (two) times daily.      butalbital-acetaminophen-caffeine (FIORICET) 50-325-40 MG tablet Take 1 tablet by mouth 2 (two) times daily as needed for headache.     clopidogrel (PLAVIX) 75 MG tablet TAKE 1 TABLET BY MOUTH EVERY DAY **MAKE FOLLOW UP APPT WITH DR. Mieczyslaw Stamas FOR REFILLS** 90 tablet 2   cyclobenzaprine (FLEXERIL) 10 MG tablet TAKE 1 TABLET BY MOUTH AT BEDTIME FOR MUSCLE RELAXANT, WILL MAKE DROWSY 30 tablet 2   ezetimibe (ZETIA) 10 MG tablet Take 10 mg by mouth daily.     levothyroxine (SYNTHROID) 200 MCG tablet TAKE 1 TABLET BY MOUTH EVERY DAY IN THE MORNING WITH 25MG  DOSE  90 tablet 0   levothyroxine (SYNTHROID) 25 MCG tablet Take 25 mcg by mouth daily.     metoprolol succinate (TOPROL-XL) 25 MG 24 hr tablet Take 25 mg by mouth daily.     nitroGLYCERIN (NITROSTAT) 0.4 MG SL tablet Place 1 tablet (0.4 mg total) under the tongue every 5 (five) minutes as needed for chest pain. 10 tablet 0   pantoprazole (PROTONIX) 40 MG tablet Take 40 mg by mouth daily as needed (acid reflux).      ramipril (ALTACE) 10 MG capsule TAKE 1 CAPSULE BY MOUTH EVERY DAY IN THE MORNING 90 capsule 1   traMADol (ULTRAM) 50 MG tablet Take 1 tablet (50 mg total) by mouth in the morning, at noon, and at bedtime. 90 tablet 0   traZODone (DESYREL) 50 MG tablet Take 100 mg by mouth at bedtime as needed for sleep.     Current Facility-Administered Medications  Medication Dose Route Frequency Provider Last Rate Last Admin   iohexol (OMNIPAQUE) 180 MG/ML injection 20 mL  20 mL Other Once PRN Yevette Edwards, MD   4 mL  at 12/05/15 1545   iopamidol (ISOVUE-M) 41 % intrathecal injection 20 mL  20 mL Other Once PRN Yevette Edwards, MD       iopamidol (ISOVUE-M) 41 % intrathecal injection 20 mL  20 mL Other Once PRN Yevette Edwards, MD       iopamidol (ISOVUE-M) 41 % intrathecal injection 20 mL  20 mL Other Once PRN Yevette Edwards, MD       lactated ringers infusion 1,000 mL  1,000 mL Intravenous Continuous Yevette Edwards, MD       sodium chloride flush (NS) 0.9 % injection 10 mL  10 mL Other Once Yevette Edwards, MD       sodium chloride flush (NS) 0.9 % injection 10 mL  10 mL Other Once Yevette Edwards, MD       Facility-Administered Medications Ordered in Other Visits  Medication Dose Route Frequency Provider Last Rate Last Admin   sodium chloride flush (NS) 0.9 % injection 3 mL  3 mL Intravenous Q12H Laurier Nancy, MD        Allergies:   Hydralazine hcl    Social History:   reports that she quit smoking about 22 years ago. Her smoking use included cigarettes. She has a 10.00 pack-year smoking history. She has never used smokeless tobacco. She reports that she does not drink alcohol and does not use drugs.   Family History:  family history includes Diabetes in her mother; Heart attack in her father; Heart failure in her mother; Hyperlipidemia in her mother.    ROS:     Review of Systems  Constitutional: Negative.   HENT: Negative.    Eyes: Negative.   Respiratory: Negative.    Gastrointestinal: Negative.   Genitourinary: Negative.   Musculoskeletal: Negative.   Skin: Negative.   Neurological: Negative.   Endo/Heme/Allergies: Negative.   Psychiatric/Behavioral: Negative.    All other systems reviewed and are negative.     All other systems are reviewed and negative.    PHYSICAL EXAM: VS:  BP (!) 138/90   Pulse 73   Ht 5\' 2"  (1.575 m)   Wt 174 lb 6.4 oz (79.1 kg)   SpO2 96%   BMI 31.90 kg/m  , BMI Body mass index is 31.9 kg/m. Last weight:  Wt Readings from Last 3 Encounters:   03/20/23 174 lb 6.4 oz (79.1 kg)  02/09/23 172 lb 12.8 oz (78.4 kg)  12/17/22 175 lb (79.4 kg)     Physical Exam Constitutional:      Appearance: Normal appearance.  Cardiovascular:     Rate and Rhythm: Normal rate and regular rhythm.     Heart sounds: Normal heart sounds.  Pulmonary:     Effort: Pulmonary effort is normal.     Breath sounds: Normal breath sounds.  Musculoskeletal:     Right lower leg: No edema.     Left lower leg: No edema.  Neurological:     Mental Status: She is alert.       EKG:   Recent Labs: 02/09/2023: TSH 2.370    Lipid Panel    Component Value Date/Time   CHOL 155 02/09/2023 1621   CHOL 222 (H) 03/31/2013 0444   TRIG 115 02/09/2023 1621   TRIG 198 03/31/2013 0444   HDL 53 02/09/2023 1621   HDL 45 03/31/2013 0444   CHOLHDL 4.1 08/07/2009 0425   VLDL 40 03/31/2013 0444   LDLCALC 81 02/09/2023 1621   LDLCALC 137 (H) 03/31/2013 0444      Other studies Reviewed: Additional studies/ records that were reviewed today include:  Review of the above records demonstrates:       No data to display            ASSESSMENT AND PLAN:    ICD-10-CM   1. Coronary artery disease of native artery of native heart with stable angina pectoris (HCC)  I25.118 MYOCARDIAL PERFUSION IMAGING    PCV ECHOCARDIOGRAM COMPLETE   Has stents and had no w/u 3 years, advise echo, stress test    2. SOB (shortness of breath)  R06.02 MYOCARDIAL PERFUSION IMAGING    PCV ECHOCARDIOGRAM COMPLETE    3. Primary hypertension  I10 MYOCARDIAL PERFUSION IMAGING    PCV ECHOCARDIOGRAM COMPLETE    4. Nonrheumatic mitral valve regurgitation  I34.0 MYOCARDIAL PERFUSION IMAGING    PCV ECHOCARDIOGRAM COMPLETE       Problem List Items Addressed This Visit       Cardiovascular and Mediastinum   CAD (coronary artery disease) - Primary   Relevant Orders   MYOCARDIAL PERFUSION IMAGING   PCV ECHOCARDIOGRAM COMPLETE   Other Visit Diagnoses     SOB (shortness of breath)        Relevant Orders   MYOCARDIAL PERFUSION IMAGING   PCV ECHOCARDIOGRAM COMPLETE   Primary hypertension       Relevant Orders   MYOCARDIAL PERFUSION IMAGING   PCV ECHOCARDIOGRAM COMPLETE   Nonrheumatic mitral valve regurgitation       Relevant Orders   MYOCARDIAL PERFUSION IMAGING   PCV ECHOCARDIOGRAM COMPLETE          Disposition:   Return in about 4 weeks (around 04/17/2023) for echo, stress test and f/u.    Total time spent: 30 minutes  Signed,  Adrian Blackwater, MD  03/20/2023 10:51 AM    [facility]

## 2023-03-27 ENCOUNTER — Other Ambulatory Visit: Payer: Self-pay | Admitting: Internal Medicine

## 2023-03-27 DIAGNOSIS — E039 Hypothyroidism, unspecified: Secondary | ICD-10-CM

## 2023-04-06 ENCOUNTER — Ambulatory Visit: Payer: Self-pay | Admitting: Internal Medicine

## 2023-04-06 VITALS — BP 124/78 | HR 83 | Ht 62.0 in | Wt 173.0 lb

## 2023-04-06 DIAGNOSIS — I25118 Atherosclerotic heart disease of native coronary artery with other forms of angina pectoris: Secondary | ICD-10-CM | POA: Diagnosis not present

## 2023-04-06 DIAGNOSIS — G8929 Other chronic pain: Secondary | ICD-10-CM

## 2023-04-06 DIAGNOSIS — E039 Hypothyroidism, unspecified: Secondary | ICD-10-CM | POA: Diagnosis not present

## 2023-04-06 MED ORDER — TRAMADOL HCL 50 MG PO TABS
50.0000 mg | ORAL_TABLET | Freq: Three times a day (TID) | ORAL | 0 refills | Status: DC
Start: 2023-04-06 — End: 2023-05-06

## 2023-04-06 MED ORDER — TRAMADOL HCL 50 MG PO TABS
50.0000 mg | ORAL_TABLET | Freq: Three times a day (TID) | ORAL | 0 refills | Status: DC | PRN
Start: 2023-05-06 — End: 2023-06-05

## 2023-04-06 NOTE — Progress Notes (Signed)
Established Patient Office Visit  Subjective:  Patient ID: Christina Zamora, female    DOB: 30-Dec-1960  Age: 62 y.o. MRN: [id number]  Chief Complaint  Patient presents with   Follow-up    PM    Here for pain management follow up. Chronic pain well controlled on current analgesia. Last UDS satisfactory and pill counts have also been satisfactory.     No other concerns at this time.   Past Medical History:  Diagnosis Date   Arthritis    CAD (coronary artery disease)    Chronic headaches    GERD (gastroesophageal reflux disease)    Hyperlipidemia    Hypertension    Insomnia    Lumbago    Pulmonary HTN (HCC)    Thyroid disease    Vitamin D deficiency     Past Surgical History:  Procedure Laterality Date   BILATERAL CARPAL TUNNEL RELEASE  2000   BREAST EXCISIONAL BIOPSY Right 06/24/2010   surgical bx fat necrosis   CORONARY ANGIOGRAPHY N/A 05/29/2017   Procedure: CORONARY ANGIOGRAPHY;  Surgeon: Laurier Nancy, MD;  Location: [facility] INVASIVE CV LAB;  Service: Cardiovascular;  Laterality: N/A;   CORONARY ANGIOPLASTY WITH STENT PLACEMENT     LEFT HEART CATH Left 05/29/2017   Procedure: Left Heart Cath;  Surgeon: Laurier Nancy, MD;  Location: [facility] INVASIVE CV LAB;  Service: Cardiovascular;  Laterality: Left;   LEFT HEART CATH AND CORONARY ANGIOGRAPHY Left 01/27/2020   Procedure: LEFT HEART CATH AND CORONARY ANGIOGRAPHY;  Surgeon: Laurier Nancy, MD;  Location: [facility] INVASIVE CV LAB;  Service: Cardiovascular;  Laterality: Left;   REPLACEMENT TOTAL KNEE Left 2015    Social History   Socioeconomic History   Marital status: Married    Spouse name: Not on file   Number of children: Not on file   Years of education: Not on file   Highest education level: Not on file  Occupational History   Not on file  Tobacco Use   Smoking status: Former    Current packs/day: 0.00    Average packs/day: 0.5 packs/day for 20.0 years (10.0 ttl pk-yrs)    Types: Cigarettes    Start date:  22    Quit date: 2002    Years since quitting: 22.5   Smokeless tobacco: Never  Vaping Use   Vaping status: Never Used  Substance and Sexual Activity   Alcohol use: No    Alcohol/week: 0.0 standard drinks of alcohol   Drug use: No   Sexual activity: Not on file  Other Topics Concern   Not on file  Social History Narrative   Not on file   Social Determinants of Health   Financial Resource Strain: Not on file  Food Insecurity: Not on file  Transportation Needs: Not on file  Physical Activity: Not on file  Stress: Not on file  Social Connections: Not on file  Intimate Partner Violence: Not on file    Family History  Problem Relation Age of Onset   Heart failure Mother    Diabetes Mother    Hyperlipidemia Mother    Heart attack Father     Allergies  Allergen Reactions   Hydralazine Hcl Swelling and Palpitations    Review of Systems  Constitutional: Negative.   HENT: Negative.    Eyes: Negative.   Respiratory: Negative.    Cardiovascular: Negative.   Gastrointestinal: Negative.   Genitourinary: Negative.   Skin: Negative.   Neurological: Negative.   Endo/Heme/Allergies: Negative.  Objective:   BP 124/78   Pulse 83   Ht 5\' 2"  (1.575 m)   Wt 173 lb (78.5 kg)   SpO2 99%   BMI 31.64 kg/m   Vitals:   04/06/23 1046  BP: 124/78  Pulse: 83  Height: 5\' 2"  (1.575 m)  Weight: 173 lb (78.5 kg)  SpO2: 99%  BMI (Calculated): 31.63    Physical Exam Vitals reviewed.  Constitutional:      General: She is not in acute distress.    Appearance: She is obese.  HENT:     Head: Normocephalic.     Nose: Nose normal.     Mouth/Throat:     Mouth: Mucous membranes are moist.  Eyes:     Extraocular Movements: Extraocular movements intact.     Pupils: Pupils are equal, round, and reactive to light.  Cardiovascular:     Rate and Rhythm: Normal rate and regular rhythm.     Heart sounds: No murmur heard. Pulmonary:     Effort: Pulmonary effort is normal.      Breath sounds: No rhonchi or rales.  Abdominal:     General: Abdomen is flat.     Palpations: There is no hepatomegaly, splenomegaly or mass.  Musculoskeletal:        General: Normal range of motion.     Cervical back: Normal range of motion. No tenderness.  Skin:    General: Skin is warm and dry.  Neurological:     General: No focal deficit present.     Mental Status: She is alert and oriented to person, place, and time.     Cranial Nerves: No cranial nerve deficit.     Motor: No weakness.  Psychiatric:        Mood and Affect: Mood normal.        Behavior: Behavior normal.      No results found for any visits on 04/06/23.      Assessment & Plan:   Problem List Items Addressed This Visit   None   No follow-ups on file.   Total time spent: 20 minutes  Luna Fuse, MD  04/06/2023   This document may have been prepared by [facility] Voice Recognition software and as such may include unintentional dictation errors.

## 2023-05-18 ENCOUNTER — Other Ambulatory Visit: Payer: Self-pay | Admitting: Cardiovascular Disease

## 2023-05-18 DIAGNOSIS — I2510 Atherosclerotic heart disease of native coronary artery without angina pectoris: Secondary | ICD-10-CM

## 2023-05-27 ENCOUNTER — Other Ambulatory Visit: Payer: Self-pay | Admitting: Cardiovascular Disease

## 2023-05-27 DIAGNOSIS — I2510 Atherosclerotic heart disease of native coronary artery without angina pectoris: Secondary | ICD-10-CM

## 2023-06-05 ENCOUNTER — Ambulatory Visit: Payer: Self-pay | Admitting: Internal Medicine

## 2023-06-05 ENCOUNTER — Encounter: Payer: Self-pay | Admitting: Internal Medicine

## 2023-06-05 DIAGNOSIS — G8929 Other chronic pain: Secondary | ICD-10-CM | POA: Diagnosis not present

## 2023-06-05 MED ORDER — TRAMADOL HCL 50 MG PO TABS
50.0000 mg | ORAL_TABLET | Freq: Three times a day (TID) | ORAL | 0 refills | Status: DC | PRN
Start: 2023-06-05 — End: 2023-07-05

## 2023-06-05 MED ORDER — TRAMADOL HCL 50 MG PO TABS
50.0000 mg | ORAL_TABLET | Freq: Three times a day (TID) | ORAL | 0 refills | Status: DC | PRN
Start: 2023-07-04 — End: 2023-07-06

## 2023-06-05 NOTE — Progress Notes (Signed)
Established Patient Office Visit  Subjective:  Patient ID: Christina Zamora, female    DOB: 1961/02/16  Age: 62 y.o. MRN: [id number]  Chief Complaint  Patient presents with   Follow-up    2 mo     Here for pain management follow up. Chronic pain well controlled on current analgesia. Last UDS satisfactory and pill counts have also been satisfactory.   No other concerns at this time.   Past Medical History:  Diagnosis Date   Arthritis    CAD (coronary artery disease)    Chronic headaches    GERD (gastroesophageal reflux disease)    Hyperlipidemia    Hypertension    Insomnia    Lumbago    Pulmonary HTN (HCC)    Thyroid disease    Vitamin D deficiency     Past Surgical History:  Procedure Laterality Date   BILATERAL CARPAL TUNNEL RELEASE  2000   BREAST EXCISIONAL BIOPSY Right 06/24/2010   surgical bx fat necrosis   CORONARY ANGIOGRAPHY N/A 05/29/2017   Procedure: CORONARY ANGIOGRAPHY;  Surgeon: Laurier Nancy, MD;  Location: [facility] INVASIVE CV LAB;  Service: Cardiovascular;  Laterality: N/A;   CORONARY ANGIOPLASTY WITH STENT PLACEMENT     LEFT HEART CATH Left 05/29/2017   Procedure: Left Heart Cath;  Surgeon: Laurier Nancy, MD;  Location: [facility] INVASIVE CV LAB;  Service: Cardiovascular;  Laterality: Left;   LEFT HEART CATH AND CORONARY ANGIOGRAPHY Left 01/27/2020   Procedure: LEFT HEART CATH AND CORONARY ANGIOGRAPHY;  Surgeon: Laurier Nancy, MD;  Location: [facility] INVASIVE CV LAB;  Service: Cardiovascular;  Laterality: Left;   REPLACEMENT TOTAL KNEE Left 2015    Social History   Socioeconomic History   Marital status: Married    Spouse name: Not on file   Number of children: Not on file   Years of education: Not on file   Highest education level: Not on file  Occupational History   Not on file  Tobacco Use   Smoking status: Former    Current packs/day: 0.00    Average packs/day: 0.5 packs/day for 20.0 years (10.0 ttl pk-yrs)    Types: Cigarettes    Start date: 36     Quit date: 2002    Years since quitting: 22.7   Smokeless tobacco: Never  Vaping Use   Vaping status: Never Used  Substance and Sexual Activity   Alcohol use: No    Alcohol/week: 0.0 standard drinks of alcohol   Drug use: No   Sexual activity: Not on file  Other Topics Concern   Not on file  Social History Narrative   Not on file   Social Determinants of Health   Financial Resource Strain: Not on file  Food Insecurity: Not on file  Transportation Needs: Not on file  Physical Activity: Not on file  Stress: Not on file  Social Connections: Not on file  Intimate Partner Violence: Not on file    Family History  Problem Relation Age of Onset   Heart failure Mother    Diabetes Mother    Hyperlipidemia Mother    Heart attack Father     Allergies  Allergen Reactions   Hydralazine Hcl Swelling and Palpitations    Review of Systems  Constitutional: Negative.   HENT: Negative.    Eyes: Negative.   Respiratory: Negative.    Cardiovascular: Negative.   Gastrointestinal: Negative.   Genitourinary: Negative.   Skin: Negative.   Neurological: Negative.   Endo/Heme/Allergies: Negative.  Objective:   BP 125/80   Pulse 71   Ht 5\' 2"  (1.575 m)   Wt 178 lb 6.4 oz (80.9 kg)   SpO2 98%   BMI 32.63 kg/m   Vitals:   06/05/23 1115  BP: 125/80  Pulse: 71  Height: 5\' 2"  (1.575 m)  Weight: 178 lb 6.4 oz (80.9 kg)  SpO2: 98%  BMI (Calculated): 32.62    Physical Exam Vitals reviewed.  Constitutional:      General: She is not in acute distress.    Appearance: She is obese.  HENT:     Head: Normocephalic.     Nose: Nose normal.     Mouth/Throat:     Mouth: Mucous membranes are moist.  Eyes:     Extraocular Movements: Extraocular movements intact.     Pupils: Pupils are equal, round, and reactive to light.  Cardiovascular:     Rate and Rhythm: Normal rate and regular rhythm.     Heart sounds: No murmur heard. Pulmonary:     Effort: Pulmonary effort  is normal.     Breath sounds: No rhonchi or rales.  Abdominal:     General: Abdomen is flat.     Palpations: There is no hepatomegaly, splenomegaly or mass.  Musculoskeletal:        General: Normal range of motion.     Cervical back: Normal range of motion. No tenderness.  Skin:    General: Skin is warm and dry.  Neurological:     General: No focal deficit present.     Mental Status: She is alert and oriented to person, place, and time.     Cranial Nerves: No cranial nerve deficit.     Motor: No weakness.  Psychiatric:        Mood and Affect: Mood normal.        Behavior: Behavior normal.      No results found for any visits on 06/05/23.  No results found for this or any previous visit (from the past 2160 hour(Marayah Higdon)).    Assessment & Plan:  As per problem list. Problem List Items Addressed This Visit       Other   Other chronic pain   Relevant Medications   traMADol (ULTRAM) 50 MG tablet   traMADol (ULTRAM) 50 MG tablet (Start on 07/04/2023)    Return in about 2 months (around 08/05/2023) for Pain management.   Total time spent: 20 minutes  Luna Fuse, MD  06/05/2023   This document may have been prepared by [facility] Voice Recognition software and as such may include unintentional dictation errors.

## 2023-06-07 ENCOUNTER — Other Ambulatory Visit: Payer: Self-pay | Admitting: Internal Medicine

## 2023-06-07 DIAGNOSIS — E039 Hypothyroidism, unspecified: Secondary | ICD-10-CM

## 2023-06-08 ENCOUNTER — Other Ambulatory Visit: Payer: Self-pay | Admitting: Internal Medicine

## 2023-06-08 DIAGNOSIS — I2510 Atherosclerotic heart disease of native coronary artery without angina pectoris: Secondary | ICD-10-CM

## 2023-06-13 ENCOUNTER — Other Ambulatory Visit: Payer: Self-pay | Admitting: Internal Medicine

## 2023-06-13 DIAGNOSIS — M5430 Sciatica, unspecified side: Secondary | ICD-10-CM

## 2023-07-06 ENCOUNTER — Other Ambulatory Visit: Payer: Self-pay

## 2023-07-06 DIAGNOSIS — G8929 Other chronic pain: Secondary | ICD-10-CM

## 2023-07-06 MED ORDER — TRAMADOL HCL 50 MG PO TABS
50.0000 mg | ORAL_TABLET | Freq: Three times a day (TID) | ORAL | 0 refills | Status: DC | PRN
Start: 2023-07-06 — End: 2023-08-05

## 2023-08-05 ENCOUNTER — Ambulatory Visit: Payer: Self-pay | Admitting: Internal Medicine

## 2023-08-05 VITALS — BP 118/81 | HR 70 | Ht 62.0 in | Wt 177.6 lb

## 2023-08-05 DIAGNOSIS — G8929 Other chronic pain: Secondary | ICD-10-CM

## 2023-08-05 DIAGNOSIS — E039 Hypothyroidism, unspecified: Secondary | ICD-10-CM

## 2023-08-05 DIAGNOSIS — I25118 Atherosclerotic heart disease of native coronary artery with other forms of angina pectoris: Secondary | ICD-10-CM

## 2023-08-05 MED ORDER — TRAMADOL HCL 50 MG PO TABS: 50.0000 mg | ORAL_TABLET | Freq: Three times a day (TID) | ORAL | 0 refills | Status: DC | PRN

## 2023-08-05 NOTE — Progress Notes (Signed)
Established Patient Office Visit  Subjective:  Patient ID: Christina Zamora, female    DOB: [birth year]  Age: 62 y.o. MRN: [id number]  Chief Complaint  Patient presents with   Pain Management    Here for pain management follow up. Chronic pain well controlled on current analgesia. Last UDS satisfactory but didn't bring pill bottle.   No other concerns at this time.   Past Medical History:  Diagnosis Date   Arthritis    CAD (coronary artery disease)    Chronic headaches    GERD (gastroesophageal reflux disease)    Hyperlipidemia    Hypertension    Insomnia    Lumbago    Pulmonary HTN (HCC)    Thyroid disease    Vitamin D deficiency     Past Surgical History:  Procedure Laterality Date   BILATERAL CARPAL TUNNEL RELEASE  2000   BREAST EXCISIONAL BIOPSY Right 06/24/2010   surgical bx fat necrosis   CORONARY ANGIOGRAPHY N/A 05/29/2017   Procedure: CORONARY ANGIOGRAPHY;  Surgeon: Laurier Nancy, MD;  Location: [facility] INVASIVE CV LAB;  Service: Cardiovascular;  Laterality: N/A;   CORONARY ANGIOPLASTY WITH STENT PLACEMENT     LEFT HEART CATH Left 05/29/2017   Procedure: Left Heart Cath;  Surgeon: Laurier Nancy, MD;  Location: [facility] INVASIVE CV LAB;  Service: Cardiovascular;  Laterality: Left;   LEFT HEART CATH AND CORONARY ANGIOGRAPHY Left 01/27/2020   Procedure: LEFT HEART CATH AND CORONARY ANGIOGRAPHY;  Surgeon: Laurier Nancy, MD;  Location: [facility] INVASIVE CV LAB;  Service: Cardiovascular;  Laterality: Left;   REPLACEMENT TOTAL KNEE Left 2015    Social History   Socioeconomic History   Marital status: Married    Spouse name: Not on file   Number of children: Not on file   Years of education: Not on file   Highest education level: Not on file  Occupational History   Not on file  Tobacco Use   Smoking status: Former    Current packs/day: 0.00    Average packs/day: 0.5 packs/day for 20.0 years (10.0 ttl pk-yrs)    Types: Cigarettes    Start date: 89    Quit date: 2002     Years since quitting: 22.8   Smokeless tobacco: Never  Vaping Use   Vaping status: Never Used  Substance and Sexual Activity   Alcohol use: No    Alcohol/week: 0.0 standard drinks of alcohol   Drug use: No   Sexual activity: Not on file  Other Topics Concern   Not on file  Social History Narrative   Not on file   Social Determinants of Health   Financial Resource Strain: Not on file  Food Insecurity: Not on file  Transportation Needs: Not on file  Physical Activity: Not on file  Stress: Not on file  Social Connections: Not on file  Intimate Partner Violence: Not on file    Family History  Problem Relation Age of Onset   Heart failure Mother    Diabetes Mother    Hyperlipidemia Mother    Heart attack Father     Allergies  Allergen Reactions   Hydralazine Hcl Swelling and Palpitations    Review of Systems  Constitutional: Negative.   HENT: Negative.    Eyes: Negative.   Respiratory: Negative.    Cardiovascular: Negative.   Gastrointestinal: Negative.   Genitourinary: Negative.   Skin: Negative.   Neurological: Negative.   Endo/Heme/Allergies: Negative.        Objective:   BP  118/81   Pulse 70   Ht 5\' 2"  (1.575 m)   Wt 177 lb 9.6 oz (80.6 kg)   SpO2 93%   BMI 32.48 kg/m   Vitals:   08/05/23 0924  BP: 118/81  Pulse: 70  Height: 5\' 2"  (1.575 m)  Weight: 177 lb 9.6 oz (80.6 kg)  SpO2: 93%  BMI (Calculated): 32.48    Physical Exam Vitals reviewed.  Constitutional:      General: She is not in acute distress.    Appearance: She is obese.  HENT:     Head: Normocephalic.     Nose: Nose normal.     Mouth/Throat:     Mouth: Mucous membranes are moist.  Eyes:     Extraocular Movements: Extraocular movements intact.     Pupils: Pupils are equal, round, and reactive to light.  Cardiovascular:     Rate and Rhythm: Normal rate and regular rhythm.     Heart sounds: No murmur heard. Pulmonary:     Effort: Pulmonary effort is normal.      Breath sounds: No rhonchi or rales.  Abdominal:     General: Abdomen is flat.     Palpations: There is no hepatomegaly, splenomegaly or mass.  Musculoskeletal:        General: Normal range of motion.     Cervical back: Normal range of motion. No tenderness.  Skin:    General: Skin is warm and dry.  Neurological:     General: No focal deficit present.     Mental Status: She is alert and oriented to person, place, and time.     Cranial Nerves: No cranial nerve deficit.     Motor: No weakness.  Psychiatric:        Mood and Affect: Mood normal.        Behavior: Behavior normal.      No results found for any visits on 08/05/23.  No results found for this or any previous visit (from the past 2160 hour(Clelia Trabucco)).    Assessment & Plan:  As per problem list  Problem List Items Addressed This Visit       Cardiovascular and Mediastinum   CAD (coronary artery disease)     Endocrine   Acquired hypothyroidism     Other   Other chronic pain - Primary    Return in about 2 months (around 10/05/2023) for Pain Management, fu with labs prior.   Total time spent: 20 minutes  Luna Fuse, MD  08/05/2023   This document may have been prepared by [facility] Voice Recognition software and as such may include unintentional dictation errors.

## 2023-09-04 ENCOUNTER — Other Ambulatory Visit: Payer: Self-pay

## 2023-09-04 DIAGNOSIS — G8929 Other chronic pain: Secondary | ICD-10-CM

## 2023-09-04 MED ORDER — TRAMADOL HCL 50 MG PO TABS: 50.0000 mg | ORAL_TABLET | Freq: Three times a day (TID) | ORAL | 0 refills | Status: DC | PRN

## 2023-09-06 ENCOUNTER — Other Ambulatory Visit: Payer: Self-pay | Admitting: Internal Medicine

## 2023-09-06 DIAGNOSIS — E039 Hypothyroidism, unspecified: Secondary | ICD-10-CM

## 2023-09-21 ENCOUNTER — Other Ambulatory Visit: Payer: Self-pay | Admitting: Internal Medicine

## 2023-09-21 ENCOUNTER — Other Ambulatory Visit: Payer: Self-pay

## 2023-09-21 DIAGNOSIS — E039 Hypothyroidism, unspecified: Secondary | ICD-10-CM

## 2023-09-21 DIAGNOSIS — I2510 Atherosclerotic heart disease of native coronary artery without angina pectoris: Secondary | ICD-10-CM

## 2023-10-05 ENCOUNTER — Ambulatory Visit: Payer: Self-pay | Admitting: Internal Medicine

## 2023-10-05 VITALS — BP 152/90 | HR 76 | Temp 98.0°F | Ht 62.0 in | Wt 179.0 lb

## 2023-10-05 DIAGNOSIS — I25118 Atherosclerotic heart disease of native coronary artery with other forms of angina pectoris: Secondary | ICD-10-CM

## 2023-10-05 DIAGNOSIS — E039 Hypothyroidism, unspecified: Secondary | ICD-10-CM

## 2023-10-05 DIAGNOSIS — G8929 Other chronic pain: Secondary | ICD-10-CM

## 2023-10-05 MED ORDER — TRAMADOL HCL 50 MG PO TABS: 50.0000 mg | ORAL_TABLET | Freq: Three times a day (TID) | ORAL | 0 refills | Status: DC | PRN

## 2023-10-05 NOTE — Progress Notes (Signed)
 Established Patient Office Visit  Subjective:  Patient ID: Christina Zamora, female    DOB: [birth year]  Age: 63 y.o. MRN: [id number]  Chief Complaint  Patient presents with   Pain Management    PM     Here for pain management follow up. Chronic pain well controlled on current analgesia. Last UDS satisfactory and pill counts have also been satisfactory.     No other concerns at this time.   Past Medical History:  Diagnosis Date   Arthritis    CAD (coronary artery disease)    Chronic headaches    GERD (gastroesophageal reflux disease)    Hyperlipidemia    Hypertension    Insomnia    Lumbago    Pulmonary HTN (HCC)    Thyroid disease    Vitamin D deficiency     Past Surgical History:  Procedure Laterality Date   BILATERAL CARPAL TUNNEL RELEASE  2000   BREAST EXCISIONAL BIOPSY Right 06/24/2010   surgical bx fat necrosis   CORONARY ANGIOGRAPHY N/A 05/29/2017   Procedure: CORONARY ANGIOGRAPHY;  Surgeon: Fernand Denyse LABOR, MD;  Location: [facility] INVASIVE CV LAB;  Service: Cardiovascular;  Laterality: N/A;   CORONARY ANGIOPLASTY WITH STENT PLACEMENT     LEFT HEART CATH Left 05/29/2017   Procedure: Left Heart Cath;  Surgeon: Fernand Denyse LABOR, MD;  Location: [facility] INVASIVE CV LAB;  Service: Cardiovascular;  Laterality: Left;   LEFT HEART CATH AND CORONARY ANGIOGRAPHY Left 01/27/2020   Procedure: LEFT HEART CATH AND CORONARY ANGIOGRAPHY;  Surgeon: Fernand Denyse LABOR, MD;  Location: [facility] INVASIVE CV LAB;  Service: Cardiovascular;  Laterality: Left;   REPLACEMENT TOTAL KNEE Left 2015    Social History   Socioeconomic History   Marital status: Married    Spouse name: Not on file   Number of children: Not on file   Years of education: Not on file   Highest education level: Not on file  Occupational History   Not on file  Tobacco Use   Smoking status: Former    Current packs/day: 0.00    Average packs/day: 0.5 packs/day for 20.0 years (10.0 ttl pk-yrs)    Types: Cigarettes    Start  date: 18    Quit date: 2002    Years since quitting: 23.0   Smokeless tobacco: Never  Vaping Use   Vaping status: Never Used  Substance and Sexual Activity   Alcohol use: No    Alcohol/week: 0.0 standard drinks of alcohol   Drug use: No   Sexual activity: Not on file  Other Topics Concern   Not on file  Social History Narrative   Not on file   Social Drivers of Health   Financial Resource Strain: Not on file  Food Insecurity: Not on file  Transportation Needs: Not on file  Physical Activity: Not on file  Stress: Not on file  Social Connections: Not on file  Intimate Partner Violence: Not on file    Family History  Problem Relation Age of Onset   Heart failure Mother    Diabetes Mother    Hyperlipidemia Mother    Heart attack Father     Allergies  Allergen Reactions   Hydralazine  Hcl Swelling and Palpitations    Outpatient Medications Prior to Visit  Medication Sig   amLODipine  (NORVASC ) 10 MG tablet TAKE 1 TABLET BY MOUTH EVERY DAY IN THE MORNING   atorvastatin  (LIPITOR) 80 MG tablet TAKE 1 TABLET BY MOUTH EVERYDAY AT BEDTIME   buPROPion  (WELLBUTRIN  SR) 150  MG 12 hr tablet TAKE 1 TABLET BY MOUTH TWICE A DAY   butalbital-acetaminophen -caffeine (FIORICET) 50-325-40 MG tablet Take 1 tablet by mouth 2 (two) times daily as needed for headache.   clopidogrel  (PLAVIX ) 75 MG tablet TAKE 1 TABLET BY MOUTH EVERY DAY **MAKE FOLLOW UP APPT WITH DR. KHAN FOR REFILLS**   cyclobenzaprine (FLEXERIL) 10 MG tablet TAKE 1 TABLET BY MOUTH AT BEDTIME FOR MUSCLE RELAXANT **WILL MAKE DROWSY   levothyroxine  (SYNTHROID ) 200 MCG tablet TAKE 1 TABLET BY MOUTH EVERY DAY IN THE MORNING WITH 25MG  DOSE   levothyroxine  (SYNTHROID ) 25 MCG tablet TAKE 1 TABLET BY MOUTH EVERY DAY IN THE MORNING WITH 200 MCG TABLET   metoprolol succinate (TOPROL-XL) 25 MG 24 hr tablet TAKE 1 TABLET BY MOUTH EVERY DAY **MAKE FOLLOW UP APPT WITH DR. KHAN FOR REFILLS**   nitroGLYCERIN  (NITROSTAT ) 0.4 MG SL tablet Place  1 tablet (0.4 mg total) under the tongue every 5 (five) minutes as needed for chest pain.   pantoprazole  (PROTONIX ) 40 MG tablet Take 40 mg by mouth daily as needed (acid reflux).    ramipril  (ALTACE ) 10 MG capsule TAKE 1 CAPSULE BY MOUTH EVERY DAY IN THE MORNING   [DISCONTINUED] traMADol  (ULTRAM ) 50 MG tablet Take 1 tablet (50 mg total) by mouth 3 (three) times daily as needed.   Facility-Administered Medications Prior to Visit  Medication Dose Route Frequency Provider   iohexol  (OMNIPAQUE ) 180 MG/ML injection 20 mL  20 mL Other Once PRN Myra Lynwood MATSU, MD   iopamidol  (ISOVUE -M) 41 % intrathecal injection 20 mL  20 mL Other Once PRN Myra Lynwood MATSU, MD   iopamidol  (ISOVUE -M) 41 % intrathecal injection 20 mL  20 mL Other Once PRN Myra Lynwood MATSU, MD   iopamidol  (ISOVUE -M) 41 % intrathecal injection 20 mL  20 mL Other Once PRN Myra Lynwood MATSU, MD   lactated ringers  infusion 1,000 mL  1,000 mL Intravenous Continuous Myra Lynwood MATSU, MD   sodium chloride  flush (NS) 0.9 % injection 10 mL  10 mL Other Once Myra Lynwood MATSU, MD   sodium chloride  flush (NS) 0.9 % injection 10 mL  10 mL Other Once Myra Lynwood MATSU, MD   sodium chloride  flush (NS) 0.9 % injection 3 mL  3 mL Intravenous Q12H Fernand Denyse LABOR, MD    Review of Systems  Constitutional: Negative.   HENT: Negative.    Eyes: Negative.   Respiratory: Negative.    Cardiovascular: Negative.   Gastrointestinal: Negative.   Genitourinary: Negative.   Skin: Negative.   Neurological: Negative.   Endo/Heme/Allergies: Negative.        Objective:   BP (!) 152/90   Pulse 76   Temp 98 F (36.7 C) (Tympanic)   Ht 5' 2 (1.575 m)   Wt 179 lb (81.2 kg)   SpO2 97%   BMI 32.74 kg/m   Vitals:   10/05/23 0951  BP: (!) 152/90  Pulse: 76  Temp: 98 F (36.7 C)  Height: 5' 2 (1.575 m)  Weight: 179 lb (81.2 kg)  SpO2: 97%  TempSrc: Tympanic  BMI (Calculated): 32.73    Physical Exam Vitals reviewed.  Constitutional:      General: She is  not in acute distress.    Appearance: She is obese.  HENT:     Head: Normocephalic.     Nose: Nose normal.     Mouth/Throat:     Mouth: Mucous membranes are moist.  Eyes:     Extraocular Movements: Extraocular movements intact.  Pupils: Pupils are equal, round, and reactive to light.  Cardiovascular:     Rate and Rhythm: Normal rate and regular rhythm.     Heart sounds: No murmur heard. Pulmonary:     Effort: Pulmonary effort is normal.     Breath sounds: No rhonchi or rales.  Abdominal:     General: Abdomen is flat.     Palpations: There is no hepatomegaly, splenomegaly or mass.  Musculoskeletal:        General: Normal range of motion.     Cervical back: Normal range of motion. No tenderness.  Skin:    General: Skin is warm and dry.  Neurological:     General: No focal deficit present.     Mental Status: She is alert and oriented to person, place, and time.     Cranial Nerves: No cranial nerve deficit.     Motor: No weakness.  Psychiatric:        Mood and Affect: Mood normal.        Behavior: Behavior normal.      No results found for any visits on 10/05/23.  No results found for this or any previous visit (from the past 2160 hours).    Assessment & Plan:  As per problem list.  Problem List Items Addressed This Visit       Cardiovascular and Mediastinum   CAD (coronary artery disease)   Relevant Orders   Lipid panel     Endocrine   Acquired hypothyroidism   Relevant Orders   TSH   Comprehensive metabolic panel     Other   Other chronic pain - Primary   Relevant Medications   traMADol  (ULTRAM ) 50 MG tablet (Start on 11/05/2023)   traMADol  (ULTRAM ) 50 MG tablet    Return in about 2 months (around 12/03/2023) for fu with labs prior.   Total time spent: 20 minutes  Sherrill Cinderella Perry, MD  10/05/2023   This document may have been prepared by [facility] Voice Recognition software and as such may include unintentional dictation errors.

## 2023-10-17 ENCOUNTER — Other Ambulatory Visit: Payer: Self-pay | Admitting: Internal Medicine

## 2023-10-17 DIAGNOSIS — M5430 Sciatica, unspecified side: Secondary | ICD-10-CM

## 2023-11-03 ENCOUNTER — Other Ambulatory Visit: Payer: Self-pay

## 2023-11-17 ENCOUNTER — Telehealth: Payer: Self-pay

## 2023-11-17 DIAGNOSIS — G8929 Other chronic pain: Secondary | ICD-10-CM

## 2023-11-17 NOTE — Addendum Note (Signed)
 Addended by: Aundria Mems AHMAD on: 11/17/2023 01:28 PM   Modules accepted: Orders

## 2023-11-17 NOTE — Telephone Encounter (Signed)
 Patient needs order for UDS

## 2023-11-30 ENCOUNTER — Other Ambulatory Visit

## 2023-11-30 ENCOUNTER — Other Ambulatory Visit: Payer: Self-pay | Admitting: Internal Medicine

## 2023-12-02 ENCOUNTER — Ambulatory Visit (INDEPENDENT_AMBULATORY_CARE_PROVIDER_SITE_OTHER): Payer: Self-pay | Admitting: Internal Medicine

## 2023-12-02 ENCOUNTER — Encounter: Payer: Self-pay | Admitting: Internal Medicine

## 2023-12-02 VITALS — BP 137/78 | HR 80 | Temp 99.9°F | Ht 62.0 in | Wt 180.0 lb

## 2023-12-02 DIAGNOSIS — E039 Hypothyroidism, unspecified: Secondary | ICD-10-CM

## 2023-12-02 DIAGNOSIS — J101 Influenza due to other identified influenza virus with other respiratory manifestations: Secondary | ICD-10-CM | POA: Diagnosis not present

## 2023-12-02 DIAGNOSIS — G8929 Other chronic pain: Secondary | ICD-10-CM

## 2023-12-02 DIAGNOSIS — J069 Acute upper respiratory infection, unspecified: Secondary | ICD-10-CM

## 2023-12-02 LAB — POCT XPERT XPRESS SARS COVID-2/FLU/RSV
FLU A: POSITIVE
FLU B: NEGATIVE
RSV RNA, PCR: NEGATIVE
SARS Coronavirus 2: NEGATIVE

## 2023-12-02 MED ORDER — OSELTAMIVIR PHOSPHATE 75 MG PO CAPS: 75.0000 mg | ORAL_CAPSULE | Freq: Two times a day (BID) | ORAL | 0 refills | Status: AC

## 2023-12-02 MED ORDER — TRAMADOL HCL 50 MG PO TABS
50.0000 mg | ORAL_TABLET | Freq: Three times a day (TID) | ORAL | 0 refills | Status: DC | PRN
Start: 2023-12-02 — End: 2024-01-26

## 2023-12-02 MED ORDER — TRAMADOL HCL 50 MG PO TABS: 50.0000 mg | ORAL_TABLET | Freq: Three times a day (TID) | ORAL | 0 refills | Status: DC | PRN

## 2023-12-02 MED ORDER — BENZONATATE 100 MG PO CAPS
100.0000 mg | ORAL_CAPSULE | Freq: Three times a day (TID) | ORAL | 1 refills | Status: AC | PRN
Start: 2023-12-02 — End: 2023-12-22

## 2023-12-02 NOTE — Addendum Note (Signed)
 Addended byKatherine Mantle on: 12/02/2023 11:50 AM   Modules accepted: Orders

## 2023-12-02 NOTE — Addendum Note (Signed)
 Addended by: Aundria Mems AHMAD on: 12/02/2023 12:03 PM   Modules accepted: Orders

## 2023-12-02 NOTE — Progress Notes (Signed)
 Established Patient Office Visit  Subjective:  Patient ID: Christina Zamora, female    DOB: [birth year]  Age: 63 y.o. MRN: [id number]  Chief Complaint  Patient presents with   Pain Management   Cough    Cough, bodyaches, sore throat, headache Started yesterday     Here for pain management follow up. Chronic pain well controlled on current analgesia. Last drug screen satisfactory but  pill counts not done today. C/o URI symptoms x 1 day and her grandson was diagnosed with influenza.     No other concerns at this time.   Past Medical History:  Diagnosis Date   Arthritis    CAD (coronary artery disease)    Chronic headaches    GERD (gastroesophageal reflux disease)    Hyperlipidemia    Hypertension    Insomnia    Lumbago    Pulmonary HTN (HCC)    Thyroid disease    Vitamin D deficiency     Past Surgical History:  Procedure Laterality Date   BILATERAL CARPAL TUNNEL RELEASE  2000   BREAST EXCISIONAL BIOPSY Right 06/24/2010   surgical bx fat necrosis   CORONARY ANGIOGRAPHY N/A 05/29/2017   Procedure: CORONARY ANGIOGRAPHY;  Surgeon: Laurier Nancy, MD;  Location: [facility] INVASIVE CV LAB;  Service: Cardiovascular;  Laterality: N/A;   CORONARY ANGIOPLASTY WITH STENT PLACEMENT     LEFT HEART CATH Left 05/29/2017   Procedure: Left Heart Cath;  Surgeon: Laurier Nancy, MD;  Location: [facility] INVASIVE CV LAB;  Service: Cardiovascular;  Laterality: Left;   LEFT HEART CATH AND CORONARY ANGIOGRAPHY Left 01/27/2020   Procedure: LEFT HEART CATH AND CORONARY ANGIOGRAPHY;  Surgeon: Laurier Nancy, MD;  Location: [facility] INVASIVE CV LAB;  Service: Cardiovascular;  Laterality: Left;   REPLACEMENT TOTAL KNEE Left 2015    Social History   Socioeconomic History   Marital status: Married    Spouse name: Not on file   Number of children: Not on file   Years of education: Not on file   Highest education level: Not on file  Occupational History   Not on file  Tobacco Use   Smoking status: Former     Current packs/day: 0.00    Average packs/day: 0.5 packs/day for 20.0 years (10.0 ttl pk-yrs)    Types: Cigarettes    Start date: 60    Quit date: 2002    Years since quitting: 23.2   Smokeless tobacco: Never  Vaping Use   Vaping status: Never Used  Substance and Sexual Activity   Alcohol use: No    Alcohol/week: 0.0 standard drinks of alcohol   Drug use: No   Sexual activity: Not on file  Other Topics Concern   Not on file  Social History Narrative   Not on file   Social Drivers of Health   Financial Resource Strain: Not on file  Food Insecurity: Not on file  Transportation Needs: Not on file  Physical Activity: Not on file  Stress: Not on file  Social Connections: Not on file  Intimate Partner Violence: Not on file    Family History  Problem Relation Age of Onset   Heart failure Mother    Diabetes Mother    Hyperlipidemia Mother    Heart attack Father     Allergies  Allergen Reactions   Hydralazine Hcl Swelling and Palpitations    Outpatient Medications Prior to Visit  Medication Sig   amLODipine (NORVASC) 10 MG tablet TAKE 1 TABLET BY MOUTH EVERY DAY IN  THE MORNING   atorvastatin (LIPITOR) 80 MG tablet TAKE 1 TABLET BY MOUTH EVERYDAY AT BEDTIME   buPROPion (WELLBUTRIN SR) 150 MG 12 hr tablet TAKE 1 TABLET BY MOUTH TWICE A DAY   butalbital-acetaminophen-caffeine (FIORICET) 50-325-40 MG tablet Take 1 tablet by mouth 2 (two) times daily as needed for headache.   clopidogrel (PLAVIX) 75 MG tablet TAKE 1 TABLET BY MOUTH EVERY DAY **MAKE FOLLOW UP APPT WITH DR. KHAN FOR REFILLS**   cyclobenzaprine (FLEXERIL) 10 MG tablet TAKE 1 TABLET BY MOUTH AT BEDTIME FOR MUSCLE RELAXANT **WILL MAKE DROWSY   levothyroxine (SYNTHROID) 200 MCG tablet TAKE 1 TABLET BY MOUTH EVERY DAY IN THE MORNING WITH 25MG  DOSE   levothyroxine (SYNTHROID) 25 MCG tablet TAKE 1 TABLET BY MOUTH EVERY DAY IN THE MORNING WITH 200 MCG TABLET   metoprolol succinate (TOPROL-XL) 25 MG 24 hr tablet TAKE  1 TABLET BY MOUTH EVERY DAY **MAKE FOLLOW UP APPT WITH DR. KHAN FOR REFILLS**   nitroGLYCERIN (NITROSTAT) 0.4 MG SL tablet Place 1 tablet (0.4 mg total) under the tongue every 5 (five) minutes as needed for chest pain.   pantoprazole (PROTONIX) 40 MG tablet Take 40 mg by mouth daily as needed (acid reflux).    ramipril (ALTACE) 10 MG capsule TAKE 1 CAPSULE BY MOUTH EVERY DAY IN THE MORNING   [DISCONTINUED] traMADol (ULTRAM) 50 MG tablet Take 1 tablet (50 mg total) by mouth 3 (three) times daily as needed.   [DISCONTINUED] traMADol (ULTRAM) 50 MG tablet Take 1 tablet (50 mg total) by mouth 3 (three) times daily as needed.   Facility-Administered Medications Prior to Visit  Medication Dose Route Frequency Provider   iohexol (OMNIPAQUE) 180 MG/ML injection 20 mL  20 mL Other Once PRN Yevette Edwards, MD   iopamidol (ISOVUE-M) 41 % intrathecal injection 20 mL  20 mL Other Once PRN Yevette Edwards, MD   iopamidol (ISOVUE-M) 41 % intrathecal injection 20 mL  20 mL Other Once PRN Yevette Edwards, MD   iopamidol (ISOVUE-M) 41 % intrathecal injection 20 mL  20 mL Other Once PRN Yevette Edwards, MD   lactated ringers infusion 1,000 mL  1,000 mL Intravenous Continuous Yevette Edwards, MD   sodium chloride flush (NS) 0.9 % injection 10 mL  10 mL Other Once Yevette Edwards, MD   sodium chloride flush (NS) 0.9 % injection 10 mL  10 mL Other Once Yevette Edwards, MD   sodium chloride flush (NS) 0.9 % injection 3 mL  3 mL Intravenous Q12H Adrian Blackwater A, MD    ROS     Objective:   BP 137/78   Pulse 80   Temp 99.9 F (37.7 C)   Ht 5\' 2"  (1.575 m)   Wt 180 lb (81.6 kg)   SpO2 98%   BMI 32.92 kg/m   Vitals:   12/02/23 1023  BP: 137/78  Pulse: 80  Temp: 99.9 F (37.7 C)  Height: 5\' 2"  (1.575 m)  Weight: 180 lb (81.6 kg)  SpO2: 98%  BMI (Calculated): 32.91    Physical Exam Vitals reviewed.  Constitutional:      General: She is not in acute distress.    Appearance: She is obese.  HENT:      Head: Normocephalic.     Nose: Nose normal.     Mouth/Throat:     Mouth: Mucous membranes are moist.  Eyes:     Extraocular Movements: Extraocular movements intact.     Pupils: Pupils are  equal, round, and reactive to light.  Cardiovascular:     Rate and Rhythm: Normal rate and regular rhythm.     Heart sounds: No murmur heard. Pulmonary:     Effort: Pulmonary effort is normal.     Breath sounds: No rhonchi or rales.  Abdominal:     General: Abdomen is flat.     Palpations: There is no hepatomegaly, splenomegaly or mass.  Musculoskeletal:        General: Normal range of motion.     Cervical back: Normal range of motion. No tenderness.  Skin:    General: Skin is warm and dry.  Neurological:     General: No focal deficit present.     Mental Status: She is alert and oriented to person, place, and time.     Cranial Nerves: No cranial nerve deficit.     Motor: No weakness.  Psychiatric:        Mood and Affect: Mood normal.        Behavior: Behavior normal.      No results found for any visits on 12/02/23.  No results found for this or any previous visit (from the past 2160 hours).    Assessment & Plan:  As per problem list  Problem List Items Addressed This Visit       Endocrine   Acquired hypothyroidism     Other   Other chronic pain   Relevant Medications   traMADol (ULTRAM) 50 MG tablet   traMADol (ULTRAM) 50 MG tablet (Start on 01/02/2024)   Other Visit Diagnoses       Viral upper respiratory tract infection    -  Primary       Return in about 2 months (around 02/01/2024).   Total time spent: 20 minutes  Luna Fuse, MD  12/02/2023   This document may have been prepared by [facility] Voice Recognition software and as such may include unintentional dictation errors.

## 2023-12-04 LAB — DRUG SCREEN 13 W/CONF , SERUM
Amphetamines, IA: NEGATIVE ng/mL
Barbiturates, IA: NEGATIVE ug/mL
Benzodiazepines, IA: NEGATIVE ng/mL
Cocaine & Metabolite, IA: NEGATIVE ng/mL
FENTANYL, IA: NEGATIVE ng/mL
MEPERIDINE, IA: NEGATIVE ng/mL
Methadone, IA: NEGATIVE ng/mL
Opiates, IA: NEGATIVE ng/mL
Oxycodones, IA: NEGATIVE ng/mL
Phencyclidine, IA: NEGATIVE ng/mL
Propoxyphene, IA: NEGATIVE ng/mL
THC(Marijuana) Metabolite, IA: NEGATIVE ng/mL

## 2023-12-04 LAB — TRAMADOL,MS,WB/SP RFX

## 2023-12-04 LAB — LIPID PANEL
Chol/HDL Ratio: 3.2 ratio (ref 0.0–4.4)
Cholesterol, Total: 161 mg/dL (ref 100–199)
HDL: 51 mg/dL (ref >39)
LDL Chol Calc (NIH): 85 mg/dL (ref 0–99)
Triglycerides: 144 mg/dL (ref 0–149)
VLDL Cholesterol Cal: 25 mg/dL (ref 5–40)

## 2023-12-04 LAB — CK: Total CK: 74 U/L (ref 32–182)

## 2023-12-04 LAB — TSH: TSH: 3.2 u[IU]/mL (ref 0.450–4.500)

## 2023-12-07 ENCOUNTER — Encounter: Payer: Self-pay | Admitting: Internal Medicine

## 2023-12-07 ENCOUNTER — Other Ambulatory Visit: Payer: Self-pay

## 2023-12-07 MED ORDER — NEXLIZET 180-10 MG PO TABS: 1.0000 | ORAL_TABLET | Freq: Every day | ORAL | 1 refills | Status: DC

## 2023-12-07 NOTE — Progress Notes (Signed)
 Informed via Mychart message

## 2023-12-16 ENCOUNTER — Other Ambulatory Visit: Payer: Self-pay | Admitting: Cardiovascular Disease

## 2023-12-30 ENCOUNTER — Ambulatory Visit

## 2023-12-30 DIAGNOSIS — I25118 Atherosclerotic heart disease of native coronary artery with other forms of angina pectoris: Secondary | ICD-10-CM

## 2023-12-30 DIAGNOSIS — I340 Nonrheumatic mitral (valve) insufficiency: Secondary | ICD-10-CM | POA: Diagnosis not present

## 2023-12-30 DIAGNOSIS — I361 Nonrheumatic tricuspid (valve) insufficiency: Secondary | ICD-10-CM

## 2023-12-30 DIAGNOSIS — R0602 Shortness of breath: Secondary | ICD-10-CM

## 2023-12-30 DIAGNOSIS — I10 Essential (primary) hypertension: Secondary | ICD-10-CM

## 2023-12-31 ENCOUNTER — Ambulatory Visit

## 2024-01-11 ENCOUNTER — Ambulatory Visit

## 2024-01-14 ENCOUNTER — Ambulatory Visit: Admitting: Cardiovascular Disease

## 2024-01-18 ENCOUNTER — Other Ambulatory Visit: Payer: Self-pay | Admitting: Internal Medicine

## 2024-01-18 DIAGNOSIS — E039 Hypothyroidism, unspecified: Secondary | ICD-10-CM

## 2024-01-21 ENCOUNTER — Ambulatory Visit

## 2024-01-21 ENCOUNTER — Other Ambulatory Visit: Payer: Self-pay

## 2024-01-21 DIAGNOSIS — E039 Hypothyroidism, unspecified: Secondary | ICD-10-CM

## 2024-01-21 DIAGNOSIS — R0789 Other chest pain: Secondary | ICD-10-CM | POA: Diagnosis not present

## 2024-01-22 ENCOUNTER — Encounter: Payer: Self-pay | Admitting: Cardiovascular Disease

## 2024-01-25 ENCOUNTER — Ambulatory Visit: Admitting: Cardiovascular Disease

## 2024-01-26 ENCOUNTER — Encounter: Payer: Self-pay | Admitting: Cardiovascular Disease

## 2024-01-26 ENCOUNTER — Ambulatory Visit (INDEPENDENT_AMBULATORY_CARE_PROVIDER_SITE_OTHER): Admitting: Cardiovascular Disease

## 2024-01-26 VITALS — BP 118/80 | HR 83 | Ht 62.0 in | Wt 185.2 lb

## 2024-01-26 DIAGNOSIS — I10 Essential (primary) hypertension: Secondary | ICD-10-CM

## 2024-01-26 DIAGNOSIS — I340 Nonrheumatic mitral (valve) insufficiency: Secondary | ICD-10-CM | POA: Diagnosis not present

## 2024-01-26 DIAGNOSIS — I25118 Atherosclerotic heart disease of native coronary artery with other forms of angina pectoris: Secondary | ICD-10-CM

## 2024-01-26 DIAGNOSIS — R0789 Other chest pain: Secondary | ICD-10-CM

## 2024-01-26 DIAGNOSIS — R0602 Shortness of breath: Secondary | ICD-10-CM

## 2024-01-26 MED ORDER — RANOLAZINE ER 500 MG PO TB12: 500.0000 mg | ORAL_TABLET | Freq: Two times a day (BID) | ORAL | 2 refills | Status: DC

## 2024-01-26 NOTE — Progress Notes (Signed)
 Cardiology Office Note   Date:  01/26/2024   ID:  Christina Zamora, DOB [birth year], MRN [id number]  PCP:  Christina Daughters, MD  Cardiologist:  Christina Fairly, MD      History of Present Illness: Christina Zamora is a 63 y.o. female who presents for  Chief Complaint  Patient presents with   Follow-up    NST results    Noticed leg swelling at the end of day. Has Shortness of breath and woke up with tightness in chest needing nitroglycerin .      Past Medical History:  Diagnosis Date   Arthritis    CAD (coronary artery disease)    Chronic headaches    GERD (gastroesophageal reflux disease)    Hyperlipidemia    Hypertension    Insomnia    Lumbago    Pulmonary HTN (HCC)    Thyroid disease    Vitamin D deficiency      Past Surgical History:  Procedure Laterality Date   BILATERAL CARPAL TUNNEL RELEASE  2000   BREAST EXCISIONAL BIOPSY Right 06/24/2010   surgical bx fat necrosis   CORONARY ANGIOGRAPHY N/A 05/29/2017   Procedure: CORONARY ANGIOGRAPHY;  Surgeon: Christina Cornwall, MD;  Location: [facility] INVASIVE CV LAB;  Service: Cardiovascular;  Laterality: N/A;   CORONARY ANGIOPLASTY WITH STENT PLACEMENT     LEFT HEART CATH Left 05/29/2017   Procedure: Left Heart Cath;  Surgeon: Christina Cornwall, MD;  Location: [facility] INVASIVE CV LAB;  Service: Cardiovascular;  Laterality: Left;   LEFT HEART CATH AND CORONARY ANGIOGRAPHY Left 01/27/2020   Procedure: LEFT HEART CATH AND CORONARY ANGIOGRAPHY;  Surgeon: Christina Cornwall, MD;  Location: [facility] INVASIVE CV LAB;  Service: Cardiovascular;  Laterality: Left;   REPLACEMENT TOTAL KNEE Left 2015     Current Outpatient Medications  Medication Sig Dispense Refill   amLODipine  (NORVASC ) 10 MG tablet TAKE 1 TABLET BY MOUTH EVERY DAY IN THE MORNING 90 tablet 1   atorvastatin  (LIPITOR) 80 MG tablet TAKE 1 TABLET BY MOUTH EVERYDAY AT BEDTIME 90 tablet 0   Bempedoic Acid-Ezetimibe (NEXLIZET ) 180-10 MG TABS Take 1 tablet by mouth daily. 90 tablet 1    buPROPion  (WELLBUTRIN  SR) 150 MG 12 hr tablet TAKE 1 TABLET BY MOUTH TWICE A DAY 180 tablet 1   butalbital-acetaminophen -caffeine (FIORICET) 50-325-40 MG tablet Take 1 tablet by mouth 2 (two) times daily as needed for headache.     clopidogrel  (PLAVIX ) 75 MG tablet TAKE 1 TABLET BY MOUTH EVERY DAY **MAKE FOLLOW UP APPT WITH DR. Onya Eutsler FOR REFILLS** 90 tablet 2   cyclobenzaprine (FLEXERIL) 10 MG tablet TAKE 1 TABLET BY MOUTH AT BEDTIME FOR MUSCLE RELAXANT **WILL MAKE DROWSY 30 tablet 3   levothyroxine  (SYNTHROID ) 200 MCG tablet TAKE 1 TABLET BY MOUTH EVERY DAY IN THE MORNING WITH 25MG  DOSE 90 tablet 0   levothyroxine  (SYNTHROID ) 25 MCG tablet TAKE 1 TABLET BY MOUTH EVERY DAY IN THE MORNING WITH 200 MCG TABLET 90 tablet 1   metoprolol succinate (TOPROL-XL) 25 MG 24 hr tablet TAKE 1 TABLET BY MOUTH EVERY DAY **MAKE FOLLOW UP APPT WITH DR. Trenna Kiely FOR REFILLS** 90 tablet 4   nitroGLYCERIN  (NITROSTAT ) 0.4 MG SL tablet Place 1 tablet (0.4 mg total) under the tongue every 5 (five) minutes as needed for chest pain. 10 tablet 0   pantoprazole  (PROTONIX ) 40 MG tablet Take 40 mg by mouth daily as needed (acid reflux).      ramipril  (ALTACE ) 10 MG capsule TAKE 1 CAPSULE  BY MOUTH EVERY DAY IN THE MORNING 90 capsule 1   ranolazine (RANEXA) 500 MG 12 hr tablet Take 1 tablet (500 mg total) by mouth 2 (two) times daily. 60 tablet 2   traMADol  (ULTRAM ) 50 MG tablet Take 1 tablet (50 mg total) by mouth 3 (three) times daily as needed. 90 tablet 0   Current Facility-Administered Medications  Medication Dose Route Frequency Provider Last Rate Last Admin   iohexol  (OMNIPAQUE ) 180 MG/ML injection 20 mL  20 mL Other Once PRN Zamora, Christina G, MD   4 mL at 12/05/15 1545   iopamidol  (ISOVUE -M) 41 % intrathecal injection 20 mL  20 mL Other Once PRN Christina Hitch, MD       iopamidol  (ISOVUE -M) 41 % intrathecal injection 20 mL  20 mL Other Once PRN Christina Hitch, MD       iopamidol  (ISOVUE -M) 41 % intrathecal injection 20 mL  20  mL Other Once PRN Zamora, Christina G, MD       lactated ringers  infusion 1,000 mL  1,000 mL Intravenous Continuous Christina Hitch, MD       sodium chloride  flush (NS) 0.9 % injection 10 mL  10 mL Other Once Christina Hitch, MD       sodium chloride  flush (NS) 0.9 % injection 10 mL  10 mL Other Once Christina Hitch, MD       Facility-Administered Medications Ordered in Other Visits  Medication Dose Route Frequency Provider Last Rate Last Admin   sodium chloride  flush (NS) 0.9 % injection 3 mL  3 mL Intravenous Q12H Christina Cornwall, MD        Allergies:   Hydralazine  hcl    Social History:   reports that she quit smoking about 23 years ago. Her smoking use included cigarettes. She started smoking about 43 years ago. She has a 10 pack-year smoking history. She has never used smokeless tobacco. She reports that she does not drink alcohol and does not use drugs.   Family History:  family history includes Diabetes in her mother; Heart attack in her father; Heart failure in her mother; Hyperlipidemia in her mother.    ROS:     Review of Systems  Constitutional: Negative.   HENT: Negative.    Eyes: Negative.   Respiratory: Negative.    Gastrointestinal: Negative.   Genitourinary: Negative.   Musculoskeletal: Negative.   Skin: Negative.   Neurological: Negative.   Endo/Heme/Allergies: Negative.   Psychiatric/Behavioral: Negative.    All other systems reviewed and are negative.     All other systems are reviewed and negative.    PHYSICAL EXAM: VS:  BP 118/80   Pulse 83   Ht 5\' 2"  (1.575 m)   Wt 185 lb 3.2 oz (84 kg)   SpO2 96%   BMI 33.87 kg/m  , BMI Body mass index is 33.87 kg/m. Last weight:  Wt Readings from Last 3 Encounters:  01/26/24 185 lb 3.2 oz (84 kg)  12/02/23 180 lb (81.6 kg)  10/05/23 179 lb (81.2 kg)     Physical Exam Constitutional:      Appearance: Normal appearance.  Cardiovascular:     Rate and Rhythm: Normal rate and regular rhythm.     Heart sounds:  Normal heart sounds.  Pulmonary:     Effort: Pulmonary effort is normal.     Breath sounds: Normal breath sounds.  Musculoskeletal:     Right lower leg: No edema.     Left lower leg:  No edema.  Neurological:     Mental Status: She is alert.       EKG:   Recent Labs: 11/30/2023: TSH 3.200    Lipid Panel    Component Value Date/Time   CHOL 161 11/30/2023 1106   CHOL 222 (H) 03/31/2013 0444   TRIG 144 11/30/2023 1106   TRIG 198 03/31/2013 0444   HDL 51 11/30/2023 1106   HDL 45 03/31/2013 0444   CHOLHDL 3.2 11/30/2023 1106   CHOLHDL 4.1 08/07/2009 0425   VLDL 40 03/31/2013 0444   LDLCALC 85 11/30/2023 1106   LDLCALC 137 (H) 03/31/2013 0444      Other studies Reviewed: Additional studies/ records that were reviewed today include:  Review of the above records demonstrates:       No data to display            ASSESSMENT AND PLAN:    ICD-10-CM   1. Coronary artery disease of native artery of native heart with stable angina pectoris (HCC)  I25.118 ranolazine (RANEXA) 500 MG 12 hr tablet    2. SOB (shortness of breath)  R06.02 ranolazine (RANEXA) 500 MG 12 hr tablet    3. Nonrheumatic mitral valve regurgitation  I34.0 ranolazine (RANEXA) 500 MG 12 hr tablet    4. Primary hypertension  I10 ranolazine (RANEXA) 500 MG 12 hr tablet    5. Other chest pain  R07.89 ranolazine (RANEXA) 500 MG 12 hr tablet   Episode of chest pain, SOB and swelling of legs, advise echo and stress test, which were unremarkable       Problem List Items Addressed This Visit       Cardiovascular and Mediastinum   CAD (coronary artery disease) - Primary   Relevant Medications   ranolazine (RANEXA) 500 MG 12 hr tablet     Other   Chest pain   Relevant Medications   ranolazine (RANEXA) 500 MG 12 hr tablet   Other Visit Diagnoses       SOB (shortness of breath)       Relevant Medications   ranolazine (RANEXA) 500 MG 12 hr tablet     Nonrheumatic mitral valve regurgitation        Relevant Medications   ranolazine (RANEXA) 500 MG 12 hr tablet     Primary hypertension       Relevant Medications   ranolazine (RANEXA) 500 MG 12 hr tablet          Disposition:   Return in about 3 months (around 04/27/2024).    Total time spent: 40 minutes  Signed,  Christina Fairly, MD  01/26/2024 9:48 AM    [facility]

## 2024-02-01 ENCOUNTER — Ambulatory Visit (INDEPENDENT_AMBULATORY_CARE_PROVIDER_SITE_OTHER): Admitting: Internal Medicine

## 2024-02-01 VITALS — BP 120/80 | HR 72 | Temp 98.0°F | Ht 62.0 in | Wt 187.4 lb

## 2024-02-01 DIAGNOSIS — E039 Hypothyroidism, unspecified: Secondary | ICD-10-CM

## 2024-02-01 DIAGNOSIS — I10 Essential (primary) hypertension: Secondary | ICD-10-CM | POA: Diagnosis not present

## 2024-02-01 DIAGNOSIS — G8929 Other chronic pain: Secondary | ICD-10-CM

## 2024-02-01 MED ORDER — TRAMADOL HCL 50 MG PO TABS
50.0000 mg | ORAL_TABLET | Freq: Three times a day (TID) | ORAL | 0 refills | Status: DC | PRN
Start: 2024-02-01 — End: 2024-03-02

## 2024-02-01 NOTE — Progress Notes (Signed)
 Established Patient Office Visit  Subjective:  Patient ID: Christina Zamora, female    DOB: 28-Dec-1960  Age: 63 y.o. MRN: [id number]  Chief Complaint  Patient presents with   Pain Management    PM    Here for pain management follow up. Chronic pain well controlled on current analgesia. Last drug screen cancelled and failed to bring pill bottle.     No other concerns at this time.   Past Medical History:  Diagnosis Date   Arthritis    CAD (coronary artery disease)    Chronic headaches    GERD (gastroesophageal reflux disease)    Hyperlipidemia    Hypertension    Insomnia    Lumbago    Pulmonary HTN (HCC)    Thyroid disease    Vitamin D deficiency     Past Surgical History:  Procedure Laterality Date   BILATERAL CARPAL TUNNEL RELEASE  2000   BREAST EXCISIONAL BIOPSY Right 06/24/2010   surgical bx fat necrosis   CORONARY ANGIOGRAPHY N/A 05/29/2017   Procedure: CORONARY ANGIOGRAPHY;  Surgeon: Cherrie Cornwall, MD;  Location: [facility] INVASIVE CV LAB;  Service: Cardiovascular;  Laterality: N/A;   CORONARY ANGIOPLASTY WITH STENT PLACEMENT     LEFT HEART CATH Left 05/29/2017   Procedure: Left Heart Cath;  Surgeon: Cherrie Cornwall, MD;  Location: [facility] INVASIVE CV LAB;  Service: Cardiovascular;  Laterality: Left;   LEFT HEART CATH AND CORONARY ANGIOGRAPHY Left 01/27/2020   Procedure: LEFT HEART CATH AND CORONARY ANGIOGRAPHY;  Surgeon: Cherrie Cornwall, MD;  Location: [facility] INVASIVE CV LAB;  Service: Cardiovascular;  Laterality: Left;   REPLACEMENT TOTAL KNEE Left 2015    Social History   Socioeconomic History   Marital status: Married    Spouse name: Not on file   Number of children: Not on file   Years of education: Not on file   Highest education level: Not on file  Occupational History   Not on file  Tobacco Use   Smoking status: Former    Current packs/day: 0.00    Average packs/day: 0.5 packs/day for 20.0 years (10.0 ttl pk-yrs)    Types: Cigarettes    Start date: 75     Quit date: 2002    Years since quitting: 23.3   Smokeless tobacco: Never  Vaping Use   Vaping status: Never Used  Substance and Sexual Activity   Alcohol use: No    Alcohol/week: 0.0 standard drinks of alcohol   Drug use: No   Sexual activity: Not on file  Other Topics Concern   Not on file  Social History Narrative   Not on file   Social Drivers of Health   Financial Resource Strain: Not on file  Food Insecurity: Not on file  Transportation Needs: Not on file  Physical Activity: Not on file  Stress: Not on file  Social Connections: Not on file  Intimate Partner Violence: Not on file    Family History  Problem Relation Age of Onset   Heart failure Mother    Diabetes Mother    Hyperlipidemia Mother    Heart attack Father     Allergies  Allergen Reactions   Hydralazine  Hcl Swelling and Palpitations    Outpatient Medications Prior to Visit  Medication Sig   amLODipine  (NORVASC ) 10 MG tablet TAKE 1 TABLET BY MOUTH EVERY DAY IN THE MORNING   atorvastatin  (LIPITOR) 80 MG tablet TAKE 1 TABLET BY MOUTH EVERYDAY AT BEDTIME   Bempedoic Acid-Ezetimibe (NEXLIZET ) 180-10 MG  TABS Take 1 tablet by mouth daily.   buPROPion  (WELLBUTRIN  SR) 150 MG 12 hr tablet TAKE 1 TABLET BY MOUTH TWICE A DAY   butalbital-acetaminophen -caffeine (FIORICET) 50-325-40 MG tablet Take 1 tablet by mouth 2 (two) times daily as needed for headache.   clopidogrel  (PLAVIX ) 75 MG tablet TAKE 1 TABLET BY MOUTH EVERY DAY **MAKE FOLLOW UP APPT WITH DR. KHAN FOR REFILLS**   cyclobenzaprine (FLEXERIL) 10 MG tablet TAKE 1 TABLET BY MOUTH AT BEDTIME FOR MUSCLE RELAXANT **WILL MAKE DROWSY   levothyroxine  (SYNTHROID ) 200 MCG tablet TAKE 1 TABLET BY MOUTH EVERY DAY IN THE MORNING WITH 25MG  DOSE   levothyroxine  (SYNTHROID ) 25 MCG tablet TAKE 1 TABLET BY MOUTH EVERY DAY IN THE MORNING WITH 200 MCG TABLET   metoprolol succinate (TOPROL-XL) 25 MG 24 hr tablet TAKE 1 TABLET BY MOUTH EVERY DAY **MAKE FOLLOW UP APPT WITH  DR. KHAN FOR REFILLS**   nitroGLYCERIN  (NITROSTAT ) 0.4 MG SL tablet Place 1 tablet (0.4 mg total) under the tongue every 5 (five) minutes as needed for chest pain.   pantoprazole  (PROTONIX ) 40 MG tablet Take 40 mg by mouth daily as needed (acid reflux).    ramipril  (ALTACE ) 10 MG capsule TAKE 1 CAPSULE BY MOUTH EVERY DAY IN THE MORNING   ranolazine  (RANEXA ) 500 MG 12 hr tablet Take 1 tablet (500 mg total) by mouth 2 (two) times daily.   [DISCONTINUED] traMADol  (ULTRAM ) 50 MG tablet Take 1 tablet (50 mg total) by mouth 3 (three) times daily as needed.   Facility-Administered Medications Prior to Visit  Medication Dose Route Frequency Provider   iohexol  (OMNIPAQUE ) 180 MG/ML injection 20 mL  20 mL Other Once PRN Zula Hitch, MD   iopamidol  (ISOVUE -M) 41 % intrathecal injection 20 mL  20 mL Other Once PRN Zula Hitch, MD   iopamidol  (ISOVUE -M) 41 % intrathecal injection 20 mL  20 mL Other Once PRN Zula Hitch, MD   iopamidol  (ISOVUE -M) 41 % intrathecal injection 20 mL  20 mL Other Once PRN Adams, James G, MD   lactated ringers  infusion 1,000 mL  1,000 mL Intravenous Continuous Zula Hitch, MD   sodium chloride  flush (NS) 0.9 % injection 10 mL  10 mL Other Once Zula Hitch, MD   sodium chloride  flush (NS) 0.9 % injection 10 mL  10 mL Other Once Zula Hitch, MD   sodium chloride  flush (NS) 0.9 % injection 3 mL  3 mL Intravenous Q12H Cherrie Cornwall, MD    Review of Systems  Constitutional: Negative.   HENT: Negative.    Eyes: Negative.   Respiratory: Negative.    Cardiovascular: Negative.   Gastrointestinal: Negative.   Genitourinary: Negative.   Skin: Negative.   Neurological: Negative.   Endo/Heme/Allergies: Negative.        Objective:   BP 120/80   Pulse 72   Temp 98 F (36.7 C)   Ht 5\' 2"  (1.575 m)   Wt 187 lb 6.4 oz (85 kg)   SpO2 92%   BMI 34.28 kg/m   Vitals:   02/01/24 1037  BP: 120/80  Pulse: 72  Temp: 98 F (36.7 C)  Height: 5\' 2"  (1.575 m)   Weight: 187 lb 6.4 oz (85 kg)  SpO2: 92%  BMI (Calculated): 34.27    Physical Exam Vitals reviewed.  Constitutional:      General: She is not in acute distress.    Appearance: She is obese.  HENT:     Head: Normocephalic.  Nose: Nose normal.     Mouth/Throat:     Mouth: Mucous membranes are moist.  Eyes:     Extraocular Movements: Extraocular movements intact.     Pupils: Pupils are equal, round, and reactive to light.  Cardiovascular:     Rate and Rhythm: Normal rate and regular rhythm.     Heart sounds: No murmur heard. Pulmonary:     Effort: Pulmonary effort is normal.     Breath sounds: No rhonchi or rales.  Abdominal:     General: Abdomen is flat.     Palpations: There is no hepatomegaly, splenomegaly or mass.  Musculoskeletal:        General: Normal range of motion.     Cervical back: Normal range of motion. No tenderness.  Skin:    General: Skin is warm and dry.  Neurological:     General: No focal deficit present.     Mental Status: She is alert and oriented to person, place, and time.     Cranial Nerves: No cranial nerve deficit.     Motor: No weakness.  Psychiatric:        Mood and Affect: Mood normal.        Behavior: Behavior normal.      No results found for any visits on 02/01/24.  Recent Results (from the past 2160 hours)  Drug Screen 13 with reflex Confirmation (AMP,BAR,BZO,COC,PCP,THC,OPI,OXY,MD,FEN,MEP,PPX,TRAM), Serum     Status: None   Collection Time: 11/30/23 11:06 AM  Result Value Ref Range   Amphetamines, IA Negative Cutoff:50 ng/mL   Barbiturates, IA Negative Cutoff:0.1 ug/mL   Benzodiazepines, IA Negative Cutoff:20 ng/mL   Cocaine & Metabolite, IA Negative Cutoff:25 ng/mL   Phencyclidine, IA Negative Cutoff:8 ng/mL   THC(Marijuana) Metabolite, IA Negative Cutoff:5 ng/mL   Opiates, IA Negative Cutoff:5 ng/mL   Oxycodones, IA Negative Cutoff:5 ng/mL   Methadone, IA Negative Cutoff:25 ng/mL   FENTANYL , IA Negative Cutoff:1.0 ng/mL    Propoxyphene, IA Negative Cutoff:50 ng/mL   MEPERIDINE, IA Negative Cutoff:100 ng/mL   TRAMADOL , IA CANCELED ng/mL    Comment: Test not performed Insufficient specimen volume received to perform analysis.  Result canceled by the ancillary.   Lipid panel     Status: None   Collection Time: 11/30/23 11:06 AM  Result Value Ref Range   Cholesterol, Total 161 100 - 199 mg/dL   Triglycerides 161 0 - 149 mg/dL   HDL 51 >09 mg/dL   VLDL Cholesterol Cal 25 5 - 40 mg/dL   LDL Chol Calc (NIH) 85 0 - 99 mg/dL   Chol/HDL Ratio 3.2 0.0 - 4.4 ratio    Comment:                                   T. Chol/HDL Ratio                                             Men  Women                               1/2 Avg.Risk  3.4    3.3  Avg.Risk  5.0    4.4                                2X Avg.Risk  9.6    7.1                                3X Avg.Risk 23.4   11.0   TSH     Status: None   Collection Time: 11/30/23 11:06 AM  Result Value Ref Range   TSH 3.200 0.450 - 4.500 uIU/mL  Tramadol ,MS,WB/SP RFX     Status: None   Collection Time: 11/30/23 11:06 AM  Result Value Ref Range   Tramadol  Confirmation CANCELED     Comment: Test not performed Insufficient specimen volume received to perform analysis.  Result canceled by the ancillary.    Tramadol  CANCELED ng/mL    Comment: Test not performed Insufficient specimen volume received to perform analysis.  Result canceled by the ancillary.    O-Desmethyltramadol CANCELED ng/mL    Comment: Test not performed Insufficient specimen volume received to perform analysis.  Result canceled by the ancillary.   CK     Status: None   Collection Time: 11/30/23 11:06 AM  Result Value Ref Range   Total CK 74 32 - 182 U/L  POCT XPERT XPRESS SARS COVID-2/FLU/RSV     Status: None   Collection Time: 12/02/23 11:47 AM  Result Value Ref Range   SARS Coronavirus 2 Negative    FLU A Positive    FLU B Negative    RSV RNA, PCR  Negative       Assessment & Plan:  As per problem list  Problem List Items Addressed This Visit       Endocrine   Acquired hypothyroidism - Primary     Other   Other chronic pain   Relevant Medications   traMADol  (ULTRAM ) 50 MG tablet   Other Relevant Orders   Drug Screen 13 with reflex Confirmation (AMP,BAR,BZO,COC,PCP,THC,OPI,OXY,MD,FEN,MEP,PPX,TRAM), Serum   Other Visit Diagnoses       Primary hypertension           Return in about 1 month (around 03/03/2024).   Total time spent: 20 minutes  Arzella Bitters, MD  02/01/2024   This document may have been prepared by [facility] Voice Recognition software and as such may include unintentional dictation errors.

## 2024-02-02 ENCOUNTER — Encounter: Payer: Self-pay | Admitting: Internal Medicine

## 2024-02-03 ENCOUNTER — Other Ambulatory Visit: Payer: Self-pay

## 2024-02-03 DIAGNOSIS — E039 Hypothyroidism, unspecified: Secondary | ICD-10-CM

## 2024-02-05 MED ORDER — LEVOTHYROXINE SODIUM 25 MCG PO TABS: 25.0000 ug | ORAL_TABLET | Freq: Every day | ORAL | 1 refills | Status: DC

## 2024-02-09 LAB — TRAMADOL,MS,WB/SP RFX
O-Desmethyltramadol: 8.4 ng/mL
Tramadol Confirmation: POSITIVE
Tramadol: 155 ng/mL

## 2024-02-09 LAB — DRUG SCREEN 13 W/CONF , SERUM
Amphetamines, IA: NEGATIVE ng/mL
Barbiturates, IA: NEGATIVE ug/mL
Benzodiazepines, IA: NEGATIVE ng/mL
Cocaine & Metabolite, IA: NEGATIVE ng/mL
FENTANYL, IA: NEGATIVE ng/mL
MEPERIDINE, IA: NEGATIVE ng/mL
Methadone, IA: NEGATIVE ng/mL
Opiates, IA: NEGATIVE ng/mL
Oxycodones, IA: NEGATIVE ng/mL
Phencyclidine, IA: NEGATIVE ng/mL
Propoxyphene, IA: NEGATIVE ng/mL
THC(Marijuana) Metabolite, IA: NEGATIVE ng/mL
TRAMADOL, IA: POSITIVE ng/mL — AB

## 2024-02-12 ENCOUNTER — Other Ambulatory Visit: Payer: Self-pay | Admitting: Internal Medicine

## 2024-02-12 DIAGNOSIS — I2510 Atherosclerotic heart disease of native coronary artery without angina pectoris: Secondary | ICD-10-CM

## 2024-02-15 ENCOUNTER — Other Ambulatory Visit: Payer: Self-pay | Admitting: Internal Medicine

## 2024-02-15 DIAGNOSIS — M5430 Sciatica, unspecified side: Secondary | ICD-10-CM

## 2024-03-02 ENCOUNTER — Ambulatory Visit (INDEPENDENT_AMBULATORY_CARE_PROVIDER_SITE_OTHER): Admitting: Internal Medicine

## 2024-03-02 VITALS — BP 122/78 | HR 75 | Ht 62.0 in | Wt 185.0 lb

## 2024-03-02 DIAGNOSIS — I25118 Atherosclerotic heart disease of native coronary artery with other forms of angina pectoris: Secondary | ICD-10-CM | POA: Diagnosis not present

## 2024-03-02 DIAGNOSIS — I2510 Atherosclerotic heart disease of native coronary artery without angina pectoris: Secondary | ICD-10-CM

## 2024-03-02 DIAGNOSIS — I872 Venous insufficiency (chronic) (peripheral): Secondary | ICD-10-CM | POA: Insufficient documentation

## 2024-03-02 DIAGNOSIS — E039 Hypothyroidism, unspecified: Secondary | ICD-10-CM | POA: Diagnosis not present

## 2024-03-02 DIAGNOSIS — G8929 Other chronic pain: Secondary | ICD-10-CM

## 2024-03-02 DIAGNOSIS — Z0130 Encounter for examination of blood pressure without abnormal findings: Secondary | ICD-10-CM

## 2024-03-02 MED ORDER — AMLODIPINE BESYLATE 10 MG PO TABS: 10.0000 mg | ORAL_TABLET | Freq: Every day | ORAL | 1 refills | Status: DC

## 2024-03-02 MED ORDER — TRAMADOL HCL 50 MG PO TABS: 50.0000 mg | ORAL_TABLET | Freq: Three times a day (TID) | ORAL | 0 refills | Status: DC | PRN

## 2024-03-02 NOTE — Progress Notes (Signed)
 Established Patient Office Visit  Subjective:  Patient ID: Christina Zamora, female    DOB: Jul 26, 1961  Age: 63 y.o. MRN: [id number]  Chief Complaint  Patient presents with   Pain Management    Pain management    Here for pain management follow up. Chronic pain well controlled on current analgesia. Last DS satisfactory and pill counts have also been satisfactory.     No other concerns at this time.   Past Medical History:  Diagnosis Date   Arthritis    CAD (coronary artery disease)    Chronic headaches    GERD (gastroesophageal reflux disease)    Hyperlipidemia    Hypertension    Insomnia    Lumbago    Pulmonary HTN (HCC)    Thyroid disease    Vitamin D deficiency     Past Surgical History:  Procedure Laterality Date   BILATERAL CARPAL TUNNEL RELEASE  2000   BREAST EXCISIONAL BIOPSY Right 06/24/2010   surgical bx fat necrosis   CORONARY ANGIOGRAPHY N/A 05/29/2017   Procedure: CORONARY ANGIOGRAPHY;  Surgeon: Cherrie Cornwall, MD;  Location: [facility] INVASIVE CV LAB;  Service: Cardiovascular;  Laterality: N/A;   CORONARY ANGIOPLASTY WITH STENT PLACEMENT     LEFT HEART CATH Left 05/29/2017   Procedure: Left Heart Cath;  Surgeon: Cherrie Cornwall, MD;  Location: [facility] INVASIVE CV LAB;  Service: Cardiovascular;  Laterality: Left;   LEFT HEART CATH AND CORONARY ANGIOGRAPHY Left 01/27/2020   Procedure: LEFT HEART CATH AND CORONARY ANGIOGRAPHY;  Surgeon: Cherrie Cornwall, MD;  Location: [facility] INVASIVE CV LAB;  Service: Cardiovascular;  Laterality: Left;   REPLACEMENT TOTAL KNEE Left 2015    Social History   Socioeconomic History   Marital status: Married    Spouse name: Not on file   Number of children: Not on file   Years of education: Not on file   Highest education level: Not on file  Occupational History   Not on file  Tobacco Use   Smoking status: Former    Current packs/day: 0.00    Average packs/day: 0.5 packs/day for 20.0 years (10.0 ttl pk-yrs)    Types: Cigarettes     Start date: 69    Quit date: 2002    Years since quitting: 23.4   Smokeless tobacco: Never  Vaping Use   Vaping status: Never Used  Substance and Sexual Activity   Alcohol use: No    Alcohol/week: 0.0 standard drinks of alcohol   Drug use: No   Sexual activity: Not on file  Other Topics Concern   Not on file  Social History Narrative   Not on file   Social Drivers of Health   Financial Resource Strain: Not on file  Food Insecurity: Not on file  Transportation Needs: Not on file  Physical Activity: Not on file  Stress: Not on file  Social Connections: Not on file  Intimate Partner Violence: Not on file    Family History  Problem Relation Age of Onset   Heart failure Mother    Diabetes Mother    Hyperlipidemia Mother    Heart attack Father     Allergies  Allergen Reactions   Hydralazine  Hcl Swelling and Palpitations    Outpatient Medications Prior to Visit  Medication Sig   amLODipine  (NORVASC ) 10 MG tablet TAKE 1 TABLET BY MOUTH EVERY DAY IN THE MORNING   atorvastatin  (LIPITOR) 80 MG tablet TAKE 1 TABLET BY MOUTH EVERYDAY AT BEDTIME   Bempedoic Acid-Ezetimibe (NEXLIZET ) 180-10  MG TABS Take 1 tablet by mouth daily.   buPROPion  (WELLBUTRIN  SR) 150 MG 12 hr tablet TAKE 1 TABLET BY MOUTH TWICE A DAY   butalbital-acetaminophen -caffeine (FIORICET) 50-325-40 MG tablet Take 1 tablet by mouth 2 (two) times daily as needed for headache.   clopidogrel  (PLAVIX ) 75 MG tablet TAKE 1 TABLET BY MOUTH EVERY DAY **MAKE FOLLOW UP APPT WITH DR. KHAN FOR REFILLS**   cyclobenzaprine (FLEXERIL) 10 MG tablet TAKE 1 TABLET BY MOUTH AT BEDTIME FOR MUSCLE RELAXANT **WILL MAKE DROWSY   levothyroxine  (SYNTHROID ) 200 MCG tablet TAKE 1 TABLET BY MOUTH EVERY DAY IN THE MORNING WITH 25MG  DOSE   levothyroxine  (SYNTHROID ) 25 MCG tablet Take 1 tablet (25 mcg total) by mouth daily before breakfast.   metoprolol succinate (TOPROL-XL) 25 MG 24 hr tablet TAKE 1 TABLET BY MOUTH EVERY DAY **MAKE FOLLOW  UP APPT WITH DR. KHAN FOR REFILLS**   nitroGLYCERIN  (NITROSTAT ) 0.4 MG SL tablet Place 1 tablet (0.4 mg total) under the tongue every 5 (five) minutes as needed for chest pain.   pantoprazole  (PROTONIX ) 40 MG tablet Take 40 mg by mouth daily as needed (acid reflux).    ramipril  (ALTACE ) 10 MG capsule TAKE 1 CAPSULE BY MOUTH EVERY DAY IN THE MORNING   ranolazine  (RANEXA ) 500 MG 12 hr tablet Take 1 tablet (500 mg total) by mouth 2 (two) times daily.   traMADol  (ULTRAM ) 50 MG tablet Take 1 tablet (50 mg total) by mouth 3 (three) times daily as needed.   Facility-Administered Medications Prior to Visit  Medication Dose Route Frequency Provider   iohexol  (OMNIPAQUE ) 180 MG/ML injection 20 mL  20 mL Other Once PRN Zula Hitch, MD   iopamidol  (ISOVUE -M) 41 % intrathecal injection 20 mL  20 mL Other Once PRN Zula Hitch, MD   iopamidol  (ISOVUE -M) 41 % intrathecal injection 20 mL  20 mL Other Once PRN Zula Hitch, MD   iopamidol  (ISOVUE -M) 41 % intrathecal injection 20 mL  20 mL Other Once PRN Adams, James G, MD   lactated ringers  infusion 1,000 mL  1,000 mL Intravenous Continuous Zula Hitch, MD   sodium chloride  flush (NS) 0.9 % injection 10 mL  10 mL Other Once Zula Hitch, MD   sodium chloride  flush (NS) 0.9 % injection 10 mL  10 mL Other Once Zula Hitch, MD   sodium chloride  flush (NS) 0.9 % injection 3 mL  3 mL Intravenous Q12H Cherrie Cornwall, MD    Review of Systems  Constitutional: Negative.   HENT: Negative.    Eyes: Negative.   Respiratory: Negative.    Cardiovascular: Negative.   Gastrointestinal: Negative.   Genitourinary: Negative.   Skin: Negative.   Neurological: Negative.   Endo/Heme/Allergies: Negative.        Objective:   BP 122/78   Pulse 75   Ht 5' 2 (1.575 m)   Wt 185 lb (83.9 kg)   SpO2 95%   BMI 33.84 kg/m   Vitals:   03/02/24 1003  BP: 122/78  Pulse: 75  Height: 5' 2 (1.575 m)  Weight: 185 lb (83.9 kg)  SpO2: 95%  BMI  (Calculated): 33.83    Physical Exam Vitals reviewed.  Constitutional:      General: She is not in acute distress.    Appearance: She is obese.  HENT:     Head: Normocephalic.     Nose: Nose normal.     Mouth/Throat:     Mouth: Mucous membranes are  moist.  Eyes:     Extraocular Movements: Extraocular movements intact.     Pupils: Pupils are equal, round, and reactive to light.  Cardiovascular:     Rate and Rhythm: Normal rate and regular rhythm.     Heart sounds: No murmur heard. Pulmonary:     Effort: Pulmonary effort is normal.     Breath sounds: No rhonchi or rales.  Abdominal:     General: Abdomen is flat.     Palpations: There is no hepatomegaly, splenomegaly or mass.  Musculoskeletal:        General: Normal range of motion.     Cervical back: Normal range of motion. No tenderness.     Right lower leg: 1+ Edema present.     Left lower leg: 1+ Edema present.  Skin:    General: Skin is warm and dry.  Neurological:     General: No focal deficit present.     Mental Status: She is alert and oriented to person, place, and time.     Cranial Nerves: No cranial nerve deficit.     Motor: No weakness.  Psychiatric:        Mood and Affect: Mood normal.        Behavior: Behavior normal.      No results found for any visits on 03/02/24.  Recent Results (from the past 2160 hours)  Drug Screen 13 with reflex Confirmation (AMP,BAR,BZO,COC,PCP,THC,OPI,OXY,MD,FEN,MEP,PPX,TRAM), Serum     Status: Abnormal   Collection Time: 02/01/24 11:25 AM  Result Value Ref Range   Amphetamines, IA Negative Cutoff:50 ng/mL   Barbiturates, IA Negative Cutoff:0.1 ug/mL   Benzodiazepines, IA Negative Cutoff:20 ng/mL   Cocaine & Metabolite, IA Negative Cutoff:25 ng/mL   Phencyclidine, IA Negative Cutoff:8 ng/mL   THC(Marijuana) Metabolite, IA Negative Cutoff:5 ng/mL   Opiates, IA Negative Cutoff:5 ng/mL   Oxycodones, IA Negative Cutoff:5 ng/mL   Methadone, IA Negative Cutoff:25 ng/mL    FENTANYL , IA Negative Cutoff:1.0 ng/mL   Propoxyphene, IA Negative Cutoff:50 ng/mL   MEPERIDINE, IA Negative Cutoff:100 ng/mL   TRAMADOL , IA ++POSITIVE++ (A) Cutoff:50 ng/mL    Comment: This test was developed and its performance characteristics determined by [facility].  It has not been cleared or approved by the Food and Drug Administration.   Tramadol ,MS,WB/SP RFX     Status: None   Collection Time: 02/01/24 11:25 AM  Result Value Ref Range   Tramadol  Confirmation Positive    Tramadol  155.0 ng/mL   O-Desmethyltramadol 8.4 ng/mL    Comment: Confirmation threshold: 10 ng/mL      Assessment & Plan:  As per problem list, TSH, lipids and CMP released.  Problem List Items Addressed This Visit       Cardiovascular and Mediastinum   CAD (coronary artery disease)   Venous insufficiency - Primary     Endocrine   Acquired hypothyroidism     Other   Other chronic pain   Other Visit Diagnoses       Atherosclerotic heart disease of native coronary artery without angina pectoris           Return in about 2 months (around 05/02/2024) for fu with labs prior, Pain Management.   Total time spent: 20 minutes  Arzella Bitters, MD  03/02/2024   This document may have been prepared by [facility] Voice Recognition software and as such may include unintentional dictation errors.

## 2024-03-17 ENCOUNTER — Telehealth: Payer: Self-pay

## 2024-03-17 NOTE — Telephone Encounter (Signed)
 Patient LM asking for  call back,   Returned call and LVM for pt to call back

## 2024-03-28 ENCOUNTER — Telehealth: Payer: Self-pay

## 2024-03-28 NOTE — Telephone Encounter (Signed)
 Patient called stating that she has not been able to get her tramadol  due to it not being covered by her insurance, she said she was getting some from a friend of hers that is on the same medication and dose. She wasn't sure if she needed to continue to be in the pain clinic bc of this or not. I can give her the information for cash pay only to see if she's able to afford it that way if you're okay with that please advise

## 2024-03-31 ENCOUNTER — Other Ambulatory Visit: Payer: Self-pay

## 2024-03-31 DIAGNOSIS — G8929 Other chronic pain: Secondary | ICD-10-CM

## 2024-04-01 ENCOUNTER — Encounter: Admitting: Internal Medicine

## 2024-04-01 MED ORDER — TRAMADOL HCL 50 MG PO TABS
50.0000 mg | ORAL_TABLET | Freq: Three times a day (TID) | ORAL | 0 refills | Status: DC | PRN
Start: 2024-04-01 — End: 2024-05-02

## 2024-04-15 ENCOUNTER — Other Ambulatory Visit: Payer: Self-pay | Admitting: Cardiovascular Disease

## 2024-04-15 DIAGNOSIS — I2510 Atherosclerotic heart disease of native coronary artery without angina pectoris: Secondary | ICD-10-CM

## 2024-04-29 ENCOUNTER — Other Ambulatory Visit: Payer: Self-pay | Admitting: Cardiovascular Disease

## 2024-04-29 DIAGNOSIS — I10 Essential (primary) hypertension: Secondary | ICD-10-CM

## 2024-04-29 DIAGNOSIS — R0602 Shortness of breath: Secondary | ICD-10-CM

## 2024-04-29 DIAGNOSIS — I340 Nonrheumatic mitral (valve) insufficiency: Secondary | ICD-10-CM

## 2024-04-29 DIAGNOSIS — R0789 Other chest pain: Secondary | ICD-10-CM

## 2024-04-29 DIAGNOSIS — I25118 Atherosclerotic heart disease of native coronary artery with other forms of angina pectoris: Secondary | ICD-10-CM

## 2024-05-02 ENCOUNTER — Ambulatory Visit (INDEPENDENT_AMBULATORY_CARE_PROVIDER_SITE_OTHER): Admitting: Internal Medicine

## 2024-05-02 VITALS — BP 124/78 | HR 68 | Temp 97.6°F | Ht 62.0 in | Wt 188.8 lb

## 2024-05-02 DIAGNOSIS — E66811 Obesity, class 1: Secondary | ICD-10-CM

## 2024-05-02 DIAGNOSIS — I25118 Atherosclerotic heart disease of native coronary artery with other forms of angina pectoris: Secondary | ICD-10-CM | POA: Diagnosis not present

## 2024-05-02 DIAGNOSIS — G8929 Other chronic pain: Secondary | ICD-10-CM

## 2024-05-02 DIAGNOSIS — I10 Essential (primary) hypertension: Secondary | ICD-10-CM | POA: Diagnosis not present

## 2024-05-02 DIAGNOSIS — E039 Hypothyroidism, unspecified: Secondary | ICD-10-CM

## 2024-05-02 DIAGNOSIS — Z6834 Body mass index (BMI) 34.0-34.9, adult: Secondary | ICD-10-CM

## 2024-05-02 DIAGNOSIS — E6609 Other obesity due to excess calories: Secondary | ICD-10-CM

## 2024-05-02 DIAGNOSIS — L639 Alopecia areata, unspecified: Secondary | ICD-10-CM

## 2024-05-02 MED ORDER — TRAMADOL HCL 50 MG PO TABS: 50.0000 mg | ORAL_TABLET | Freq: Three times a day (TID) | ORAL | 0 refills | Status: DC | PRN

## 2024-05-02 MED ORDER — LEVOTHYROXINE SODIUM 200 MCG PO TABS: 200.0000 ug | ORAL_TABLET | Freq: Every day | ORAL | 0 refills | Status: DC

## 2024-05-02 MED ORDER — TIRZEPATIDE-WEIGHT MANAGEMENT 2.5 MG/0.5ML ~~LOC~~ SOLN: 2.5000 mg | SUBCUTANEOUS | 1 refills | Status: DC

## 2024-05-02 NOTE — Progress Notes (Signed)
 Established Patient Office Visit  Subjective:  Patient ID: Christina Zamora, female    DOB: 1960/11/05  Age: 63 y.o. MRN: [id number]  Chief Complaint  Patient presents with  . Pain Management    PM    Here for pain management follow up. Chronic pain well controlled on current analgesia. Last UDS satisfactory but didn't bring bottle for pill count. Also wants to lose weight.      No other concerns at this time.   Past Medical History:  Diagnosis Date  . Arthritis   . CAD (coronary artery disease)   . Chronic headaches   . GERD (gastroesophageal reflux disease)   . Hyperlipidemia   . Hypertension   . Insomnia   . Lumbago   . Pulmonary HTN (HCC)   . Thyroid disease   . Vitamin D deficiency     Past Surgical History:  Procedure Laterality Date  . BILATERAL CARPAL TUNNEL RELEASE  2000  . BREAST EXCISIONAL BIOPSY Right 06/24/2010   surgical bx fat necrosis  . CORONARY ANGIOGRAPHY N/A 05/29/2017   Procedure: CORONARY ANGIOGRAPHY;  Surgeon: Fernand Denyse LABOR, MD;  Location: [facility] INVASIVE CV LAB;  Service: Cardiovascular;  Laterality: N/A;  . CORONARY ANGIOPLASTY WITH STENT PLACEMENT    . LEFT HEART CATH Left 05/29/2017   Procedure: Left Heart Cath;  Surgeon: Fernand Denyse LABOR, MD;  Location: [facility] INVASIVE CV LAB;  Service: Cardiovascular;  Laterality: Left;  . LEFT HEART CATH AND CORONARY ANGIOGRAPHY Left 01/27/2020   Procedure: LEFT HEART CATH AND CORONARY ANGIOGRAPHY;  Surgeon: Fernand Denyse LABOR, MD;  Location: [facility] INVASIVE CV LAB;  Service: Cardiovascular;  Laterality: Left;  . REPLACEMENT TOTAL KNEE Left 2015    Social History   Socioeconomic History  . Marital status: Married    Spouse name: Not on file  . Number of children: Not on file  . Years of education: Not on file  . Highest education level: Not on file  Occupational History  . Not on file  Tobacco Use  . Smoking status: Former    Current packs/day: 0.00    Average packs/day: 0.5 packs/day for 20.0 years  (10.0 ttl pk-yrs)    Types: Cigarettes    Start date: 75    Quit date: 2002    Years since quitting: 23.6  . Smokeless tobacco: Never  Vaping Use  . Vaping status: Never Used  Substance and Sexual Activity  . Alcohol use: No    Alcohol/week: 0.0 standard drinks of alcohol  . Drug use: No  . Sexual activity: Not on file  Other Topics Concern  . Not on file  Social History Narrative  . Not on file   Social Drivers of Health   Financial Resource Strain: Not on file  Food Insecurity: Not on file  Transportation Needs: Not on file  Physical Activity: Not on file  Stress: Not on file  Social Connections: Not on file  Intimate Partner Violence: Not on file    Family History  Problem Relation Age of Onset  . Heart failure Mother   . Diabetes Mother   . Hyperlipidemia Mother   . Heart attack Father     Allergies  Allergen Reactions  . Hydralazine  Hcl Swelling and Palpitations    Outpatient Medications Prior to Visit  Medication Sig  . amLODipine  (NORVASC ) 10 MG tablet Take 1 tablet (10 mg total) by mouth daily.  . atorvastatin  (LIPITOR) 80 MG tablet TAKE 1 TABLET BY MOUTH EVERYDAY AT BEDTIME  .  Bempedoic Acid-Ezetimibe (NEXLIZET ) 180-10 MG TABS Take 1 tablet by mouth daily.  . buPROPion  (WELLBUTRIN  SR) 150 MG 12 hr tablet TAKE 1 TABLET BY MOUTH TWICE A DAY  . butalbital-acetaminophen -caffeine (FIORICET) 50-325-40 MG tablet Take 1 tablet by mouth 2 (two) times daily as needed for headache.  . clopidogrel  (PLAVIX ) 75 MG tablet TAKE 1 TABLET BY MOUTH EVERY DAY **MAKE FOLLOW UP APPT WITH DR. KHAN FOR REFILLS**  . cyclobenzaprine (FLEXERIL) 10 MG tablet TAKE 1 TABLET BY MOUTH AT BEDTIME FOR MUSCLE RELAXANT **WILL MAKE DROWSY  . levothyroxine  (SYNTHROID ) 25 MCG tablet Take 1 tablet (25 mcg total) by mouth daily before breakfast.  . metoprolol succinate (TOPROL-XL) 25 MG 24 hr tablet TAKE 1 TABLET BY MOUTH EVERY DAY **MAKE FOLLOW UP APPT WITH DR. KHAN FOR REFILLS**  .  nitroGLYCERIN  (NITROSTAT ) 0.4 MG SL tablet Place 1 tablet (0.4 mg total) under the tongue every 5 (five) minutes as needed for chest pain.  . pantoprazole  (PROTONIX ) 40 MG tablet Take 40 mg by mouth daily as needed (acid reflux).   . ramipril  (ALTACE ) 10 MG capsule TAKE 1 CAPSULE BY MOUTH EVERY DAY IN THE MORNING  . [DISCONTINUED] levothyroxine  (SYNTHROID ) 200 MCG tablet TAKE 1 TABLET BY MOUTH EVERY DAY IN THE MORNING WITH 25MG  DOSE  . [DISCONTINUED] traMADol  (ULTRAM ) 50 MG tablet Take 1 tablet (50 mg total) by mouth 3 (three) times daily as needed.  . ranolazine  (RANEXA ) 500 MG 12 hr tablet TAKE 1 TABLET BY MOUTH TWICE A DAY (Patient not taking: Reported on 05/02/2024)   Facility-Administered Medications Prior to Visit  Medication Dose Route Frequency Provider  . iohexol  (OMNIPAQUE ) 180 MG/ML injection 20 mL  20 mL Other Once PRN Myra Lynwood MATSU, MD  . iopamidol  (ISOVUE -M) 41 % intrathecal injection 20 mL  20 mL Other Once PRN Myra Lynwood MATSU, MD  . iopamidol  (ISOVUE -M) 41 % intrathecal injection 20 mL  20 mL Other Once PRN Myra Lynwood MATSU, MD  . iopamidol  (ISOVUE -M) 41 % intrathecal injection 20 mL  20 mL Other Once PRN Myra Lynwood MATSU, MD  . lactated ringers  infusion 1,000 mL  1,000 mL Intravenous Continuous Myra Lynwood MATSU, MD  . sodium chloride  flush (NS) 0.9 % injection 10 mL  10 mL Other Once Myra Lynwood MATSU, MD  . sodium chloride  flush (NS) 0.9 % injection 10 mL  10 mL Other Once Myra Lynwood MATSU, MD  . sodium chloride  flush (NS) 0.9 % injection 3 mL  3 mL Intravenous Q12H Fernand Denyse LABOR, MD    Review of Systems  Constitutional: Negative.   HENT: Negative.    Eyes: Negative.   Respiratory: Negative.    Cardiovascular: Negative.   Gastrointestinal: Negative.   Genitourinary: Negative.   Skin: Negative.   Neurological: Negative.   Endo/Heme/Allergies: Negative.        Objective:   BP 124/78   Pulse 68   Temp 97.6 F (36.4 C)   Ht 5' 2 (1.575 m)   Wt 188 lb 12.8 oz (85.6 kg)    SpO2 98%   BMI 34.53 kg/m   Vitals:   05/02/24 1135  BP: 124/78  Pulse: 68  Temp: 97.6 F (36.4 C)  Height: 5' 2 (1.575 m)  Weight: 188 lb 12.8 oz (85.6 kg)  SpO2: 98%  BMI (Calculated): 34.52    Physical Exam Vitals reviewed.  Constitutional:      General: She is not in acute distress.    Appearance: She is obese.  HENT:  Head: Normocephalic.     Nose: Nose normal.     Mouth/Throat:     Mouth: Mucous membranes are moist.  Eyes:     Extraocular Movements: Extraocular movements intact.     Pupils: Pupils are equal, round, and reactive to light.  Cardiovascular:     Rate and Rhythm: Normal rate and regular rhythm.     Heart sounds: No murmur heard. Pulmonary:     Effort: Pulmonary effort is normal.     Breath sounds: No rhonchi or rales.  Abdominal:     General: Abdomen is flat.     Palpations: There is no hepatomegaly, splenomegaly or mass.  Musculoskeletal:        General: Normal range of motion.     Cervical back: Normal range of motion. No tenderness.     Right lower leg: 1+ Edema present.     Left lower leg: 1+ Edema present.  Skin:    General: Skin is warm and dry.  Neurological:     General: No focal deficit present.     Mental Status: She is alert and oriented to person, place, and time.     Cranial Nerves: No cranial nerve deficit.     Motor: No weakness.  Psychiatric:        Mood and Affect: Mood normal.        Behavior: Behavior normal.      No results found for any visits on 05/02/24.  No results found for this or any previous visit (from the past 2160 hours).    Assessment & Plan:  Paislyn was seen today for pain management.  Other chronic pain -     traMADol  HCl; Take 1 tablet (50 mg total) by mouth 3 (three) times daily as needed.  Dispense: 90 tablet; Refill: 0  Primary hypertension  Coronary artery disease of native artery of native heart with stable angina pectoris (HCC)  Hypothyroidism, unspecified -     Levothyroxine   Sodium; Take 1 tablet (200 mcg total) by mouth daily before breakfast.  Dispense: 90 tablet; Refill: 0 -     TSH  Class 1 obesity due to excess calories with serious comorbidity and body mass index (BMI) of 34.0 to 34.9 in adult -     Tirzepatide -Weight Management; Inject 2.5 mg into the skin once a week.  Dispense: 2 mL; Refill: 1  Alopecia areata -     Iron, TIBC and Ferritin Panel    Problem List Items Addressed This Visit       Cardiovascular and Mediastinum   CAD (coronary artery disease)     Other   Other chronic pain - Primary   Relevant Medications   traMADol  (ULTRAM ) 50 MG tablet   Other Visit Diagnoses       Primary hypertension         Hypothyroidism, unspecified       Relevant Medications   levothyroxine  (SYNTHROID ) 200 MCG tablet   Other Relevant Orders   TSH     Class 1 obesity due to excess calories with serious comorbidity and body mass index (BMI) of 34.0 to 34.9 in adult       Relevant Medications   tirzepatide  (ZEPBOUND ) 2.5 MG/0.5ML injection vial     Alopecia areata       Relevant Orders   Iron, TIBC and Ferritin Panel       Return in about 2 months (around 07/02/2024).   Total time spent: 20 minutes  Sherrill Cinderella Perry, MD  05/02/2024   This document may have been prepared by Dragon Voice Recognition software and as such may include unintentional dictation errors.

## 2024-05-20 ENCOUNTER — Other Ambulatory Visit: Payer: Self-pay | Admitting: Internal Medicine

## 2024-05-20 DIAGNOSIS — I2510 Atherosclerotic heart disease of native coronary artery without angina pectoris: Secondary | ICD-10-CM

## 2024-05-26 ENCOUNTER — Other Ambulatory Visit: Payer: Self-pay | Admitting: Internal Medicine

## 2024-05-27 ENCOUNTER — Other Ambulatory Visit: Payer: Self-pay | Admitting: Internal Medicine

## 2024-05-27 MED ORDER — PANTOPRAZOLE SODIUM 40 MG PO TBEC: 40.0000 mg | DELAYED_RELEASE_TABLET | Freq: Two times a day (BID) | ORAL | 1 refills | Status: AC

## 2024-05-30 ENCOUNTER — Emergency Department: Payer: Self-pay

## 2024-05-30 ENCOUNTER — Emergency Department
Admission: EM | Admit: 2024-05-30 | Discharge: 2024-05-30 | Disposition: A | Discharge status: Home / Self Care | Payer: Self-pay

## 2024-05-30 ENCOUNTER — Other Ambulatory Visit: Payer: Self-pay

## 2024-05-30 DIAGNOSIS — R519 Headache, unspecified: Secondary | ICD-10-CM | POA: Diagnosis not present

## 2024-05-30 DIAGNOSIS — I10 Essential (primary) hypertension: Secondary | ICD-10-CM | POA: Insufficient documentation

## 2024-05-30 DIAGNOSIS — M542 Cervicalgia: Secondary | ICD-10-CM | POA: Diagnosis present

## 2024-05-30 DIAGNOSIS — S161XXA Strain of muscle, fascia and tendon at neck level, initial encounter: Secondary | ICD-10-CM | POA: Insufficient documentation

## 2024-05-30 DIAGNOSIS — Y92410 Unspecified street and highway as the place of occurrence of the external cause: Secondary | ICD-10-CM | POA: Diagnosis not present

## 2024-05-30 DIAGNOSIS — I2510 Atherosclerotic heart disease of native coronary artery without angina pectoris: Secondary | ICD-10-CM | POA: Diagnosis not present

## 2024-05-30 MED ORDER — ACETAMINOPHEN 500 MG PO TABS
1000.0000 mg | ORAL_TABLET | Freq: Once | ORAL | Status: AC
  Administered 2024-05-30: 1000 mg via ORAL
  Filled 2024-05-30: qty 2

## 2024-05-30 MED ORDER — ACETAMINOPHEN 500 MG PO TABS
1000.0000 mg | ORAL_TABLET | Freq: Four times a day (QID) | ORAL | 2 refills | Status: AC | PRN
Start: 2024-05-30 — End: 2025-05-30

## 2024-05-30 MED ORDER — LIDOCAINE 5 % EX PTCH: 1.0000 | MEDICATED_PATCH | CUTANEOUS | 0 refills | Status: AC
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
## 2024-05-30 NOTE — ED Notes (Signed)
 Patient transported to CT

## 2024-05-31 ENCOUNTER — Encounter: Admitting: Internal Medicine

## 2024-06-02 ENCOUNTER — Ambulatory Visit (INDEPENDENT_AMBULATORY_CARE_PROVIDER_SITE_OTHER): Admitting: Cardiovascular Disease

## 2024-06-02 ENCOUNTER — Ambulatory Visit: Admitting: Cardiovascular Disease

## 2024-06-02 ENCOUNTER — Encounter: Payer: Self-pay | Admitting: Cardiovascular Disease

## 2024-06-02 VITALS — BP 118/78 | HR 65 | Ht 62.0 in | Wt 189.0 lb

## 2024-06-02 DIAGNOSIS — I872 Venous insufficiency (chronic) (peripheral): Secondary | ICD-10-CM

## 2024-06-02 DIAGNOSIS — I10 Essential (primary) hypertension: Secondary | ICD-10-CM

## 2024-06-02 DIAGNOSIS — R0602 Shortness of breath: Secondary | ICD-10-CM

## 2024-06-02 DIAGNOSIS — I25118 Atherosclerotic heart disease of native coronary artery with other forms of angina pectoris: Secondary | ICD-10-CM

## 2024-06-02 DIAGNOSIS — I340 Nonrheumatic mitral (valve) insufficiency: Secondary | ICD-10-CM | POA: Diagnosis not present
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
## 2024-06-03 LAB — LIPID PANEL
Chol/HDL Ratio: 3.8 ratio (ref 0.0–4.4)
Cholesterol, Total: 165 mg/dL (ref 100–199)
HDL: 44 mg/dL (ref >39)
LDL Chol Calc (NIH): 86 mg/dL (ref 0–99)
Triglycerides: 210 mg/dL — ABNORMAL HIGH (ref 0–149)
VLDL Cholesterol Cal: 35 mg/dL (ref 5–40)

## 2024-06-03 LAB — IRON,TIBC AND FERRITIN PANEL
Ferritin: 30 ng/mL (ref 15–150)
Iron Saturation: 16 % (ref 15–55)
Iron: 62 ug/dL (ref 27–139)
Total Iron Binding Capacity: 390 ug/dL (ref 250–450)
UIBC: 328 ug/dL (ref 118–369)

## 2024-06-03 LAB — TSH: TSH: 5.8 u[IU]/mL — ABNORMAL HIGH (ref 0.450–4.500)

## 2024-06-06 ENCOUNTER — Ambulatory Visit: Payer: Self-pay | Admitting: Internal Medicine

## 2024-06-06 ENCOUNTER — Other Ambulatory Visit: Payer: Self-pay | Admitting: Internal Medicine

## 2024-06-06 DIAGNOSIS — E039 Hypothyroidism, unspecified: Secondary | ICD-10-CM

## 2024-06-06 MED ORDER — LEVOTHYROXINE SODIUM 50 MCG PO TABS: 50.0000 ug | ORAL_TABLET | Freq: Every day | ORAL | 11 refills | Status: AC

## 2024-06-17 ENCOUNTER — Other Ambulatory Visit: Payer: Self-pay | Admitting: Cardiovascular Disease

## 2024-06-17 DIAGNOSIS — I2510 Atherosclerotic heart disease of native coronary artery without angina pectoris: Secondary | ICD-10-CM

## 2024-06-22 ENCOUNTER — Other Ambulatory Visit: Payer: Self-pay | Admitting: Internal Medicine

## 2024-06-22 DIAGNOSIS — M5430 Sciatica, unspecified side: Secondary | ICD-10-CM

## 2024-06-29 ENCOUNTER — Ambulatory Visit: Admitting: Internal Medicine

## 2024-06-29 VITALS — BP 128/78 | HR 76 | Ht 62.0 in | Wt 191.6 lb

## 2024-06-29 DIAGNOSIS — Z0130 Encounter for examination of blood pressure without abnormal findings: Secondary | ICD-10-CM

## 2024-06-29 DIAGNOSIS — Z23 Encounter for immunization: Secondary | ICD-10-CM | POA: Diagnosis not present

## 2024-06-29 DIAGNOSIS — G8929 Other chronic pain: Secondary | ICD-10-CM

## 2024-06-29 DIAGNOSIS — E66811 Obesity, class 1: Secondary | ICD-10-CM | POA: Diagnosis not present

## 2024-06-29 DIAGNOSIS — Z6834 Body mass index (BMI) 34.0-34.9, adult: Secondary | ICD-10-CM

## 2024-06-29 DIAGNOSIS — G4733 Obstructive sleep apnea (adult) (pediatric): Secondary | ICD-10-CM | POA: Diagnosis not present

## 2024-06-29 DIAGNOSIS — E6609 Other obesity due to excess calories: Secondary | ICD-10-CM

## 2024-06-29 MED ORDER — TRAMADOL HCL 50 MG PO TABS: 50.0000 mg | ORAL_TABLET | Freq: Three times a day (TID) | ORAL | 0 refills | Status: DC | PRN

## 2024-06-29 MED ORDER — FLUCELVAX 0.5 ML IM SUSY: 0.5000 mL | PREFILLED_SYRINGE | Freq: Once | INTRAMUSCULAR | 0 refills | Status: AC

## 2024-06-29 MED ORDER — TIRZEPATIDE-WEIGHT MANAGEMENT 2.5 MG/0.5ML ~~LOC~~ SOLN: 2.5000 mg | SUBCUTANEOUS | 1 refills | Status: AC

## 2024-06-29 NOTE — Progress Notes (Signed)
 Established Patient Office Visit  Subjective:  Patient ID: Christina Zamora, female    DOB: 07/25/1961  Age: 63 y.o. MRN: [id number]  Chief Complaint  Patient presents with   Pain Management    Pain Management    Here for pain management follow up. Chronic pain well controlled on current analgesia. Last drug screen satisfactory and pill counts have also been satisfactory.     No other concerns at this time.   Past Medical History:  Diagnosis Date   Arthritis    CAD (coronary artery disease)    Chronic headaches    GERD (gastroesophageal reflux disease)    Hyperlipidemia    Hypertension    Insomnia    Lumbago    Pulmonary HTN (HCC)    Thyroid disease    Vitamin D deficiency     Past Surgical History:  Procedure Laterality Date   BILATERAL CARPAL TUNNEL RELEASE  2000   BREAST EXCISIONAL BIOPSY Right 06/24/2010   surgical bx fat necrosis   CORONARY ANGIOGRAPHY N/A 05/29/2017   Procedure: CORONARY ANGIOGRAPHY;  Surgeon: Fernand Denyse LABOR, MD;  Location: [facility] INVASIVE CV LAB;  Service: Cardiovascular;  Laterality: N/A;   CORONARY ANGIOPLASTY WITH STENT PLACEMENT     LEFT HEART CATH Left 05/29/2017   Procedure: Left Heart Cath;  Surgeon: Fernand Denyse LABOR, MD;  Location: [facility] INVASIVE CV LAB;  Service: Cardiovascular;  Laterality: Left;   LEFT HEART CATH AND CORONARY ANGIOGRAPHY Left 01/27/2020   Procedure: LEFT HEART CATH AND CORONARY ANGIOGRAPHY;  Surgeon: Fernand Denyse LABOR, MD;  Location: [facility] INVASIVE CV LAB;  Service: Cardiovascular;  Laterality: Left;   REPLACEMENT TOTAL KNEE Left 2015    Social History   Socioeconomic History   Marital status: Married    Spouse name: Not on file   Number of children: Not on file   Years of education: Not on file   Highest education level: Not on file  Occupational History   Not on file  Tobacco Use   Smoking status: Former    Current packs/day: 0.00    Average packs/day: 0.5 packs/day for 20.0 years (10.0 ttl pk-yrs)    Types:  Cigarettes    Start date: 27    Quit date: 2002    Years since quitting: 23.7   Smokeless tobacco: Never  Vaping Use   Vaping status: Never Used  Substance and Sexual Activity   Alcohol use: No    Alcohol/week: 0.0 standard drinks of alcohol   Drug use: No   Sexual activity: Not on file  Other Topics Concern   Not on file  Social History Narrative   Not on file   Social Drivers of Health   Financial Resource Strain: Not on file  Food Insecurity: Not on file  Transportation Needs: Not on file  Physical Activity: Not on file  Stress: Not on file  Social Connections: Not on file  Intimate Partner Violence: Not on file    Family History  Problem Relation Age of Onset   Heart failure Mother    Diabetes Mother    Hyperlipidemia Mother    Heart attack Father     Allergies  Allergen Reactions   Hydralazine  Hcl Swelling and Palpitations    Outpatient Medications Prior to Visit  Medication Sig   acetaminophen  (TYLENOL ) 500 MG tablet Take 2 tablets (1,000 mg total) by mouth every 6 (six) hours as needed.   amLODipine  (NORVASC ) 10 MG tablet Take 1 tablet (10 mg total) by mouth daily.  atorvastatin  (LIPITOR) 80 MG tablet TAKE 1 TABLET BY MOUTH EVERYDAY AT BEDTIME   Bempedoic Acid-Ezetimibe (NEXLIZET ) 180-10 MG TABS Take 1 tablet by mouth daily.   buPROPion  (WELLBUTRIN  SR) 150 MG 12 hr tablet TAKE 1 TABLET BY MOUTH TWICE A DAY   butalbital-acetaminophen -caffeine (FIORICET) 50-325-40 MG tablet Take 1 tablet by mouth 2 (two) times daily as needed for headache.   clopidogrel  (PLAVIX ) 75 MG tablet TAKE 1 TABLET BY MOUTH EVERY DAY **MAKE FOLLOW UP APPT WITH DR. KHAN FOR REFILLS**   cyclobenzaprine (FLEXERIL) 10 MG tablet TAKE 1 TABLET BY MOUTH AT BEDTIME FOR MUSCLE RELAXANT **WILL MAKE DROWSY   levothyroxine  (SYNTHROID ) 200 MCG tablet Take 1 tablet (200 mcg total) by mouth daily before breakfast.   levothyroxine  (SYNTHROID ) 50 MCG tablet Take 1 tablet (50 mcg total) by mouth  daily.   metoprolol succinate (TOPROL-XL) 25 MG 24 hr tablet TAKE 1 TABLET BY MOUTH EVERY DAY **MAKE FOLLOW UP APPT WITH DR. KHAN FOR REFILLS**   nitroGLYCERIN  (NITROSTAT ) 0.4 MG SL tablet Place 1 tablet (0.4 mg total) under the tongue every 5 (five) minutes as needed for chest pain.   pantoprazole  (PROTONIX ) 40 MG tablet Take 1 tablet (40 mg total) by mouth 2 (two) times daily.   ramipril  (ALTACE ) 10 MG capsule TAKE 1 CAPSULE BY MOUTH EVERY DAY IN THE MORNING   traMADol  (ULTRAM ) 50 MG tablet Take 1 tablet (50 mg total) by mouth 3 (three) times daily as needed.   Facility-Administered Medications Prior to Visit  Medication Dose Route Frequency Provider   iohexol  (OMNIPAQUE ) 180 MG/ML injection 20 mL  20 mL Other Once PRN Myra Lynwood MATSU, MD   iopamidol  (ISOVUE -M) 41 % intrathecal injection 20 mL  20 mL Other Once PRN Myra Lynwood MATSU, MD   iopamidol  (ISOVUE -M) 41 % intrathecal injection 20 mL  20 mL Other Once PRN Myra Lynwood MATSU, MD   iopamidol  (ISOVUE -M) 41 % intrathecal injection 20 mL  20 mL Other Once PRN Myra Lynwood MATSU, MD   lactated ringers  infusion 1,000 mL  1,000 mL Intravenous Continuous Myra Lynwood MATSU, MD   sodium chloride  flush (NS) 0.9 % injection 10 mL  10 mL Other Once Myra Lynwood MATSU, MD   sodium chloride  flush (NS) 0.9 % injection 10 mL  10 mL Other Once Myra Lynwood MATSU, MD   sodium chloride  flush (NS) 0.9 % injection 3 mL  3 mL Intravenous Q12H Fernand Denyse LABOR, MD    Review of Systems  Constitutional: Negative.   HENT: Negative.    Eyes: Negative.   Respiratory: Negative.    Gastrointestinal: Negative.   Genitourinary: Negative.   Musculoskeletal: Negative.   Skin: Negative.   Neurological: Negative.   Endo/Heme/Allergies: Negative.   Psychiatric/Behavioral: Negative.    All other systems reviewed and are negative.      Objective:   BP 128/78   Pulse 76   Ht 5' 2 (1.575 m)   Wt 191 lb 9.6 oz (86.9 kg)   SpO2 97%   BMI 35.04 kg/m   Vitals:   06/29/24 0951   BP: 128/78  Pulse: 76  Height: 5' 2 (1.575 m)  Weight: 191 lb 9.6 oz (86.9 kg)  SpO2: 97%  BMI (Calculated): 35.04    Physical Exam Constitutional:      Appearance: Normal appearance.  Cardiovascular:     Rate and Rhythm: Normal rate and regular rhythm.     Heart sounds: Normal heart sounds.  Pulmonary:     Effort: Pulmonary  effort is normal.     Breath sounds: Normal breath sounds.  Musculoskeletal:     Right lower leg: No edema.     Left lower leg: No edema.  Neurological:     Mental Status: She is alert.      No results found for any visits on 06/29/24.  Recent Results (from the past 2160 hours)  Iron, TIBC and Ferritin Panel     Status: None   Collection Time: 06/02/24 11:22 AM  Result Value Ref Range   Total Iron Binding Capacity 390 250 - 450 ug/dL   UIBC 671 881 - 630 ug/dL   Iron 62 27 - 860 ug/dL   Iron Saturation 16 15 - 55 %   Ferritin 30 15 - 150 ng/mL  TSH     Status: Abnormal   Collection Time: 06/02/24 11:22 AM  Result Value Ref Range   TSH 5.800 (H) 0.450 - 4.500 uIU/mL  Lipid panel     Status: Abnormal   Collection Time: 06/02/24 11:22 AM  Result Value Ref Range   Cholesterol, Total 165 100 - 199 mg/dL   Triglycerides 789 (H) 0 - 149 mg/dL   HDL 44 >60 mg/dL   VLDL Cholesterol Cal 35 5 - 40 mg/dL   LDL Chol Calc (NIH) 86 0 - 99 mg/dL   Chol/HDL Ratio 3.8 0.0 - 4.4 ratio    Comment:                                   T. Chol/HDL Ratio                                             Men  Women                               1/2 Avg.Risk  3.4    3.3                                   Avg.Risk  5.0    4.4                                2X Avg.Risk  9.6    7.1                                3X Avg.Risk 23.4   11.0       Assessment & Plan:  Christina Zamora was seen today for pain management.  OSA on CPAP  Class 1 obesity due to excess calories with serious comorbidity and body mass index (BMI) of 34.0 to 34.9 in adult  Other chronic  pain    Problem List Items Addressed This Visit       Other   Other chronic pain   Other Visit Diagnoses       OSA on CPAP    -  Primary     Class 1 obesity due to excess calories with serious comorbidity and body mass index (BMI) of 34.0 to 34.9 in adult  Return in about 2 months (around 08/29/2024).   Total time spent: 20 minutes  Sherrill Cinderella Perry, MD  06/29/2024   This document may have been prepared by [facility] Voice Recognition software and as such may include unintentional dictation errors.

## 2024-06-30 ENCOUNTER — Other Ambulatory Visit (INDEPENDENT_AMBULATORY_CARE_PROVIDER_SITE_OTHER)

## 2024-06-30 DIAGNOSIS — Z23 Encounter for immunization: Secondary | ICD-10-CM

## 2024-07-07 ENCOUNTER — Telehealth: Payer: Self-pay

## 2024-07-07 NOTE — Telephone Encounter (Signed)
 Pt Christina Zamora about resubmitting her PA for zepbound  with OSA as a dx, will try redoing the pa

## 2024-07-29 ENCOUNTER — Other Ambulatory Visit: Payer: Self-pay

## 2024-07-29 ENCOUNTER — Encounter: Admitting: Internal Medicine

## 2024-07-29 DIAGNOSIS — G8929 Other chronic pain: Secondary | ICD-10-CM

## 2024-07-29 MED ORDER — TRAMADOL HCL 50 MG PO TABS: 50.0000 mg | ORAL_TABLET | Freq: Three times a day (TID) | ORAL | 0 refills | Status: DC | PRN

## 2024-07-31 ENCOUNTER — Other Ambulatory Visit: Payer: Self-pay | Admitting: Internal Medicine

## 2024-07-31 DIAGNOSIS — E039 Hypothyroidism, unspecified: Secondary | ICD-10-CM

## 2024-08-02 NOTE — Telephone Encounter (Signed)
 We can discuss at her next visit, I thought she was coming in for PM but its not til next month

## 2024-08-17 ENCOUNTER — Other Ambulatory Visit: Payer: Self-pay | Admitting: Internal Medicine

## 2024-08-17 DIAGNOSIS — I2510 Atherosclerotic heart disease of native coronary artery without angina pectoris: Secondary | ICD-10-CM

## 2024-08-29 ENCOUNTER — Ambulatory Visit (INDEPENDENT_AMBULATORY_CARE_PROVIDER_SITE_OTHER): Admitting: Internal Medicine

## 2024-08-29 VITALS — BP 150/82 | HR 78 | Ht 62.0 in | Wt 192.0 lb

## 2024-08-29 DIAGNOSIS — G8929 Other chronic pain: Secondary | ICD-10-CM | POA: Diagnosis not present

## 2024-08-29 DIAGNOSIS — I10 Essential (primary) hypertension: Secondary | ICD-10-CM

## 2024-08-29 DIAGNOSIS — I25118 Atherosclerotic heart disease of native coronary artery with other forms of angina pectoris: Secondary | ICD-10-CM | POA: Diagnosis not present

## 2024-08-29 MED ORDER — TRAMADOL HCL 50 MG PO TABS: 50.0000 mg | ORAL_TABLET | Freq: Three times a day (TID) | ORAL | 0 refills | Status: AC | PRN

## 2024-08-29 NOTE — Progress Notes (Signed)
 Established Patient Office Visit  Subjective:  Patient ID: Christina Zamora, female    DOB: October 10, 1960  Age: 63 y.o. MRN: [id number]  Chief Complaint  Patient presents with   Pain Management    PM    Here for pain management follow up. Chronic pain well controlled on current analgesia. Last drug screen satisfactory and pill counts not done as she claims she only received 21 pills from the pharmacy.     No other concerns at this time.   Past Medical History:  Diagnosis Date   Arthritis    CAD (coronary artery disease)    Chronic headaches    GERD (gastroesophageal reflux disease)    Hyperlipidemia    Hypertension    Insomnia    Lumbago    Pulmonary HTN (HCC)    Thyroid disease    Vitamin D deficiency     Past Surgical History:  Procedure Laterality Date   BILATERAL CARPAL TUNNEL RELEASE  2000   BREAST EXCISIONAL BIOPSY Right 06/24/2010   surgical bx fat necrosis   CORONARY ANGIOGRAPHY N/A 05/29/2017   Procedure: CORONARY ANGIOGRAPHY;  Surgeon: Fernand Denyse LABOR, MD;  Location: [facility] INVASIVE CV LAB;  Service: Cardiovascular;  Laterality: N/A;   CORONARY ANGIOPLASTY WITH STENT PLACEMENT     LEFT HEART CATH Left 05/29/2017   Procedure: Left Heart Cath;  Surgeon: Fernand Denyse LABOR, MD;  Location: [facility] INVASIVE CV LAB;  Service: Cardiovascular;  Laterality: Left;   LEFT HEART CATH AND CORONARY ANGIOGRAPHY Left 01/27/2020   Procedure: LEFT HEART CATH AND CORONARY ANGIOGRAPHY;  Surgeon: Fernand Denyse LABOR, MD;  Location: [facility] INVASIVE CV LAB;  Service: Cardiovascular;  Laterality: Left;   REPLACEMENT TOTAL KNEE Left 2015    Social History   Socioeconomic History   Marital status: Married    Spouse name: Not on file   Number of children: Not on file   Years of education: Not on file   Highest education level: Not on file  Occupational History   Not on file  Tobacco Use   Smoking status: Former    Current packs/day: 0.00    Average packs/day: 0.5 packs/day for 20.0 years (10.0  ttl pk-yrs)    Types: Cigarettes    Start date: 56    Quit date: 2002    Years since quitting: 23.9   Smokeless tobacco: Never  Vaping Use   Vaping status: Never Used  Substance and Sexual Activity   Alcohol use: No    Alcohol/week: 0.0 standard drinks of alcohol   Drug use: No   Sexual activity: Not on file  Other Topics Concern   Not on file  Social History Narrative   Not on file   Social Drivers of Health   Financial Resource Strain: Not on file  Food Insecurity: Not on file  Transportation Needs: Not on file  Physical Activity: Not on file  Stress: Not on file  Social Connections: Not on file  Intimate Partner Violence: Not on file    Family History  Problem Relation Age of Onset   Heart failure Mother    Diabetes Mother    Hyperlipidemia Mother    Heart attack Father     Allergies  Allergen Reactions   Hydralazine  Hcl Swelling and Palpitations    Outpatient Medications Prior to Visit  Medication Sig   acetaminophen  (TYLENOL ) 500 MG tablet Take 2 tablets (1,000 mg total) by mouth every 6 (six) hours as needed.   amLODipine  (NORVASC ) 10 MG tablet Take  1 tablet (10 mg total) by mouth daily.   atorvastatin  (LIPITOR) 80 MG tablet TAKE 1 TABLET BY MOUTH EVERYDAY AT BEDTIME   Bempedoic Acid-Ezetimibe (NEXLIZET ) 180-10 MG TABS Take 1 tablet by mouth daily.   buPROPion  (WELLBUTRIN  SR) 150 MG 12 hr tablet TAKE 1 TABLET BY MOUTH TWICE A DAY   butalbital-acetaminophen -caffeine (FIORICET) 50-325-40 MG tablet Take 1 tablet by mouth 2 (two) times daily as needed for headache.   clopidogrel  (PLAVIX ) 75 MG tablet TAKE 1 TABLET BY MOUTH EVERY DAY **MAKE FOLLOW UP APPT WITH DR. KHAN FOR REFILLS**   cyclobenzaprine (FLEXERIL) 10 MG tablet TAKE 1 TABLET BY MOUTH AT BEDTIME FOR MUSCLE RELAXANT **WILL MAKE DROWSY   levothyroxine  (SYNTHROID ) 200 MCG tablet Take 1 tablet (200 mcg total) by mouth daily before breakfast.   levothyroxine  (SYNTHROID ) 50 MCG tablet Take 1 tablet (50  mcg total) by mouth daily.   metoprolol succinate (TOPROL-XL) 25 MG 24 hr tablet TAKE 1 TABLET BY MOUTH EVERY DAY **MAKE FOLLOW UP APPT WITH DR. KHAN FOR REFILLS**   nitroGLYCERIN  (NITROSTAT ) 0.4 MG SL tablet Place 1 tablet (0.4 mg total) under the tongue every 5 (five) minutes as needed for chest pain.   pantoprazole  (PROTONIX ) 40 MG tablet Take 1 tablet (40 mg total) by mouth 2 (two) times daily.   ramipril  (ALTACE ) 10 MG capsule TAKE 1 CAPSULE BY MOUTH EVERY DAY IN THE MORNING   traMADol  (ULTRAM ) 50 MG tablet Take 1 tablet (50 mg total) by mouth 3 (three) times daily as needed.   Facility-Administered Medications Prior to Visit  Medication Dose Route Frequency Provider   iohexol  (OMNIPAQUE ) 180 MG/ML injection 20 mL  20 mL Other Once PRN Myra Lynwood MATSU, MD   iopamidol  (ISOVUE -M) 41 % intrathecal injection 20 mL  20 mL Other Once PRN Myra Lynwood MATSU, MD   iopamidol  (ISOVUE -M) 41 % intrathecal injection 20 mL  20 mL Other Once PRN Myra Lynwood MATSU, MD   iopamidol  (ISOVUE -M) 41 % intrathecal injection 20 mL  20 mL Other Once PRN Adams, James G, MD   lactated ringers  infusion 1,000 mL  1,000 mL Intravenous Continuous Myra Lynwood MATSU, MD   sodium chloride  flush (NS) 0.9 % injection 10 mL  10 mL Other Once Myra Lynwood MATSU, MD   sodium chloride  flush (NS) 0.9 % injection 10 mL  10 mL Other Once Myra Lynwood MATSU, MD   sodium chloride  flush (NS) 0.9 % injection 3 mL  3 mL Intravenous Q12H Fernand Denyse LABOR, MD    Review of Systems  Constitutional: Negative.   HENT: Negative.    Eyes: Negative.   Respiratory: Negative.    Gastrointestinal: Negative.   Genitourinary: Negative.   Musculoskeletal: Negative.   Skin: Negative.   Neurological: Negative.   Endo/Heme/Allergies: Negative.   Psychiatric/Behavioral: Negative.    All other systems reviewed and are negative.      Objective:   BP (!) 150/82   Pulse 78   Ht 5' 2 (1.575 m)   Wt 192 lb (87.1 kg)   SpO2 94%   BMI 35.12 kg/m   Vitals:    08/29/24 1016  BP: (!) 150/82  Pulse: 78  Height: 5' 2 (1.575 m)  Weight: 192 lb (87.1 kg)  SpO2: 94%  BMI (Calculated): 35.11    Physical Exam Constitutional:      Appearance: Normal appearance.  Cardiovascular:     Rate and Rhythm: Normal rate and regular rhythm.     Heart sounds: Normal heart sounds.  Pulmonary:     Effort: Pulmonary effort is normal.     Breath sounds: Normal breath sounds.  Musculoskeletal:     Right lower leg: No edema.     Left lower leg: No edema.  Neurological:     Mental Status: She is alert.      No results found for any visits on 08/29/24.  Recent Results (from the past 2160 hours)  Iron, TIBC and Ferritin Panel     Status: None   Collection Time: 06/02/24 11:22 AM  Result Value Ref Range   Total Iron Binding Capacity 390 250 - 450 ug/dL   UIBC 671 881 - 630 ug/dL   Iron 62 27 - 860 ug/dL   Iron Saturation 16 15 - 55 %   Ferritin 30 15 - 150 ng/mL  TSH     Status: Abnormal   Collection Time: 06/02/24 11:22 AM  Result Value Ref Range   TSH 5.800 (H) 0.450 - 4.500 uIU/mL  Lipid panel     Status: Abnormal   Collection Time: 06/02/24 11:22 AM  Result Value Ref Range   Cholesterol, Total 165 100 - 199 mg/dL   Triglycerides 789 (H) 0 - 149 mg/dL   HDL 44 >60 mg/dL   VLDL Cholesterol Cal 35 5 - 40 mg/dL   LDL Chol Calc (NIH) 86 0 - 99 mg/dL   Chol/HDL Ratio 3.8 0.0 - 4.4 ratio    Comment:                                   T. Chol/HDL Ratio                                             Men  Women                               1/2 Avg.Risk  3.4    3.3                                   Avg.Risk  5.0    4.4                                2X Avg.Risk  9.6    7.1                                3X Avg.Risk 23.4   11.0       Assessment & Plan:  Christina Zamora was seen today for pain management.  Other chronic pain  Coronary artery disease of native artery of native heart with stable angina pectoris   She only filled 21 tabs since her last  visit. Her monthly count will be reduced accordingly. Problem List Items Addressed This Visit       Cardiovascular and Mediastinum   CAD (coronary artery disease)     Other   Other chronic pain - Primary    No follow-ups on file.   Total time spent: 20 minutes. This time includes review of previous notes and results and patient face to  face interaction during today'Novice Vrba visit.    Christina Cinderella Perry, MD  08/29/2024   This document may have been prepared by [facility] Voice Recognition software and as such may include unintentional dictation errors.

## 2024-09-02 ENCOUNTER — Telehealth: Payer: Self-pay

## 2024-09-02 ENCOUNTER — Ambulatory Visit: Payer: Self-pay | Admitting: Cardiovascular Disease

## 2024-09-02 ENCOUNTER — Ambulatory Visit (INDEPENDENT_AMBULATORY_CARE_PROVIDER_SITE_OTHER): Admitting: Cardiovascular Disease

## 2024-09-02 ENCOUNTER — Encounter: Payer: Self-pay | Admitting: Cardiovascular Disease

## 2024-09-02 DIAGNOSIS — R0789 Other chest pain: Secondary | ICD-10-CM

## 2024-09-02 DIAGNOSIS — I25118 Atherosclerotic heart disease of native coronary artery with other forms of angina pectoris: Secondary | ICD-10-CM

## 2024-09-02 DIAGNOSIS — Z6834 Body mass index (BMI) 34.0-34.9, adult: Secondary | ICD-10-CM | POA: Diagnosis not present

## 2024-09-02 DIAGNOSIS — E6609 Other obesity due to excess calories: Secondary | ICD-10-CM | POA: Diagnosis not present

## 2024-09-02 DIAGNOSIS — I25112 Atherosclerotic heart disease of native coronary artery with refractory angina pectoris: Secondary | ICD-10-CM

## 2024-09-02 DIAGNOSIS — I340 Nonrheumatic mitral (valve) insufficiency: Secondary | ICD-10-CM | POA: Diagnosis not present

## 2024-09-02 DIAGNOSIS — E66811 Obesity, class 1: Secondary | ICD-10-CM

## 2024-09-02 DIAGNOSIS — R0602 Shortness of breath: Secondary | ICD-10-CM | POA: Insufficient documentation

## 2024-09-02 DIAGNOSIS — I10 Essential (primary) hypertension: Secondary | ICD-10-CM | POA: Diagnosis not present

## 2024-09-02 LAB — CBC WITH DIFFERENTIAL/PLATELET
Basophils Absolute: 0 x10E3/uL (ref 0.0–0.2)
Basos: 1 %
EOS (ABSOLUTE): 0.2 x10E3/uL (ref 0.0–0.4)
Eos: 3 %
Hematocrit: 42.2 % (ref 34.0–46.6)
Hemoglobin: 13.4 g/dL (ref 11.1–15.9)
Immature Grans (Abs): 0 x10E3/uL (ref 0.0–0.1)
Immature Granulocytes: 0 %
Lymphocytes Absolute: 2.8 x10E3/uL (ref 0.7–3.1)
Lymphs: 39 %
MCH: 27.6 pg (ref 26.6–33.0)
MCHC: 31.8 g/dL (ref 31.5–35.7)
MCV: 87 fL (ref 79–97)
Monocytes Absolute: 0.6 x10E3/uL (ref 0.1–0.9)
Monocytes: 8 %
Neutrophils Absolute: 3.5 x10E3/uL (ref 1.4–7.0)
Neutrophils: 49 %
Platelets: 331 x10E3/uL (ref 150–450)
RBC: 4.86 x10E6/uL (ref 3.77–5.28)
RDW: 12.7 % (ref 11.7–15.4)
WBC: 7.1 x10E3/uL (ref 3.4–10.8)

## 2024-09-02 NOTE — Telephone Encounter (Signed)
 What all do you want in the patients letter, pt was in office for cards today and said that they were only filling for the21 tablets bc that's what her insurance would pay for they wouldn't pay for the whole rx since the PA was denied, pt states she is still wanting to take the tramadol  tid if possible she will pay cash, I gave her a good rx card and [employer] card to give to the pharmacist

## 2024-09-02 NOTE — Progress Notes (Signed)
 Cardiology Office Note   Date:  09/02/2024   ID:  Christina Zamora, DOB 04-Oct-1960, MRN [id number]  PCP:  [facility]  Cardiologist:  Christina Bathe, MD      History of Present Illness: Christina Zamora is a 63 y.o. female who presents for  Chief Complaint  Patient presents with   Follow-up    3 month follow up    Had chest pain woke her up needing 2 nitro , but no chest pain in office.      Past Medical History:  Diagnosis Date   Arthritis    CAD (coronary artery disease)    Chronic headaches    GERD (gastroesophageal reflux disease)    Hyperlipidemia    Hypertension    Insomnia    Lumbago    Pulmonary HTN (HCC)    Thyroid disease    Vitamin D deficiency      Past Surgical History:  Procedure Laterality Date   BILATERAL CARPAL TUNNEL RELEASE  2000   BREAST EXCISIONAL BIOPSY Right 06/24/2010   surgical bx fat necrosis   CORONARY ANGIOGRAPHY N/A 05/29/2017   Procedure: CORONARY ANGIOGRAPHY;  Surgeon: Zamora Christina DELENA, MD;  Location: [facility] INVASIVE CV LAB;  Service: Cardiovascular;  Laterality: N/A;   CORONARY ANGIOPLASTY WITH STENT PLACEMENT     LEFT HEART CATH Left 05/29/2017   Procedure: Left Heart Cath;  Surgeon: Zamora Christina DELENA, MD;  Location: [facility] INVASIVE CV LAB;  Service: Cardiovascular;  Laterality: Left;   LEFT HEART CATH AND CORONARY ANGIOGRAPHY Left 01/27/2020   Procedure: LEFT HEART CATH AND CORONARY ANGIOGRAPHY;  Surgeon: Zamora Christina DELENA, MD;  Location: [facility] INVASIVE CV LAB;  Service: Cardiovascular;  Laterality: Left;   REPLACEMENT TOTAL KNEE Left 2015     Current Outpatient Medications  Medication Sig Dispense Refill   amLODipine  (NORVASC ) 10 MG tablet Take 1 tablet (10 mg total) by mouth daily. 90 tablet 1   atorvastatin  (LIPITOR) 80 MG tablet TAKE 1 TABLET BY MOUTH EVERYDAY AT BEDTIME 90 tablet 0   Bempedoic Acid-Ezetimibe (NEXLIZET ) 180-10 MG TABS Take 1 tablet by mouth daily. 90 tablet 1   buPROPion  (WELLBUTRIN  SR) 150 MG 12  hr tablet TAKE 1 TABLET BY MOUTH TWICE A DAY 180 tablet 1   butalbital-acetaminophen -caffeine (FIORICET) 50-325-40 MG tablet Take 1 tablet by mouth 2 (two) times daily as needed for headache.     clopidogrel  (PLAVIX ) 75 MG tablet TAKE 1 TABLET BY MOUTH EVERY DAY **MAKE FOLLOW UP APPT WITH DR. Chesnie Capell FOR REFILLS** 90 tablet 2   cyclobenzaprine (FLEXERIL) 10 MG tablet TAKE 1 TABLET BY MOUTH AT BEDTIME FOR MUSCLE RELAXANT **WILL MAKE DROWSY 30 tablet 3   levothyroxine  (SYNTHROID ) 200 MCG tablet Take 1 tablet (200 mcg total) by mouth daily before breakfast. 90 tablet 0   levothyroxine  (SYNTHROID ) 50 MCG tablet Take 1 tablet (50 mcg total) by mouth daily. 30 tablet 11   metoprolol succinate (TOPROL-XL) 25 MG 24 hr tablet TAKE 1 TABLET BY MOUTH EVERY DAY **MAKE FOLLOW UP APPT WITH DR. Makeila Yamaguchi FOR REFILLS** 90 tablet 4   nitroGLYCERIN  (NITROSTAT ) 0.4 MG SL tablet Place 1 tablet (0.4 mg total) under the tongue every 5 (five) minutes as needed for chest pain. 10 tablet 0   pantoprazole  (PROTONIX ) 40 MG tablet Take 1 tablet (40 mg total) by mouth 2 (two) times daily. 180 tablet 1   ramipril  (ALTACE ) 10 MG capsule TAKE 1 CAPSULE BY MOUTH EVERY DAY IN THE MORNING 90 capsule 1  traMADol  (ULTRAM ) 50 MG tablet Take 1 tablet (50 mg total) by mouth 3 (three) times daily as needed for up to 7 days. 21 tablet 0   acetaminophen  (TYLENOL ) 500 MG tablet Take 2 tablets (1,000 mg total) by mouth every 6 (six) hours as needed. (Patient not taking: Reported on 09/02/2024) 100 tablet 2   Current Facility-Administered Medications  Medication Dose Route Frequency Provider Last Rate Last Admin   iohexol  (OMNIPAQUE ) 180 MG/ML injection 20 mL  20 mL Other Once PRN Christina Lynwood MATSU, MD   4 mL at 12/05/15 1545   iopamidol  (ISOVUE -M) 41 % intrathecal injection 20 mL  20 mL Other Once PRN Christina Lynwood MATSU, MD       iopamidol  (ISOVUE -M) 41 % intrathecal injection 20 mL  20 mL Other Once PRN Christina Lynwood MATSU, MD       iopamidol  (ISOVUE -M) 41 %  intrathecal injection 20 mL  20 mL Other Once PRN Christina Lynwood MATSU, MD       lactated ringers  infusion 1,000 mL  1,000 mL Intravenous Continuous Christina Lynwood MATSU, MD       sodium chloride  flush (NS) 0.9 % injection 10 mL  10 mL Other Once Christina Lynwood MATSU, MD       sodium chloride  flush (NS) 0.9 % injection 10 mL  10 mL Other Once Christina Lynwood MATSU, MD       Facility-Administered Medications Ordered in Other Visits  Medication Dose Route Frequency Provider Last Rate Last Admin   sodium chloride  flush (NS) 0.9 % injection 3 mL  3 mL Intravenous Q12H Christina Zamora LABOR, MD        Allergies:   Hydralazine  hcl    Social History:   reports that she quit smoking about 23 years ago. Her smoking use included cigarettes. She started smoking about 43 years ago. She has a 10 pack-year smoking history. She has never used smokeless tobacco. She reports that she does not drink alcohol and does not use drugs.   Family History:  family history includes Diabetes in her mother; Heart attack in her father; Heart failure in her mother; Hyperlipidemia in her mother.    ROS:     Review of Systems  Constitutional: Negative.   HENT: Negative.    Eyes: Negative.   Respiratory: Negative.    Gastrointestinal: Negative.   Genitourinary: Negative.   Musculoskeletal: Negative.   Skin: Negative.   Neurological: Negative.   Endo/Heme/Allergies: Negative.   Psychiatric/Behavioral: Negative.    All other systems reviewed and are negative.     All other systems are reviewed and negative.    PHYSICAL EXAM: VS:  BP 126/80   Pulse 67   Ht 5' 2 (1.575 m)   Wt 193 lb (87.5 kg)   SpO2 99%   BMI 35.30 kg/m  , BMI Body mass index is 35.3 kg/m. Last weight:  Wt Readings from Last 3 Encounters:  09/02/24 193 lb (87.5 kg)  08/29/24 192 lb (87.1 kg)  06/29/24 191 lb 9.6 oz (86.9 kg)     Physical Exam Constitutional:      Appearance: Normal appearance.  Cardiovascular:     Rate and Rhythm: Normal rate and regular  rhythm.     Heart sounds: Normal heart sounds.  Pulmonary:     Effort: Pulmonary effort is normal.     Breath sounds: Normal breath sounds.  Musculoskeletal:     Right lower leg: No edema.     Left lower leg: No edema.  Neurological:     Mental Status: She is alert.       EKG:   Recent Labs: 06/02/2024: TSH 5.800    Lipid Panel    Component Value Date/Time   CHOL 165 06/02/2024 1122   CHOL 222 (H) 03/31/2013 0444   TRIG 210 (H) 06/02/2024 1122   TRIG 198 03/31/2013 0444   HDL 44 06/02/2024 1122   HDL 45 03/31/2013 0444   CHOLHDL 3.8 06/02/2024 1122   CHOLHDL 4.1 08/07/2009 0425   VLDL 40 03/31/2013 0444   LDLCALC 86 06/02/2024 1122   LDLCALC 137 (H) 03/31/2013 0444      Other studies Reviewed: Additional studies/ records that were reviewed today include:  Review of the above records demonstrates:       No data to display            ASSESSMENT AND PLAN:    ICD-10-CM   1. 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 Stress test 5/25 normal. Has chest pain  at night needing nito . Will do cath.    2. Coronary artery disease of native artery of native heart with stable angina pectoris  I25.118 Procedural/ Surgical Case Request: LEFT HEART CATH AND CORONARY ANGIOGRAPHY with possible coronary intervention    Comprehensive metabolic panel    CBC with Differential/Platelet    3. Nonrheumatic mitral valve regurgitation  I34.0 Procedural/ Surgical Case Request: LEFT HEART CATH AND CORONARY ANGIOGRAPHY with possible coronary intervention    Comprehensive metabolic panel    CBC with Differential/Platelet    4. SOB (shortness of breath)  R06.02 Procedural/  Surgical Case Request: LEFT HEART CATH AND CORONARY ANGIOGRAPHY with possible coronary intervention    Comprehensive metabolic panel    CBC with Differential/Platelet    5. Primary hypertension  I10 Procedural/ Surgical Case Request: LEFT HEART CATH AND CORONARY ANGIOGRAPHY with possible coronary intervention    Comprehensive metabolic panel    CBC with Differential/Platelet    6. Class 1 obesity due to excess calories with serious comorbidity and body mass index (BMI) of 34.0 to 34.9 in adult  E66.811 Procedural/ Surgical Case Request: LEFT HEART CATH AND CORONARY ANGIOGRAPHY with possible coronary intervention   E66.09 Comprehensive metabolic panel   S31.65 CBC with Differential/Platelet    7. Other chest pain  R07.89 Procedural/ Surgical Case Request: LEFT HEART CATH AND CORONARY ANGIOGRAPHY with possible coronary intervention    Comprehensive metabolic panel    CBC with Differential/Platelet       Problem List Items Addressed This Visit       Cardiovascular and Mediastinum   CAD (coronary artery disease) - Primary   Relevant Orders   Procedural/ Surgical Case Request: LEFT HEART CATH AND CORONARY ANGIOGRAPHY with possible coronary intervention   Comprehensive metabolic panel   CBC with Differential/Platelet   Procedural/ Surgical Case Request: LEFT HEART CATH AND CORONARY ANGIOGRAPHY with possible coronary intervention   Comprehensive metabolic panel   CBC with Differential/Platelet   Nonrheumatic mitral valve regurgitation   Relevant Orders   Procedural/ Surgical Case Request: LEFT HEART CATH AND CORONARY ANGIOGRAPHY with possible coronary intervention   Comprehensive metabolic panel   CBC with Differential/Platelet   Primary hypertension   Relevant Orders  Procedural/ Surgical Case Request: LEFT HEART CATH AND CORONARY ANGIOGRAPHY with possible coronary intervention   Comprehensive metabolic panel   CBC with Differential/Platelet     Other   Chest pain   Relevant  Orders   Procedural/ Surgical Case Request: LEFT HEART CATH AND CORONARY ANGIOGRAPHY with possible coronary intervention   Comprehensive metabolic panel   CBC with Differential/Platelet   SOB (shortness of breath)   Relevant Orders   Procedural/ Surgical Case Request: LEFT HEART CATH AND CORONARY ANGIOGRAPHY with possible coronary intervention   Comprehensive metabolic panel   CBC with Differential/Platelet   Class 1 obesity due to excess calories with serious comorbidity and body mass index (BMI) of 34.0 to 34.9 in adult   Relevant Orders   Procedural/ Surgical Case Request: LEFT HEART CATH AND CORONARY ANGIOGRAPHY with possible coronary intervention   Comprehensive metabolic panel   CBC with Differential/Platelet       Disposition:   Return in about 2 weeks (around 09/16/2024) for left heart cath next week.    Total time spent: 50 minutes  Signed,  Christina Bathe, MD  09/02/2024 11:00 AM    [facility]

## 2024-09-03 LAB — COMPREHENSIVE METABOLIC PANEL WITH GFR
ALT: 26 IU/L (ref 0–32)
AST: 29 IU/L (ref 0–40)
Albumin: 4.6 g/dL (ref 3.9–4.9)
Alkaline Phosphatase: 129 IU/L (ref 49–135)
BUN/Creatinine Ratio: 15 (ref 12–28)
BUN: 17 mg/dL (ref 8–27)
Bilirubin Total: 0.4 mg/dL (ref 0.0–1.2)
CO2: 23 mmol/L (ref 20–29)
Calcium: 9.7 mg/dL (ref 8.7–10.3)
Chloride: 105 mmol/L (ref 96–106)
Creatinine, Ser: 1.11 mg/dL — ABNORMAL HIGH (ref 0.57–1.00)
Globulin, Total: 2.9 g/dL (ref 1.5–4.5)
Glucose: 97 mg/dL (ref 70–99)
Potassium: 4.5 mmol/L (ref 3.5–5.2)
Sodium: 143 mmol/L (ref 134–144)
Total Protein: 7.5 g/dL (ref 6.0–8.5)
eGFR: 56 mL/min/1.73 — ABNORMAL LOW (ref >59)

## 2024-09-12 ENCOUNTER — Other Ambulatory Visit: Payer: Self-pay

## 2024-09-12 DIAGNOSIS — G8929 Other chronic pain: Secondary | ICD-10-CM

## 2024-09-14 ENCOUNTER — Other Ambulatory Visit: Payer: Self-pay | Admitting: Cardiovascular Disease

## 2024-09-20 ENCOUNTER — Telehealth: Payer: Self-pay

## 2024-09-20 NOTE — Telephone Encounter (Signed)
 Patient called today stating that she has a heart cath the same day as our pain management  on 01/06 what would you like the patient to do about her appt?

## 2024-09-23 NOTE — Telephone Encounter (Signed)
 Patient informed and appt moved to first avail on 01/12

## 2024-09-27 ENCOUNTER — Encounter: Admitting: Internal Medicine

## 2024-09-27 ENCOUNTER — Encounter
Admission: RE | Disposition: A | Discharge status: Home / Self Care | Payer: Self-pay | Source: Home / Self Care | Attending: Cardiovascular Disease

## 2024-09-27 ENCOUNTER — Encounter: Payer: Self-pay | Admitting: Cardiovascular Disease

## 2024-09-27 ENCOUNTER — Ambulatory Visit
Admission: RE | Admit: 2024-09-27 | Discharge: 2024-09-27 | Disposition: A | Discharge status: Home / Self Care | Attending: Cardiovascular Disease | Admitting: Cardiovascular Disease

## 2024-09-27 ENCOUNTER — Other Ambulatory Visit: Payer: Self-pay

## 2024-09-27 DIAGNOSIS — Z7982 Long term (current) use of aspirin: Secondary | ICD-10-CM | POA: Diagnosis not present

## 2024-09-27 DIAGNOSIS — R0602 Shortness of breath: Secondary | ICD-10-CM | POA: Diagnosis not present

## 2024-09-27 DIAGNOSIS — Z87891 Personal history of nicotine dependence: Secondary | ICD-10-CM | POA: Diagnosis not present

## 2024-09-27 DIAGNOSIS — I25112 Atherosclerotic heart disease of native coronary artery with refractory angina pectoris: Secondary | ICD-10-CM | POA: Diagnosis not present

## 2024-09-27 DIAGNOSIS — Z79899 Other long term (current) drug therapy: Secondary | ICD-10-CM | POA: Insufficient documentation

## 2024-09-27 DIAGNOSIS — I340 Nonrheumatic mitral (valve) insufficiency: Secondary | ICD-10-CM | POA: Insufficient documentation

## 2024-09-27 DIAGNOSIS — R079 Chest pain, unspecified: Secondary | ICD-10-CM | POA: Insufficient documentation

## 2024-09-27 DIAGNOSIS — I10 Essential (primary) hypertension: Secondary | ICD-10-CM | POA: Diagnosis not present

## 2024-09-27 DIAGNOSIS — R0789 Other chest pain: Secondary | ICD-10-CM

## 2024-09-27 DIAGNOSIS — I2584 Coronary atherosclerosis due to calcified coronary lesion: Secondary | ICD-10-CM | POA: Diagnosis not present

## 2024-09-27 DIAGNOSIS — Z6834 Body mass index (BMI) 34.0-34.9, adult: Secondary | ICD-10-CM | POA: Insufficient documentation

## 2024-09-27 DIAGNOSIS — E6609 Other obesity due to excess calories: Secondary | ICD-10-CM

## 2024-09-27 DIAGNOSIS — I25118 Atherosclerotic heart disease of native coronary artery with other forms of angina pectoris: Secondary | ICD-10-CM | POA: Diagnosis not present

## 2024-09-27 DIAGNOSIS — E66811 Obesity, class 1: Secondary | ICD-10-CM | POA: Insufficient documentation

## 2024-09-27 DIAGNOSIS — Z7902 Long term (current) use of antithrombotics/antiplatelets: Secondary | ICD-10-CM | POA: Insufficient documentation

## 2024-09-27 DIAGNOSIS — I2510 Atherosclerotic heart disease of native coronary artery without angina pectoris: Secondary | ICD-10-CM | POA: Insufficient documentation

## 2024-09-27 HISTORY — PX: LEFT HEART CATH AND CORONARY ANGIOGRAPHY: CATH118249

## 2024-09-27 SURGERY — LEFT HEART CATH AND CORONARY ANGIOGRAPHY
Anesthesia: Moderate Sedation | Laterality: Right

## 2024-09-27 MED ORDER — SODIUM CHLORIDE 0.9 % IV SOLN: 250.0000 mL | INTRAVENOUS | Status: DC | PRN

## 2024-09-27 MED ORDER — SODIUM CHLORIDE 0.9 % IV SOLN: INTRAVENOUS | Status: DC

## 2024-09-27 MED ORDER — LIDOCAINE HCL (PF) 1 % IJ SOLN
INTRAMUSCULAR | Status: DC | PRN
  Administered 2024-09-27: 20 mL

## 2024-09-27 MED ORDER — MIDAZOLAM HCL (PF) 2 MG/2ML IJ SOLN
INTRAMUSCULAR | Status: DC | PRN
  Administered 2024-09-27: 1 mg via INTRAVENOUS

## 2024-09-27 MED ORDER — FENTANYL CITRATE (PF) 100 MCG/2ML IJ SOLN
INTRAMUSCULAR | Status: AC
  Filled 2024-09-27: qty 2

## 2024-09-27 MED ORDER — MIDAZOLAM HCL 2 MG/2ML IJ SOLN
INTRAMUSCULAR | Status: AC
  Filled 2024-09-27: qty 2

## 2024-09-27 MED ORDER — FENTANYL CITRATE (PF) 100 MCG/2ML IJ SOLN
INTRAMUSCULAR | Status: DC | PRN
  Administered 2024-09-27: 25 ug via INTRAVENOUS
  Administered 2024-09-27: 50 ug via INTRAVENOUS

## 2024-09-27 MED ORDER — SODIUM CHLORIDE 0.9% FLUSH: 3.0000 mL | Freq: Two times a day (BID) | INTRAVENOUS | Status: DC

## 2024-09-27 MED ORDER — SODIUM CHLORIDE 0.9% FLUSH: 3.0000 mL | INTRAVENOUS | Status: DC | PRN

## 2024-09-27 MED ORDER — HEPARIN (PORCINE) IN NACL 1000-0.9 UT/500ML-% IV SOLN
INTRAVENOUS | Status: AC
  Filled 2024-09-27: qty 1000

## 2024-09-27 MED ORDER — HEPARIN (PORCINE) IN NACL 1000-0.9 UT/500ML-% IV SOLN
INTRAVENOUS | Status: DC | PRN
  Administered 2024-09-27 (×2): 500 mL

## 2024-09-27 MED ORDER — ASPIRIN 81 MG PO CHEW
CHEWABLE_TABLET | ORAL | Status: AC
  Filled 2024-09-27: qty 1

## 2024-09-27 MED ORDER — ONDANSETRON HCL 4 MG/2ML IJ SOLN: 4.0000 mg | Freq: Four times a day (QID) | INTRAMUSCULAR | Status: DC | PRN

## 2024-09-27 MED ORDER — LIDOCAINE HCL 1 % IJ SOLN
INTRAMUSCULAR | Status: AC
  Filled 2024-09-27: qty 20

## 2024-09-27 MED ORDER — SODIUM CHLORIDE 0.9 % WEIGHT BASED INFUSION
1.0000 mL/kg/h | INTRAVENOUS | Status: DC
  Administered 2024-09-27: 1 mL/kg/h via INTRAVENOUS

## 2024-09-27 MED ORDER — LABETALOL HCL 5 MG/ML IV SOLN: 10.0000 mg | INTRAVENOUS | Status: DC | PRN

## 2024-09-27 MED ORDER — IOHEXOL 300 MG/ML  SOLN
INTRAMUSCULAR | Status: DC | PRN
  Administered 2024-09-27: 77 mL

## 2024-09-27 MED ORDER — ASPIRIN 81 MG PO CHEW
81.0000 mg | CHEWABLE_TABLET | ORAL | Status: AC
  Administered 2024-09-27: 81 mg via ORAL

## 2024-09-27 MED ORDER — ACETAMINOPHEN 325 MG PO TABS: 650.0000 mg | ORAL_TABLET | ORAL | Status: DC | PRN

## 2024-09-27 MED ORDER — HYDRALAZINE HCL 20 MG/ML IJ SOLN: 10.0000 mg | INTRAMUSCULAR | Status: DC | PRN

## 2024-09-27 MED ORDER — SODIUM CHLORIDE 0.9 % IV BOLUS
500.0000 mL | Freq: Once | INTRAVENOUS | Status: AC
  Administered 2024-09-27: 500 mL via INTRAVENOUS

## 2024-09-27 SURGICAL SUPPLY — 9 items
CATH INFINITI 5FR MULTPACK ANG (CATHETERS) IMPLANT
DEVICE CLOSURE MYNXGRIP 5F (Vascular Products) IMPLANT
KIT SYRINGE INJ CVI SPIKEX1 (MISCELLANEOUS) IMPLANT
NEEDLE PERC 18GX7CM (NEEDLE) IMPLANT
PACK CARDIAC CATH (CUSTOM PROCEDURE TRAY) ×1 IMPLANT
SET ATX-X65L (MISCELLANEOUS) IMPLANT
SHEATH AVANTI 5FR X 11CM (SHEATH) IMPLANT
STATION PROTECTION PRESSURIZED (MISCELLANEOUS) IMPLANT
WIRE GUIDERIGHT .035X150 (WIRE) IMPLANT

## 2024-09-27 NOTE — Discharge Instructions (Signed)

## 2024-09-30 ENCOUNTER — Ambulatory Visit (INDEPENDENT_AMBULATORY_CARE_PROVIDER_SITE_OTHER): Admitting: Cardiovascular Disease

## 2024-09-30 ENCOUNTER — Encounter: Payer: Self-pay | Admitting: Cardiovascular Disease

## 2024-09-30 VITALS — BP 92/58 | HR 77 | Ht 62.0 in | Wt 192.0 lb

## 2024-09-30 DIAGNOSIS — R0789 Other chest pain: Secondary | ICD-10-CM | POA: Diagnosis not present

## 2024-09-30 DIAGNOSIS — E6609 Other obesity due to excess calories: Secondary | ICD-10-CM | POA: Diagnosis not present

## 2024-09-30 DIAGNOSIS — I340 Nonrheumatic mitral (valve) insufficiency: Secondary | ICD-10-CM

## 2024-09-30 DIAGNOSIS — E66811 Obesity, class 1: Secondary | ICD-10-CM | POA: Diagnosis not present

## 2024-09-30 DIAGNOSIS — I10 Essential (primary) hypertension: Secondary | ICD-10-CM | POA: Diagnosis not present

## 2024-09-30 DIAGNOSIS — R0602 Shortness of breath: Secondary | ICD-10-CM

## 2024-09-30 DIAGNOSIS — I25112 Atherosclerotic heart disease of native coronary artery with refractory angina pectoris: Secondary | ICD-10-CM

## 2024-09-30 DIAGNOSIS — I25118 Atherosclerotic heart disease of native coronary artery with other forms of angina pectoris: Secondary | ICD-10-CM | POA: Diagnosis not present

## 2024-09-30 DIAGNOSIS — Z6834 Body mass index (BMI) 34.0-34.9, adult: Secondary | ICD-10-CM | POA: Diagnosis not present

## 2024-09-30 DIAGNOSIS — G8929 Other chronic pain: Secondary | ICD-10-CM

## 2024-09-30 NOTE — Progress Notes (Signed)
 "     Cardiology Office Note   Date:  09/30/2024   ID:  Christina Zamora, DOB July 28, 1961, MRN [id number]  PCP:  [facility]  Cardiologist:  Denyse Bathe, MD      History of Present Illness: Christina Zamora is a 64 y.o. female who presents for  Chief Complaint  Patient presents with   Follow-up    Cath Follow Up    Occasional chest pains. Spoke to Dr. Katina and was suppose to have Atherctomy of high grade lesion next Tuesday at [facility]. But no body called her.      Past Medical History:  Diagnosis Date   Arthritis    CAD (coronary artery disease)    Chronic headaches    GERD (gastroesophageal reflux disease)    Hyperlipidemia    Hypertension    Insomnia    Lumbago    Pulmonary HTN (HCC)    Thyroid disease    Vitamin D deficiency      Past Surgical History:  Procedure Laterality Date   BILATERAL CARPAL TUNNEL RELEASE  2000   BREAST EXCISIONAL BIOPSY Right 06/24/2010   surgical bx fat necrosis   CORONARY ANGIOGRAPHY N/A 05/29/2017   Procedure: CORONARY ANGIOGRAPHY;  Surgeon: Bathe Denyse DELENA, MD;  Location: [facility] INVASIVE CV LAB;  Service: Cardiovascular;  Laterality: N/A;   CORONARY ANGIOPLASTY WITH STENT PLACEMENT     LEFT HEART CATH Left 05/29/2017   Procedure: Left Heart Cath;  Surgeon: Bathe Denyse DELENA, MD;  Location: [facility] INVASIVE CV LAB;  Service: Cardiovascular;  Laterality: Left;   LEFT HEART CATH AND CORONARY ANGIOGRAPHY Left 01/27/2020   Procedure: LEFT HEART CATH AND CORONARY ANGIOGRAPHY;  Surgeon: Bathe Denyse DELENA, MD;  Location: [facility] INVASIVE CV LAB;  Service: Cardiovascular;  Laterality: Left;   LEFT HEART CATH AND CORONARY ANGIOGRAPHY Right 09/27/2024   Procedure: LEFT HEART CATH AND CORONARY ANGIOGRAPHY with possible coronary intervention;  Surgeon: Bathe Denyse DELENA, MD;  Location: [facility] INVASIVE CV LAB;  Service: Cardiovascular;  Laterality: Right;   REPLACEMENT TOTAL KNEE Left 2015     Current Outpatient Medications  Medication Sig Dispense  Refill   acetaminophen  (TYLENOL ) 500 MG tablet Take 2 tablets (1,000 mg total) by mouth every 6 (six) hours as needed. 100 tablet 2   amLODipine  (NORVASC ) 10 MG tablet Take 1 tablet (10 mg total) by mouth daily. (Patient taking differently: Take 10 mg by mouth at bedtime.) 90 tablet 1   atorvastatin  (LIPITOR) 80 MG tablet TAKE 1 TABLET BY MOUTH EVERYDAY AT BEDTIME 90 tablet 0   Bempedoic Acid-Ezetimibe (NEXLIZET ) 180-10 MG TABS Take 1 tablet by mouth daily. (Patient not taking: Reported on 09/19/2024) 90 tablet 1   buPROPion  (WELLBUTRIN  SR) 150 MG 12 hr tablet TAKE 1 TABLET BY MOUTH TWICE A DAY 180 tablet 1   butalbital-acetaminophen -caffeine (FIORICET) 50-325-40 MG tablet Take 1 tablet by mouth 2 (two) times daily as needed for headache.     Calcium -Magnesium-Zinc (CAL-MAG-ZINC PO) Take 1 tablet by mouth daily.     clopidogrel  (PLAVIX ) 75 MG tablet TAKE 1 TABLET BY MOUTH EVERY DAY **MAKE FOLLOW UP APPT WITH DR. Reshard Guillet FOR REFILLS** 90 tablet 2   cyclobenzaprine (FLEXERIL) 10 MG tablet TAKE 1 TABLET BY MOUTH AT BEDTIME FOR MUSCLE RELAXANT **WILL MAKE DROWSY 30 tablet 3   levothyroxine  (SYNTHROID ) 200 MCG tablet Take 1 tablet (200 mcg total) by mouth daily before breakfast. (Patient taking differently: Take 200 mcg by mouth See admin instructions. Take with 50 mcg for  a total of 250 mcg with in the morning) 90 tablet 0   levothyroxine  (SYNTHROID ) 50 MCG tablet Take 1 tablet (50 mcg total) by mouth daily. (Patient taking differently: Take 50 mcg by mouth See admin instructions. Take with 200 mcg for a total of 250 mcg in the morning) 30 tablet 11   metoprolol succinate (TOPROL-XL) 25 MG 24 hr tablet TAKE 1 TABLET BY MOUTH EVERY DAY **MAKE FOLLOW UP APPT WITH DR. Rmani Kapusta FOR REFILLS** (Patient taking differently: 25 mg at bedtime.) 90 tablet 4   Multiple Vitamins-Minerals (MULTIVITAMIN WITH MINERALS) tablet Take 1 tablet by mouth daily.     nitroGLYCERIN  (NITROSTAT ) 0.4 MG SL tablet Place 1 tablet (0.4 mg  total) under the tongue every 5 (five) minutes as needed for chest pain. 10 tablet 0   pantoprazole  (PROTONIX ) 40 MG tablet Take 1 tablet (40 mg total) by mouth 2 (two) times daily. (Patient taking differently: Take 40 mg by mouth 2 (two) times daily as needed (Heartburn).) 180 tablet 1   ramipril  (ALTACE ) 10 MG capsule TAKE 1 CAPSULE BY MOUTH EVERY DAY IN THE MORNING 90 capsule 1   traMADol  (ULTRAM ) 50 MG tablet Take 1 tablet (50 mg total) by mouth 3 (three) times daily as needed for up to 7 days. (Patient taking differently: Take 50 mg by mouth 3 (three) times daily.) 21 tablet 0   Current Facility-Administered Medications  Medication Dose Route Frequency Provider Last Rate Last Admin   iohexol  (OMNIPAQUE ) 180 MG/ML injection 20 mL  20 mL Other Once PRN Adams, James G, MD   4 mL at 12/05/15 1545   iopamidol  (ISOVUE -M) 41 % intrathecal injection 20 mL  20 mL Other Once PRN Myra Lynwood MATSU, MD       iopamidol  (ISOVUE -M) 41 % intrathecal injection 20 mL  20 mL Other Once PRN Myra Lynwood MATSU, MD       iopamidol  (ISOVUE -M) 41 % intrathecal injection 20 mL  20 mL Other Once PRN Myra Lynwood MATSU, MD       lactated ringers  infusion 1,000 mL  1,000 mL Intravenous Continuous Myra Lynwood MATSU, MD       sodium chloride  flush (NS) 0.9 % injection 10 mL  10 mL Other Once Myra Lynwood MATSU, MD       sodium chloride  flush (NS) 0.9 % injection 10 mL  10 mL Other Once Myra Lynwood MATSU, MD       Facility-Administered Medications Ordered in Other Visits  Medication Dose Route Frequency Provider Last Rate Last Admin   sodium chloride  flush (NS) 0.9 % injection 3 mL  3 mL Intravenous Q12H Fernand Denyse LABOR, MD        Allergies:   Hydralazine  hcl    Social History:   reports that she quit smoking about 24 years ago. Her smoking use included cigarettes. She started smoking about 44 years ago. She has a 10 pack-year smoking history. She has never used smokeless tobacco. She reports that she does not drink alcohol and does not  use drugs.   Family History:  family history includes Diabetes in her mother; Heart attack in her father; Heart failure in her mother; Hyperlipidemia in her mother.    ROS:     Review of Systems  Constitutional: Negative.   HENT: Negative.    Eyes: Negative.   Respiratory: Negative.    Gastrointestinal: Negative.   Genitourinary: Negative.   Musculoskeletal: Negative.   Skin: Negative.   Neurological: Negative.   Endo/Heme/Allergies: Negative.  Psychiatric/Behavioral: Negative.    All other systems reviewed and are negative.     All other systems are reviewed and negative.    PHYSICAL EXAM: VS:  BP (!) 92/58   Pulse 77   Ht 5' 2 (1.575 m)   Wt 192 lb (87.1 kg)   SpO2 99%   BMI 35.12 kg/m  , BMI Body mass index is 35.12 kg/m. Last weight:  Wt Readings from Last 3 Encounters:  09/30/24 192 lb (87.1 kg)  09/27/24 191 lb 11.2 oz (87 kg)  09/02/24 193 lb (87.5 kg)     Physical Exam Constitutional:      Appearance: Normal appearance.  Cardiovascular:     Rate and Rhythm: Normal rate and regular rhythm.     Heart sounds: Normal heart sounds.  Pulmonary:     Effort: Pulmonary effort is normal.     Breath sounds: Normal breath sounds.  Musculoskeletal:     Right lower leg: No edema.     Left lower leg: No edema.  Neurological:     Mental Status: She is alert.       EKG:   Recent Labs: 06/02/2024: TSH 5.800 09/02/2024: ALT 26; BUN 17; Creatinine, Ser 1.11; Hemoglobin 13.4; Platelets 331; Potassium 4.5; Sodium 143    Lipid Panel    Component Value Date/Time   CHOL 165 06/02/2024 1122   CHOL 222 (H) 03/31/2013 0444   TRIG 210 (H) 06/02/2024 1122   TRIG 198 03/31/2013 0444   HDL 44 06/02/2024 1122   HDL 45 03/31/2013 0444   CHOLHDL 3.8 06/02/2024 1122   CHOLHDL 4.1 08/07/2009 0425   VLDL 40 03/31/2013 0444   LDLCALC 86 06/02/2024 1122   LDLCALC 137 (H) 03/31/2013 0444      Other studies Reviewed: Additional studies/ records that were reviewed  today include:  Review of the above records demonstrates:       No data to display            ASSESSMENT AND PLAN:    ICD-10-CM   1. 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 Other chronic pain  G89.29 Drug Screen 13 with reflex Confirmation (AMP,BAR,BZO,COC,PCP,THC,OPI,OXY,MD,FEN,MEP,PPX,TRAM), Serum    3. Other chest pain  R07.89     4. Class 1 obesity due to excess calories with serious comorbidity and body mass index (BMI) of 34.0 to 34.9 in adult  E66.811    E66.09    Z68.34     5. Primary hypertension  I10     6. SOB (shortness of breath)  R06.02     7. Nonrheumatic mitral valve regurgitation  I34.0     8. Coronary artery disease of native artery of native heart with stable angina pectoris  I25.118    Calcified mid LAD 90%,eccentric, needs Atherctomt by Dr Katina at [facility]       Problem List Items Addressed This Visit       Cardiovascular and Mediastinum   CAD (coronary artery disease) - Primary   Nonrheumatic mitral valve regurgitation   Primary hypertension     Other   Chest pain   Other chronic pain   SOB (shortness of breath)   Class 1 obesity due to excess calories with serious comorbidity and body mass index (BMI) of 34.0 to 34.9 in adult       Disposition:   Return in about 3 weeks (around 10/21/2024).    Total time spent:  35 minutes  Signed,  Denyse Bathe, MD  09/30/2024 9:28 AM    [facility] "

## 2024-10-03 ENCOUNTER — Ambulatory Visit: Admitting: Internal Medicine

## 2024-10-04 ENCOUNTER — Encounter: Payer: Self-pay | Admitting: Cardiovascular Disease

## 2024-10-04 ENCOUNTER — Ambulatory Visit: Admitting: Cardiovascular Disease

## 2024-10-04 VITALS — BP 112/62 | HR 76 | Ht 63.0 in | Wt 191.7 lb

## 2024-10-04 DIAGNOSIS — I340 Nonrheumatic mitral (valve) insufficiency: Secondary | ICD-10-CM

## 2024-10-04 DIAGNOSIS — R0602 Shortness of breath: Secondary | ICD-10-CM

## 2024-10-04 DIAGNOSIS — R0789 Other chest pain: Secondary | ICD-10-CM

## 2024-10-04 DIAGNOSIS — I25118 Atherosclerotic heart disease of native coronary artery with other forms of angina pectoris: Secondary | ICD-10-CM | POA: Diagnosis not present

## 2024-10-04 DIAGNOSIS — I10 Essential (primary) hypertension: Secondary | ICD-10-CM | POA: Diagnosis not present

## 2024-10-04 DIAGNOSIS — I25112 Atherosclerotic heart disease of native coronary artery with refractory angina pectoris: Secondary | ICD-10-CM | POA: Diagnosis not present

## 2024-10-04 MED ORDER — ISOSORBIDE MONONITRATE ER 30 MG PO TB24: 30.0000 mg | ORAL_TABLET | Freq: Every day | ORAL | 1 refills | Status: DC

## 2024-10-04 NOTE — H&P (View-Only) (Signed)
 "     Cardiology Office Note   Date:  10/04/2024   ID:  Christina Zamora, DOB 1961/01/06, MRN [id number]  PCP:  [facility]  Cardiologist:  Denyse Bathe, MD      History of Present Illness: Christina Zamora is a 64 y.o. female who presents for  Chief Complaint  Patient presents with   Follow-up    Discuss cath    Has chest pains, spoke to DR cavender as [facility] last minute cancelled case today as out of network.      Past Medical History:  Diagnosis Date   Arthritis    CAD (coronary artery disease)    Chronic headaches    GERD (gastroesophageal reflux disease)    Hyperlipidemia    Hypertension    Insomnia    Lumbago    Pulmonary HTN (HCC)    Thyroid disease    Vitamin D deficiency      Past Surgical History:  Procedure Laterality Date   BILATERAL CARPAL TUNNEL RELEASE  2000   BREAST EXCISIONAL BIOPSY Right 06/24/2010   surgical bx fat necrosis   CORONARY ANGIOGRAPHY N/A 05/29/2017   Procedure: CORONARY ANGIOGRAPHY;  Surgeon: Bathe Denyse DELENA, MD;  Location: [facility] INVASIVE CV LAB;  Service: Cardiovascular;  Laterality: N/A;   CORONARY ANGIOPLASTY WITH STENT PLACEMENT     LEFT HEART CATH Left 05/29/2017   Procedure: Left Heart Cath;  Surgeon: Bathe Denyse DELENA, MD;  Location: [facility] INVASIVE CV LAB;  Service: Cardiovascular;  Laterality: Left;   LEFT HEART CATH AND CORONARY ANGIOGRAPHY Left 01/27/2020   Procedure: LEFT HEART CATH AND CORONARY ANGIOGRAPHY;  Surgeon: Bathe Denyse DELENA, MD;  Location: [facility] INVASIVE CV LAB;  Service: Cardiovascular;  Laterality: Left;   LEFT HEART CATH AND CORONARY ANGIOGRAPHY Right 09/27/2024   Procedure: LEFT HEART CATH AND CORONARY ANGIOGRAPHY with possible coronary intervention;  Surgeon: Bathe Denyse DELENA, MD;  Location: [facility] INVASIVE CV LAB;  Service: Cardiovascular;  Laterality: Right;   REPLACEMENT TOTAL KNEE Left 2015     Current Outpatient Medications  Medication Sig Dispense Refill   isosorbide  mononitrate (IMDUR ) 30 MG 24  hr tablet Take 1 tablet (30 mg total) by mouth daily. 30 tablet 1   acetaminophen  (TYLENOL ) 500 MG tablet Take 2 tablets (1,000 mg total) by mouth every 6 (six) hours as needed. 100 tablet 2   amLODipine  (NORVASC ) 10 MG tablet Take 1 tablet (10 mg total) by mouth daily. (Patient taking differently: Take 10 mg by mouth at bedtime.) 90 tablet 1   atorvastatin  (LIPITOR) 80 MG tablet TAKE 1 TABLET BY MOUTH EVERYDAY AT BEDTIME 90 tablet 0   buPROPion  (WELLBUTRIN  SR) 150 MG 12 hr tablet TAKE 1 TABLET BY MOUTH TWICE A DAY 180 tablet 1   butalbital-acetaminophen -caffeine (FIORICET) 50-325-40 MG tablet Take 1 tablet by mouth 2 (two) times daily as needed for headache.     Calcium -Magnesium-Zinc (CAL-MAG-ZINC PO) Take 1 tablet by mouth daily.     clopidogrel  (PLAVIX ) 75 MG tablet TAKE 1 TABLET BY MOUTH EVERY DAY **MAKE FOLLOW UP APPT WITH DR. Naiomy Watters FOR REFILLS** 90 tablet 2   cyclobenzaprine (FLEXERIL) 10 MG tablet TAKE 1 TABLET BY MOUTH AT BEDTIME FOR MUSCLE RELAXANT **WILL MAKE DROWSY 30 tablet 3   levothyroxine  (SYNTHROID ) 200 MCG tablet Take 1 tablet (200 mcg total) by mouth daily before breakfast. (Patient taking differently: Take 200 mcg by mouth See admin instructions. Take with 50 mcg for a total of 250 mcg with in the morning) 90  tablet 0   levothyroxine  (SYNTHROID ) 50 MCG tablet Take 1 tablet (50 mcg total) by mouth daily. (Patient taking differently: Take 50 mcg by mouth See admin instructions. Take with 200 mcg for a total of 250 mcg in the morning) 30 tablet 11   metoprolol succinate (TOPROL-XL) 25 MG 24 hr tablet TAKE 1 TABLET BY MOUTH EVERY DAY **MAKE FOLLOW UP APPT WITH DR. Rajat Staver FOR REFILLS** (Patient taking differently: 25 mg at bedtime.) 90 tablet 4   Multiple Vitamins-Minerals (MULTIVITAMIN WITH MINERALS) tablet Take 1 tablet by mouth daily.     nitroGLYCERIN  (NITROSTAT ) 0.4 MG SL tablet Place 1 tablet (0.4 mg total) under the tongue every 5 (five) minutes as needed for chest pain. 10 tablet 0    pantoprazole  (PROTONIX ) 40 MG tablet Take 1 tablet (40 mg total) by mouth 2 (two) times daily. (Patient taking differently: Take 40 mg by mouth 2 (two) times daily as needed (Heartburn).) 180 tablet 1   ramipril  (ALTACE ) 10 MG capsule TAKE 1 CAPSULE BY MOUTH EVERY DAY IN THE MORNING 90 capsule 1   traMADol  (ULTRAM ) 50 MG tablet Take 1 tablet (50 mg total) by mouth 3 (three) times daily as needed for up to 7 days. (Patient taking differently: Take 50 mg by mouth 3 (three) times daily.) 21 tablet 0   Current Facility-Administered Medications  Medication Dose Route Frequency Provider Last Rate Last Admin   iohexol  (OMNIPAQUE ) 180 MG/ML injection 20 mL  20 mL Other Once PRN Adams, James G, MD   4 mL at 12/05/15 1545   iopamidol  (ISOVUE -M) 41 % intrathecal injection 20 mL  20 mL Other Once PRN Myra Lynwood MATSU, MD       iopamidol  (ISOVUE -M) 41 % intrathecal injection 20 mL  20 mL Other Once PRN Myra Lynwood MATSU, MD       iopamidol  (ISOVUE -M) 41 % intrathecal injection 20 mL  20 mL Other Once PRN Myra Lynwood MATSU, MD       lactated ringers  infusion 1,000 mL  1,000 mL Intravenous Continuous Myra Lynwood MATSU, MD       sodium chloride  flush (NS) 0.9 % injection 10 mL  10 mL Other Once Myra Lynwood MATSU, MD       sodium chloride  flush (NS) 0.9 % injection 10 mL  10 mL Other Once Myra Lynwood MATSU, MD       Facility-Administered Medications Ordered in Other Visits  Medication Dose Route Frequency Provider Last Rate Last Admin   sodium chloride  flush (NS) 0.9 % injection 3 mL  3 mL Intravenous Q12H Fernand Denyse LABOR, MD        Allergies:   Hydralazine  hcl    Social History:   reports that she quit smoking about 24 years ago. Her smoking use included cigarettes. She started smoking about 44 years ago. She has a 10 pack-year smoking history. She has never used smokeless tobacco. She reports that she does not drink alcohol and does not use drugs.   Family History:  family history includes Diabetes in her mother; Heart  attack in her father; Heart failure in her mother; Hyperlipidemia in her mother.    ROS:     Review of Systems  Constitutional: Negative.   HENT: Negative.    Eyes: Negative.   Respiratory: Negative.    Gastrointestinal: Negative.   Genitourinary: Negative.   Musculoskeletal: Negative.   Skin: Negative.   Neurological: Negative.   Endo/Heme/Allergies: Negative.   Psychiatric/Behavioral: Negative.    All other systems reviewed  and are negative.     All other systems are reviewed and negative.    PHYSICAL EXAM: VS:  BP 112/62   Pulse 76   Ht 5' 3 (1.6 m)   Wt 191 lb 11.2 oz (87 kg)   SpO2 97%   BMI 33.96 kg/m  , BMI Body mass index is 33.96 kg/m. Last weight:  Wt Readings from Last 3 Encounters:  10/04/24 191 lb 11.2 oz (87 kg)  09/30/24 192 lb (87.1 kg)  09/27/24 191 lb 11.2 oz (87 kg)     Physical Exam Constitutional:      Appearance: Normal appearance.  Cardiovascular:     Rate and Rhythm: Normal rate and regular rhythm.     Heart sounds: Normal heart sounds.  Pulmonary:     Effort: Pulmonary effort is normal.     Breath sounds: Normal breath sounds.  Musculoskeletal:     Right lower leg: No edema.     Left lower leg: No edema.  Neurological:     Mental Status: She is alert.       EKG:   Recent Labs: 06/02/2024: TSH 5.800 09/02/2024: ALT 26; BUN 17; Creatinine, Ser 1.11; Hemoglobin 13.4; Platelets 331; Potassium 4.5; Sodium 143    Lipid Panel    Component Value Date/Time   CHOL 165 06/02/2024 1122   CHOL 222 (H) 03/31/2013 0444   TRIG 210 (H) 06/02/2024 1122   TRIG 198 03/31/2013 0444   HDL 44 06/02/2024 1122   HDL 45 03/31/2013 0444   CHOLHDL 3.8 06/02/2024 1122   CHOLHDL 4.1 08/07/2009 0425   VLDL 40 03/31/2013 0444   LDLCALC 86 06/02/2024 1122   LDLCALC 137 (H) 03/31/2013 0444      Other studies Reviewed: Additional studies/ records that were reviewed today include:  Review of the above records demonstrates:       No data to  display            ASSESSMENT AND PLAN:    ICD-10-CM   1. 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 Other chest pain  R07.89 isosorbide  mononitrate (IMDUR ) 30 MG 24 hr tablet   Has chest pains. and spoke to DR cavender at [facility] who will set it up right away. Add imdur  30    3. Primary hypertension  I10 isosorbide  mononitrate (IMDUR ) 30 MG 24 hr tablet    4. SOB (shortness of breath)  R06.02 isosorbide  mononitrate (IMDUR ) 30 MG 24 hr tablet    5. Nonrheumatic mitral valve regurgitation  I34.0 isosorbide  mononitrate (IMDUR ) 30 MG 24 hr tablet    6. Coronary artery disease of native artery of native heart with stable angina pectoris  I25.118 isosorbide  mononitrate (IMDUR ) 30 MG 24 hr tablet       Problem List Items Addressed This Visit       Cardiovascular and Mediastinum   CAD (coronary artery disease) - Primary   Relevant Medications   isosorbide  mononitrate (IMDUR ) 30 MG 24 hr tablet   Nonrheumatic mitral valve regurgitation   Relevant Medications   isosorbide  mononitrate (IMDUR ) 30 MG 24 hr tablet   Primary hypertension   Relevant Medications   isosorbide  mononitrate (IMDUR ) 30 MG 24 hr tablet     Other   Chest pain   Relevant Medications   isosorbide  mononitrate (IMDUR ) 30 MG 24 hr tablet   SOB (shortness of breath)  Relevant Medications   isosorbide  mononitrate (IMDUR ) 30 MG 24 hr tablet       Disposition:   No follow-ups on file.    Total time spent: 35 minutes  Signed,  Denyse Bathe, MD  10/04/2024 2:27 PM    [facility] "

## 2024-10-04 NOTE — Progress Notes (Signed)
 "     Cardiology Office Note   Date:  10/04/2024   ID:  [name], DOB 1961/01/06, MRN [id number]  [facility]P:  [facility]  Cardiologist:  Denyse Bathe, MD      History of Present Illness: Christina Zamora is a 64 y.o. female who presents for  Chief Complaint  Patient presents with   Follow-up    Discuss cath    Has chest pains, spoke to DR cavender as [facility] last minute cancelled case today as out of network.      Past Medical History:  Diagnosis Date   Arthritis    CAD (coronary artery disease)    Chronic headaches    GERD (gastroesophageal reflux disease)    Hyperlipidemia    Hypertension    Insomnia    Lumbago    Pulmonary HTN (HCC)    Thyroid disease    Vitamin D deficiency      Past Surgical History:  Procedure Laterality Date   BILATERAL CARPAL TUNNEL RELEASE  2000   BREAST EXCISIONAL BIOPSY Right 06/24/2010   surgical bx fat necrosis   CORONARY ANGIOGRAPHY N/A 05/29/2017   Procedure: CORONARY ANGIOGRAPHY;  Surgeon: Bathe Denyse DELENA, MD;  Location: [facility] INVASIVE CV LAB;  Service: Cardiovascular;  Laterality: N/A;   CORONARY ANGIOPLASTY WITH STENT PLACEMENT     LEFT HEART CATH Left 05/29/2017   Procedure: Left Heart Cath;  Surgeon: Bathe Denyse DELENA, MD;  Location: [facility] INVAS[facility] CV LAB;  Service: Cardiovascular;  Laterality: Left;   LEFT HEART CATH AND CORONARY ANGIOGRAPHY Left 01/27/2020   Procedure: LEFT HEART CATH AND CORONARY ANGIOGRAPHY;  Surgeon: Bathe Denyse DELENA, MD;  Location: [facility] INVASIVE CV LAB;  Service: Cardiovascular;  Laterality: Left;   LEFT HEART CATH AND CORONARY ANGIOGRAPHY Right 09/27/2024   Procedure: LEFT HEART CATH AND CORONARY ANGIOGRAPHY with possible coronary intervention;  Surgeon: Bathe Denyse DELENA, MD;  Location: [facility] INVASIVE CV LAB;  Service: Cardiovascular;  Laterality: Right;   REPLACEMENT TOTAL KNEE Left 2015     Current Outpatient Medications  Medication Sig Dispense Refill   isosorbide  mononitrate (IMDUR ) 30 MG 24  hr tablet Take 1 tablet (30 mg total) by mouth daily. 30 tablet 1   acetaminophen  (TYLENOL ) 500 MG tablet Take 2 tablets (1,000 mg total) by mouth every 6 (six) hours as needed. 100 tablet 2   amLODipine  (NORVASC ) 10 MG tablet Take 1 tablet (10 mg total) by mouth daily. (Patient taking differently: Take 10 mg by mouth at bedtime.) 90 tablet 1   atorvastatin  (LIPITOR) 80 MG tablet TAKE 1 TABLET BY MOUTH EVERYDAY AT BEDTIME 90 tablet 0   buPROPion  (WELLBUTRIN  SR) 150 MG 12 hr tablet TAKE 1 TABLET BY MOUTH TWICE A DAY 180 tablet 1   butalbital-acetaminophen -caffeine (FIORICET) 50-325-40 MG tablet Take 1 tablet by mouth 2 (two) times daily as needed for headache.     Calcium -Magnesium-Zinc (CAL-MAG-ZINC PO) Take 1 tablet by mouth daily.     clopidogrel  (PLAVIX ) 75 MG tablet TAKE 1 TABLET BY MOUTH EVERY DAY **MAKE FOLLOW UP APPT WITH DR. Naiomy Watters FOR REFILLS** 90 tablet 2   cyclobenzaprine (FLEXERIL) 10 MG tablet TAKE 1 TABLET BY MOUTH AT BEDTIME FOR MUSCLE RELAXANT **WILL MAKE DROWSY 30 tablet 3   levothyroxine  (SYNTHROID ) 200 MCG tablet Take 1 tablet (200 mcg total) by mouth daily before breakfast. (Patient taking differently: Take 200 mcg by mouth See admin instructions. Take with 50 mcg for a total of 250 mcg with in the morning) 90  tablet 0   levothyroxine  (SYNTHROID ) 50 MCG tablet Take 1 tablet (50 mcg total) by mouth daily. (Patient taking differently: Take 50 mcg by mouth See admin instructions. Take with 200 mcg for a total of 250 mcg in the morning) 30 tablet 11   metoprolol succinate (TOPROL-XL) 25 MG 24 hr tablet TAKE 1 TABLET BY MOUTH EVERY DAY **MAKE FOLLOW UP APPT WITH DR. Rajat Staver FOR REFILLS** (Patient taking differently: 25 mg at bedtime.) 90 tablet 4   Multiple Vitamins-Minerals (MULTIVITAMIN WITH MINERALS) tablet Take 1 tablet by mouth daily.     nitroGLYCERIN  (NITROSTAT ) 0.4 MG SL tablet Place 1 tablet (0.4 mg total) under the tongue every 5 (five) minutes as needed for chest pain. 10 tablet 0    pantoprazole  (PROTONIX ) 40 MG tablet Take 1 tablet (40 mg total) by mouth 2 (two) times daily. (Patient taking differently: Take 40 mg by mouth 2 (two) times daily as needed (Heartburn).) 180 tablet 1   ramipril  (ALTACE ) 10 MG capsule TAKE 1 CAPSULE BY MOUTH EVERY DAY IN THE MORNING 90 capsule 1   traMADol  (ULTRAM ) 50 MG tablet Take 1 tablet (50 mg total) by mouth 3 (three) times daily as needed for up to 7 days. (Patient taking differently: Take 50 mg by mouth 3 (three) times daily.) 21 tablet 0   Current Facility-Administered Medications  Medication Dose Route Frequency Provider Last Rate Last Admin   iohexol  (OMNIPAQUE ) 180 MG/ML injection 20 mL  20 mL Other Once PRN Adams, James G, MD   4 mL at 12/05/15 1545   iopamidol  (ISOVUE -M) 41 % intrathecal injection 20 mL  20 mL Other Once PRN Myra Lynwood MATSU, MD       iopamidol  (ISOVUE -M) 41 % intrathecal injection 20 mL  20 mL Other Once PRN Myra Lynwood MATSU, MD       iopamidol  (ISOVUE -M) 41 % intrathecal injection 20 mL  20 mL Other Once PRN Myra Lynwood MATSU, MD       lactated ringers  infusion 1,000 mL  1,000 mL Intravenous Continuous Myra Lynwood MATSU, MD       sodium chloride  flush (NS) 0.9 % injection 10 mL  10 mL Other Once Myra Lynwood MATSU, MD       sodium chloride  flush (NS) 0.9 % injection 10 mL  10 mL Other Once Myra Lynwood MATSU, MD       Facility-Administered Medications Ordered in Other Visits  Medication Dose Route Frequency Provider Last Rate Last Admin   sodium chloride  flush (NS) 0.9 % injection 3 mL  3 mL Intravenous Q12H Fernand Denyse LABOR, MD        Allergies:   Hydralazine  hcl    Social History:   reports that she quit smoking about 24 years ago. Her smoking use included cigarettes. She started smoking about 44 years ago. She has a 10 pack-year smoking history. She has never used smokeless tobacco. She reports that she does not drink alcohol and does not use drugs.   Family History:  family history includes Diabetes in her mother; Heart  attack in her father; Heart failure in her mother; Hyperlipidemia in her mother.    ROS:     Review of Systems  Constitutional: Negative.   HENT: Negative.    Eyes: Negative.   Respiratory: Negative.    Gastrointestinal: Negative.   Genitourinary: Negative.   Musculoskeletal: Negative.   Skin: Negative.   Neurological: Negative.   Endo/Heme/Allergies: Negative.   Psychiatric/Behavioral: Negative.    All other systems reviewed  and are negative.     All other systems are reviewed and negative.    PHYSICAL EXAM: VS:  BP 112/62   Pulse 76   Ht 5' 3 (1.6 m)   Wt 191 lb 11.2 oz (87 kg)   SpO2 97%   BMI 33.96 kg/m  , BMI Body mass index is 33.96 kg/m. Last weight:  Wt Readings from Last 3 Encounters:  10/04/24 191 lb 11.2 oz (87 kg)  09/30/24 192 lb (87.1 kg)  09/27/24 191 lb 11.2 oz (87 kg)     Physical Exam Constitutional:      Appearance: Normal appearance.  Cardiovascular:     Rate and Rhythm: Normal rate and regular rhythm.     Heart sounds: Normal heart sounds.  Pulmonary:     Effort: Pulmonary effort is normal.     Breath sounds: Normal breath sounds.  Musculoskeletal:     Right lower leg: No edema.     Left lower leg: No edema.  Neurological:     Mental Status: She is alert.       EKG:   Recent Labs: 06/02/2024: TSH 5.800 09/02/2024: ALT 26; BUN 17; Creatinine, Ser 1.11; Hemoglobin 13.4; Platelets 331; Potassium 4.5; Sodium 143    Lipid Panel    Component Value Date/Time   CHOL 165 06/02/2024 1122   CHOL 222 (H) 03/31/2013 0444   TRIG 210 (H) 06/02/2024 1122   TRIG 198 03/31/2013 0444   HDL 44 06/02/2024 1122   HDL 45 03/31/2013 0444   CHOLHDL 3.8 06/02/2024 1122   CHOLHDL 4.1 08/07/2009 0425   VLDL 40 03/31/2013 0444   LDLCALC 86 06/02/2024 1122   LDLCALC 137 (H) 03/31/2013 0444      Other studies Reviewed: Additional studies/ records that were reviewed today include:  Review of the above records demonstrates:       No data to  display            ASSESSMENT AND PLAN:    ICD-10-CM   1. 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 Other chest pain  R07.89 isosorbide  mononitrate (IMDUR ) 30 MG 24 hr tablet   Has chest pains. and spoke to DR cavender at [facility] who will set it up right away. Add imdur  30    3. Primary hypertension  I10 isosorbide  mononitrate (IMDUR ) 30 MG 24 hr tablet    4. SOB (shortness of breath)  R06.02 isosorbide  mononitrate (IMDUR ) 30 MG 24 hr tablet    5. Nonrheumatic mitral valve regurgitation  I34.0 isosorbide  mononitrate (IMDUR ) 30 MG 24 hr tablet    6. Coronary artery disease of native artery of native heart with stable angina pectoris  I25.118 isosorbide  mononitrate (IMDUR ) 30 MG 24 hr tablet       Problem List Items Addressed This Visit       Cardiovascular and Mediastinum   CAD (coronary artery disease) - Primary   Relevant Medications   isosorbide  mononitrate (IMDUR ) 30 MG 24 hr tablet   Nonrheumatic mitral valve regurgitation   Relevant Medications   isosorbide  mononitrate (IMDUR ) 30 MG 24 hr tablet   Primary hypertension   Relevant Medications   isosorbide  mononitrate (IMDUR ) 30 MG 24 hr tablet     Other   Chest pain   Relevant Medications   isosorbide  mononitrate (IMDUR ) 30 MG 24 hr tablet   SOB (shortness of breath)  Relevant Medications   isosorbide  mononitrate (IMDUR ) 30 MG 24 hr tablet       Disposition:   No follow-ups on file.    Total time spent: 35 minutes  Signed,  Denyse Bathe, MD  10/04/2024 2:27 PM    [facility] "

## 2024-10-07 ENCOUNTER — Telehealth: Payer: Self-pay | Admitting: General Practice

## 2024-10-07 ENCOUNTER — Telehealth: Payer: Self-pay

## 2024-10-07 NOTE — Telephone Encounter (Signed)
 PT called in again (2x) wanting to know an update of the cath she was supposed to have scheduled with a [facility] doc?  Please advise

## 2024-10-07 NOTE — Telephone Encounter (Signed)
 Patient NO SHOWED her PM with you for the initial appt, she mentioned to me at one of her visits with [facility] she was going to talk with you about not needing to be in the clinic, what do you want to do since she NO SHOWED

## 2024-10-07 NOTE — Telephone Encounter (Signed)
 PT called in regards to an update on the procedure [facility] wanted her to get done. Stated that he told her he was calling a [facility] doc to get this procedure scheduled Can someone update her or give her the number to get this scheduled?

## 2024-10-08 LAB — DRUG SCREEN 13 W/CONF , SERUM
Amphetamines, IA: NEGATIVE ng/mL
Barbiturates, IA: NEGATIVE ug/mL
Benzodiazepines, IA: NEGATIVE ng/mL
Cocaine & Metabolite, IA: NEGATIVE ng/mL
FENTANYL, IA: NEGATIVE ng/mL
MEPERIDINE, IA: NEGATIVE ng/mL
Methadone, IA: NEGATIVE ng/mL
Opiates, IA: NEGATIVE ng/mL
Oxycodones, IA: NEGATIVE ng/mL
Phencyclidine, IA: NEGATIVE ng/mL
Propoxyphene, IA: NEGATIVE ng/mL
THC(Marijuana) Metabolite, IA: NEGATIVE ng/mL
TRAMADOL, IA: POSITIVE ng/mL — AB

## 2024-10-08 LAB — TRAMADOL,MS,WB/SP RFX
O-Desmethyltramadol: 14 ng/mL
Tramadol Confirmation: POSITIVE
Tramadol: 340.2 ng/mL

## 2024-10-10 ENCOUNTER — Telehealth: Payer: Self-pay | Admitting: General Practice

## 2024-10-10 ENCOUNTER — Telehealth: Payer: Self-pay | Admitting: *Deleted

## 2024-10-10 DIAGNOSIS — Z01812 Encounter for preprocedural laboratory examination: Secondary | ICD-10-CM

## 2024-10-10 DIAGNOSIS — I25118 Atherosclerotic heart disease of native coronary artery with other forms of angina pectoris: Secondary | ICD-10-CM

## 2024-10-10 NOTE — Telephone Encounter (Signed)
 Pt requesting a c/b from the nurse to discuss cath procedure

## 2024-10-10 NOTE — Telephone Encounter (Signed)
 Spoke with the patient and [facility] is not in network with the patients insurance either and will not be doing the cath, she said [facility] and [facility] are in network, sk informed and will be getting something scheduled and he will let me know

## 2024-10-10 NOTE — Telephone Encounter (Signed)
 Attempted to call the patient about the procedure on 10/13/24 with Dr. Darron. Was unable to leave a message as the voicemail was full.  Instructions have also been sent to her MyChart.    You are scheduled for a Cardiac Catheterization on Thursday, January 22 with Dr. Deatrice Darron.  1. Please arrive at the [facility] (Main Entrance A) at [facility]: [address] [place], [place] [address] at 10:00 AM (This time is 2 hour(s) before your procedure to ensure your preparation).   Free valet parking service is available. You will check in at ADMITTING. The support person will be asked to wait in the waiting room.  It is OK to have someone drop you off and come back when you are ready to be discharged.    Special note: Every effort is made to have your procedure done on time. Please understand that emergencies sometimes delay scheduled procedures.  2. Diet: Light meals may be eaten up to 6 hours before scheduled procedures from 12N and after; please stop eating at 6:00 AM   Light meal consist of plain toast, fruit, light soups, crackers.   3. Hydration: You need to be well hydrated before your procedure. On January 22, you may drink approved liquids (see below) until 2 hours before the procedure, with 16 oz of water  as your last intake.   List of approved liquids water , clear juice, clear tea, black coffee, fruit juices, non-citric and without pulp, carbonated beverages, Gatorade, Kool -Aid, plain Jello-O and plain ice popsicles.  4. Labs: You will need to have blood drawn on 10/11/24.  You do not need to be fasting. Please go to the [facility] entrance and check in at the front desk.  You do not need an appointment.  They are open from 7am-6 pm.    5. Medication instructions in preparation for your procedure: Nothing to hold  On the morning of your procedure, take your Plavix /Clopidogrel  and any morning medicines NOT listed above.  You may use sips of water .  6. Plan  to go home the same day, you will only stay overnight if medically necessary. 7. Bring a current list of your medications and current insurance cards. 8. You MUST have a responsible person to drive you home. 9. Someone MUST be with you the first 24 hours after you arrive home or your discharge will be delayed. 10. Please wear clothes that are easy to get on and off and wear slip-on shoes.  Thank you for allowing us  to care for you!   -- [facility]

## 2024-10-10 NOTE — Telephone Encounter (Signed)
 Pt called wanting to know information on heart cath

## 2024-10-11 ENCOUNTER — Other Ambulatory Visit
Admission: RE | Admit: 2024-10-11 | Discharge: 2024-10-11 | Disposition: A | Discharge status: Home / Self Care | Attending: Cardiovascular Disease | Admitting: Cardiovascular Disease

## 2024-10-11 DIAGNOSIS — I25118 Atherosclerotic heart disease of native coronary artery with other forms of angina pectoris: Secondary | ICD-10-CM | POA: Diagnosis present

## 2024-10-11 DIAGNOSIS — Z01812 Encounter for preprocedural laboratory examination: Secondary | ICD-10-CM | POA: Diagnosis not present

## 2024-10-11 LAB — BASIC METABOLIC PANEL WITH GFR
Anion gap: 10 (ref 5–15)
BUN: 16 mg/dL (ref 8–23)
CO2: 25 mmol/L (ref 22–32)
Calcium: 9.9 mg/dL (ref 8.9–10.3)
Chloride: 106 mmol/L (ref 98–111)
Creatinine, Ser: 1.07 mg/dL — ABNORMAL HIGH (ref 0.44–1.00)
GFR, Estimated: 58 mL/min — ABNORMAL LOW
Glucose, Bld: 82 mg/dL (ref 70–99)
Potassium: 3.9 mmol/L (ref 3.5–5.1)
Sodium: 141 mmol/L (ref 135–145)

## 2024-10-11 LAB — CBC
HCT: 40.1 % (ref 36.0–46.0)
Hemoglobin: 12.7 g/dL (ref 12.0–15.0)
MCH: 27.3 pg (ref 26.0–34.0)
MCHC: 31.7 g/dL (ref 30.0–36.0)
MCV: 86.2 fL (ref 80.0–100.0)
Platelets: 298 K/uL (ref 150–400)
RBC: 4.65 MIL/uL (ref 3.87–5.11)
RDW: 13.6 % (ref 11.5–15.5)
WBC: 6.9 K/uL (ref 4.0–10.5)
nRBC: 0 % (ref 0.0–0.2)

## 2024-10-12 ENCOUNTER — Telehealth: Payer: Self-pay | Admitting: *Deleted

## 2024-10-12 ENCOUNTER — Ambulatory Visit: Payer: Self-pay | Admitting: Cardiovascular Disease

## 2024-10-12 NOTE — Telephone Encounter (Addendum)
 Coronary Atherectomy scheduled at [facility] for: Thursday October 13, 2024 12 Noon Arrival time [facility] Main Entrance A at: 10 AM  Diet: -May have light meal until 6 AM. (6 hours before procedure time) Approved light meal consists of plain toast, fruit, light soups, crackers.   Hydration: -May drink clear liquids until 2 hours before the procedure.  Approved liquids: Water , clear tea, black coffee, fruit juices-non-citric and without pulp,Gatorade, plain Jello/popsicles.   -Please drink 16 oz of water  2 hours before procedure  Medication instructions: -Usual morning medications can be taken including aspirin  81 mg and Plavix  75 mg.  Plan to go home the same day, you will only stay overnight if medically necessary.  You must have responsible adult to drive you home.  Someone must be with you the first 24 hours after you arrive home.  Reviewed procedure instructions with patient.

## 2024-10-13 ENCOUNTER — Other Ambulatory Visit: Payer: Self-pay | Admitting: Cardiovascular Disease

## 2024-10-13 ENCOUNTER — Other Ambulatory Visit: Payer: Self-pay

## 2024-10-13 ENCOUNTER — Encounter (HOSPITAL_COMMUNITY): Payer: Self-pay | Admitting: Cardiovascular Disease

## 2024-10-13 ENCOUNTER — Encounter (HOSPITAL_COMMUNITY)
Admission: RE | Disposition: A | Discharge status: Home / Self Care | Payer: Self-pay | Source: Home / Self Care | Attending: Cardiovascular Disease

## 2024-10-13 ENCOUNTER — Ambulatory Visit (HOSPITAL_COMMUNITY)
Admission: RE | Admit: 2024-10-13 | Discharge: 2024-10-14 | Disposition: A | Discharge status: Home / Self Care | Attending: Cardiovascular Disease | Admitting: Cardiovascular Disease

## 2024-10-13 DIAGNOSIS — Z79899 Other long term (current) drug therapy: Secondary | ICD-10-CM | POA: Diagnosis not present

## 2024-10-13 DIAGNOSIS — I10 Essential (primary) hypertension: Secondary | ICD-10-CM | POA: Insufficient documentation

## 2024-10-13 DIAGNOSIS — I25112 Atherosclerotic heart disease of native coronary artery with refractory angina pectoris: Secondary | ICD-10-CM | POA: Insufficient documentation

## 2024-10-13 DIAGNOSIS — M5386 Other specified dorsopathies, lumbar region: Secondary | ICD-10-CM

## 2024-10-13 DIAGNOSIS — Z87891 Personal history of nicotine dependence: Secondary | ICD-10-CM | POA: Insufficient documentation

## 2024-10-13 DIAGNOSIS — M5432 Sciatica, left side: Secondary | ICD-10-CM

## 2024-10-13 DIAGNOSIS — I2089 Other forms of angina pectoris: Secondary | ICD-10-CM | POA: Diagnosis present

## 2024-10-13 DIAGNOSIS — I2510 Atherosclerotic heart disease of native coronary artery without angina pectoris: Secondary | ICD-10-CM

## 2024-10-13 DIAGNOSIS — I340 Nonrheumatic mitral (valve) insufficiency: Secondary | ICD-10-CM | POA: Insufficient documentation

## 2024-10-13 DIAGNOSIS — M51369 Other intervertebral disc degeneration, lumbar region without mention of lumbar back pain or lower extremity pain: Secondary | ICD-10-CM

## 2024-10-13 DIAGNOSIS — Z955 Presence of coronary angioplasty implant and graft: Secondary | ICD-10-CM

## 2024-10-13 DIAGNOSIS — I209 Angina pectoris, unspecified: Secondary | ICD-10-CM

## 2024-10-13 HISTORY — PX: CORONARY IMAGING/OCT: CATH118326

## 2024-10-13 HISTORY — PX: CORONARY ATHERECTOMY: CATH118238

## 2024-10-13 HISTORY — PX: CORONARY LITHOTRIPSY: CATH118330

## 2024-10-13 LAB — POCT ACTIVATED CLOTTING TIME
Activated Clotting Time: 358 s
Activated Clotting Time: 271 s
Activated Clotting Time: 245 s

## 2024-10-13 MED ORDER — VERAPAMIL HCL 2.5 MG/ML IV SOLN
INTRAVENOUS | Status: AC
  Filled 2024-10-13: qty 2

## 2024-10-13 MED ORDER — ONDANSETRON HCL 4 MG/2ML IJ SOLN
4.0000 mg | Freq: Four times a day (QID) | INTRAMUSCULAR | Status: DC | PRN
  Administered 2024-10-13: 4 mg via INTRAVENOUS
  Filled 2024-10-13: qty 2

## 2024-10-13 MED ORDER — FREE WATER: 500.0000 mL | Freq: Once | Status: DC

## 2024-10-13 MED ORDER — HEPARIN SODIUM (PORCINE) 1000 UNIT/ML IJ SOLN
INTRAMUSCULAR | Status: AC
  Filled 2024-10-13: qty 10

## 2024-10-13 MED ORDER — PANTOPRAZOLE SODIUM 40 MG PO TBEC
40.0000 mg | DELAYED_RELEASE_TABLET | Freq: Two times a day (BID) | ORAL | Status: DC
  Filled 2024-10-13 (×2): qty 1

## 2024-10-13 MED ORDER — SODIUM CHLORIDE 0.9% FLUSH: 3.0000 mL | Freq: Two times a day (BID) | INTRAVENOUS | Status: DC

## 2024-10-13 MED ORDER — LEVOTHYROXINE SODIUM 50 MCG PO TABS
50.0000 ug | ORAL_TABLET | Freq: Every day | ORAL | Status: DC
  Administered 2024-10-14: 50 ug via ORAL
  Filled 2024-10-13: qty 1

## 2024-10-13 MED ORDER — CLOPIDOGREL BISULFATE 75 MG PO TABS: 75.0000 mg | ORAL_TABLET | ORAL | Status: DC

## 2024-10-13 MED ORDER — CLOPIDOGREL BISULFATE 300 MG PO TABS
ORAL_TABLET | ORAL | Status: AC
  Filled 2024-10-13: qty 1

## 2024-10-13 MED ORDER — CLOPIDOGREL BISULFATE 75 MG PO TABS
75.0000 mg | ORAL_TABLET | Freq: Every day | ORAL | Status: DC
  Administered 2024-10-14: 75 mg via ORAL
  Filled 2024-10-13 (×2): qty 1

## 2024-10-13 MED ORDER — BUPROPION HCL ER (SR) 150 MG PO TB12
150.0000 mg | ORAL_TABLET | Freq: Two times a day (BID) | ORAL | Status: DC
  Administered 2024-10-14: 150 mg via ORAL
  Filled 2024-10-13 (×3): qty 1

## 2024-10-13 MED ORDER — SODIUM CHLORIDE 0.9 % IV SOLN: 250.0000 mL | INTRAVENOUS | Status: DC | PRN

## 2024-10-13 MED ORDER — AMLODIPINE BESYLATE 10 MG PO TABS
10.0000 mg | ORAL_TABLET | Freq: Every day | ORAL | Status: DC
  Administered 2024-10-13 – 2024-10-14 (×2): 10 mg via ORAL
  Filled 2024-10-13 (×3): qty 1

## 2024-10-13 MED ORDER — CYCLOBENZAPRINE HCL 10 MG PO TABS
10.0000 mg | ORAL_TABLET | Freq: Every day | ORAL | Status: DC
  Administered 2024-10-13: 10 mg via ORAL
  Filled 2024-10-13: qty 1

## 2024-10-13 MED ORDER — SODIUM CHLORIDE 0.9 % WEIGHT BASED INFUSION
1.0000 mL/kg/h | INTRAVENOUS | Status: AC
  Administered 2024-10-13: 1 mL/kg/h via INTRAVENOUS

## 2024-10-13 MED ORDER — SODIUM CHLORIDE 0.9% FLUSH: 3.0000 mL | INTRAVENOUS | Status: DC | PRN

## 2024-10-13 MED ORDER — HEPARIN (PORCINE) IN NACL 1000-0.9 UT/500ML-% IV SOLN
INTRAVENOUS | Status: DC | PRN
  Administered 2024-10-13 (×2): 500 mL

## 2024-10-13 MED ORDER — ACETAMINOPHEN 500 MG PO TABS
1000.0000 mg | ORAL_TABLET | Freq: Four times a day (QID) | ORAL | Status: DC | PRN
  Administered 2024-10-13: 1000 mg via ORAL

## 2024-10-13 MED ORDER — ISOSORBIDE MONONITRATE ER 30 MG PO TB24
30.0000 mg | ORAL_TABLET | Freq: Every day | ORAL | Status: DC
  Administered 2024-10-14: 30 mg via ORAL
  Filled 2024-10-13 (×2): qty 1

## 2024-10-13 MED ORDER — NITROGLYCERIN 1 MG/10 ML FOR IR/CATH LAB
INTRA_ARTERIAL | Status: DC | PRN
  Administered 2024-10-13 (×2): 200 ug via INTRACORONARY

## 2024-10-13 MED ORDER — MIDAZOLAM HCL 2 MG/2ML IJ SOLN
INTRAMUSCULAR | Status: AC
  Filled 2024-10-13: qty 2

## 2024-10-13 MED ORDER — ATORVASTATIN CALCIUM 80 MG PO TABS: 80.0000 mg | ORAL_TABLET | Freq: Every day | ORAL | Status: DC

## 2024-10-13 MED ORDER — NITROGLYCERIN 1 MG/10 ML FOR IR/CATH LAB
INTRA_ARTERIAL | Status: AC
  Filled 2024-10-13: qty 10

## 2024-10-13 MED ORDER — LIDOCAINE HCL (PF) 1 % IJ SOLN
INTRAMUSCULAR | Status: DC | PRN
  Administered 2024-10-13: 5 mL via INTRADERMAL

## 2024-10-13 MED ORDER — SODIUM CHLORIDE 0.9% FLUSH
3.0000 mL | Freq: Two times a day (BID) | INTRAVENOUS | Status: DC
  Administered 2024-10-14: 3 mL via INTRAVENOUS

## 2024-10-13 MED ORDER — METOPROLOL SUCCINATE ER 25 MG PO TB24
25.0000 mg | ORAL_TABLET | Freq: Every day | ORAL | Status: DC
  Administered 2024-10-13: 25 mg via ORAL
  Filled 2024-10-13: qty 1

## 2024-10-13 MED ORDER — RAMIPRIL 5 MG PO CAPS
10.0000 mg | ORAL_CAPSULE | Freq: Every day | ORAL | Status: DC
  Filled 2024-10-13: qty 2

## 2024-10-13 MED ORDER — LIDOCAINE HCL (PF) 1 % IJ SOLN
INTRAMUSCULAR | Status: AC
  Filled 2024-10-13: qty 30

## 2024-10-13 MED ORDER — FENTANYL CITRATE (PF) 100 MCG/2ML IJ SOLN
INTRAMUSCULAR | Status: DC | PRN
  Administered 2024-10-13 (×3): 25 ug via INTRAVENOUS

## 2024-10-13 MED ORDER — MIDAZOLAM HCL (PF) 2 MG/2ML IJ SOLN
INTRAMUSCULAR | Status: DC | PRN
  Administered 2024-10-13 (×2): 1 mg via INTRAVENOUS

## 2024-10-13 MED ORDER — SODIUM CHLORIDE 0.9 % IV SOLN
INTRAVENOUS | Status: DC | PRN
  Administered 2024-10-13: 150 mL via INTRAVENOUS

## 2024-10-13 MED ORDER — FENTANYL CITRATE (PF) 100 MCG/2ML IJ SOLN
INTRAMUSCULAR | Status: AC
  Filled 2024-10-13: qty 2

## 2024-10-13 MED ORDER — VERAPAMIL HCL 2.5 MG/ML IV SOLN
INTRAVENOUS | Status: DC | PRN
  Administered 2024-10-13: 10 mL via INTRA_ARTERIAL

## 2024-10-13 MED ORDER — NITROGLYCERIN 0.4 MG SL SUBL: 0.4000 mg | SUBLINGUAL_TABLET | SUBLINGUAL | Status: DC | PRN

## 2024-10-13 MED ORDER — HEPARIN SODIUM (PORCINE) 1000 UNIT/ML IJ SOLN
INTRAMUSCULAR | Status: DC | PRN
  Administered 2024-10-13: 9000 [IU] via INTRAVENOUS
  Administered 2024-10-13: 2000 [IU] via INTRAVENOUS

## 2024-10-13 MED ORDER — VIPERSLIDE LUBRICANT OPTIME
TOPICAL | Status: DC | PRN
  Administered 2024-10-13: 20 mL via SURGICAL_CAVITY

## 2024-10-13 MED ORDER — ASPIRIN 81 MG PO CHEW
81.0000 mg | CHEWABLE_TABLET | ORAL | Status: AC
  Administered 2024-10-13: 81 mg via ORAL
  Filled 2024-10-13: qty 1

## 2024-10-13 MED ORDER — LEVOTHYROXINE SODIUM 100 MCG PO TABS
200.0000 ug | ORAL_TABLET | Freq: Every day | ORAL | Status: DC
  Administered 2024-10-14: 200 ug via ORAL
  Filled 2024-10-13: qty 2

## 2024-10-13 MED ORDER — CLOPIDOGREL BISULFATE 300 MG PO TABS
ORAL_TABLET | ORAL | Status: DC | PRN
  Administered 2024-10-13: 300 mg via ORAL

## 2024-10-13 MED ORDER — ACETAMINOPHEN 500 MG PO TABS
ORAL_TABLET | ORAL | Status: AC
  Filled 2024-10-13: qty 2

## 2024-10-13 MED ORDER — IOHEXOL 350 MG/ML SOLN
INTRAVENOUS | Status: DC | PRN
  Administered 2024-10-13: 150 mL

## 2024-10-13 NOTE — Interval H&P Note (Signed)
 History and Physical Interval Note:  10/13/2024 11:52 AM  Christina Zamora  has presented today for surgery, with the diagnosis of chest pain.  The various methods of treatment have been discussed with the patient and family. After consideration of risks, benefits and other options for treatment, the patient has consented to  Procedures: CORONARY ATHERECTOMY (N/A) as a surgical intervention.  The patient's history has been reviewed, patient examined, no change in status, stable for surgery.  I have reviewed the patient's chart and labs.  Questions were answered to the patient's satisfaction.     [name]

## 2024-10-13 NOTE — Plan of Care (Signed)
" °  Problem: Activity: Goal: Ability to return to baseline activity level will improve Outcome: Progressing   Problem: Cardiovascular: Goal: Vascular access site(s) Level 0-1 will be maintained Outcome: Progressing   Problem: Health Behavior/Discharge Planning: Goal: Ability to safely manage health-related needs after discharge will improve Outcome: Progressing   Problem: Education: Goal: Knowledge of General Education information will improve Description: Including pain rating scale, medication(s)/side effects and non-pharmacologic comfort measures Outcome: Progressing   Problem: Health Behavior/Discharge Planning: Goal: Ability to manage health-related needs will improve Outcome: Progressing   Problem: Clinical Measurements: Goal: Ability to maintain clinical measurements within normal limits will improve Outcome: Progressing Goal: Cardiovascular complication will be avoided Outcome: Progressing   "

## 2024-10-14 DIAGNOSIS — I25112 Atherosclerotic heart disease of native coronary artery with refractory angina pectoris: Secondary | ICD-10-CM | POA: Diagnosis not present

## 2024-10-14 DIAGNOSIS — I2089 Other forms of angina pectoris: Secondary | ICD-10-CM | POA: Diagnosis not present

## 2024-10-14 LAB — BASIC METABOLIC PANEL WITH GFR
Anion gap: 10 (ref 5–15)
BUN: 18 mg/dL (ref 8–23)
CO2: 25 mmol/L (ref 22–32)
Calcium: 9 mg/dL (ref 8.9–10.3)
Chloride: 107 mmol/L (ref 98–111)
Creatinine, Ser: 1.01 mg/dL — ABNORMAL HIGH (ref 0.44–1.00)
GFR, Estimated: >60 mL/min
Glucose, Bld: 87 mg/dL (ref 70–99)
Potassium: 4.3 mmol/L (ref 3.5–5.1)
Sodium: 141 mmol/L (ref 135–145)

## 2024-10-14 LAB — CBC
HCT: 38.8 % (ref 36.0–46.0)
Hemoglobin: 12.5 g/dL (ref 12.0–15.0)
MCH: 27.8 pg (ref 26.0–34.0)
MCHC: 32.2 g/dL (ref 30.0–36.0)
MCV: 86.2 fL (ref 80.0–100.0)
Platelets: 290 K/uL (ref 150–400)
RBC: 4.5 MIL/uL (ref 3.87–5.11)
RDW: 13.8 % (ref 11.5–15.5)
WBC: 6.7 K/uL (ref 4.0–10.5)
nRBC: 0 % (ref 0.0–0.2)

## 2024-10-14 MED ORDER — ASPIRIN 81 MG PO TBEC: 81.0000 mg | DELAYED_RELEASE_TABLET | Freq: Every day | ORAL | 1 refills | Status: AC

## 2024-10-14 MED FILL — Lidocaine HCl Local Preservative Free (PF) Inj 1%: INTRAMUSCULAR | Qty: 30 | Status: AC

## 2024-10-14 NOTE — Discharge Summary (Addendum)
 " Discharge Summary   Patient ID: Christina Zamora MRN: [id number]; DOB: [birth year]  Admit date: 10/13/2024 Discharge date: 10/14/2024  [facility] Providers Cardiologist:  Dr. Fernand  Discharge Diagnoses  Principal Problem:   Stable angina   Diagnostic Studies/Procedures   Cath: 10/13/2024    Mid LAD lesion is 90% stenosed.   Prox LAD lesion is 60% stenosed.   Mid LAD to Dist LAD lesion is 40% stenosed.   A drug-eluting stent was successfully placed using a STENT ONYX FRONTIER [id number].   A drug-eluting stent was successfully placed using a STENT ONYX FRONTIER 3.0X12.   Post intervention, there is a 0% residual stenosis.   Post intervention, there is a 0% residual stenosis.   In the absence of any other complications or medical issues, we expect the patient to be ready for discharge from an interventional cardiology perspective on 10/14/2024.   Recommend dual antiplatelet therapy with Aspirin  81mg  daily and Clopidogrel  75mg  daily.   Successful OCT guided orbital atherectomy and drug-eluting stent placement to the mid LAD.  In addition, intravascular lithotripsy was performed due to significant disease proximally with deep calcifications noted by OCT.  Another overlapping stent was placed proximally.   Recommendations: She can likely be discharged home tomorrow.  Recommend indefinite dual antiplatelet therapy as tolerated. She is to follow-up with her cardiologist Dr. Fernand in 1 to 2 weeks.   _____________   History of Present Illness   Christina Zamora is a 64 y.o. female with past medical history of hypertension, hyperlipidemia, CAD, hypothyroidism who was followed by Dr. Fernand as an outpatient.  Seen in the office 08/2024 and set up for cardiac catheterization in the setting of chest pain responsive to nitro.  Was found to have moderate disease in distal RCA, high-grade eccentric calcified lesion in the mid LAD requiring atherectomy.   She was initially referred to [facility] but this was ultimately canceled as it was out of network.  Patient was then referred to Dr. Darron for consideration of PCI.    Hospital Course    Underwent successful OTC guided orbital atherectomy and drug-eluting stent to mid LAD with intravascular lithotripsy and OTC guidance.  Required overlapping stent placed proximally.  Patient's for DAPT with aspirin /Plavix .  She will be continued on her other home medications without significant change.  Follow-up with Dr. Fernand.  General: Well developed, well nourished, female appearing in no acute distress. Head: Normocephalic, atraumatic.  Neck: Supple without bruits, JVD. Lungs:  Resp regular and unlabored, CTA. Heart: RRR, S1, S2, no S3, S4, or murmur; no rub. Abdomen: Soft, non-tender, non-distended with normoactive bowel sounds. Extremities: No clubbing, cyanosis, edema. Distal pedal pulses are 2+ bilaterally. Right radial cath site stable without bruising or hematoma Neuro: Alert and oriented X 3. Moves all extremities spontaneously. Psych: Normal affect.  Patient was seen by myself and Dr. Wendel and deemed stable for discharge home. She has follow up arranged with Dr. Fernand next week.   Did the patient have an acute coronary syndrome (MI, NSTEMI, STEMI, etc) this admission?:  No                               Did the patient have a percutaneous coronary intervention (stent / angioplasty)?:  Yes.     Cath/PCI Registry Performance & Quality Measures: Aspirin  prescribed? - Yes ADP Receptor Inhibitor (Plavix /Clopidogrel , Brilinta /Ticagrelor  or Effient/Prasugrel) prescribed (includes medically  managed patients)? - Yes High Intensity Statin (Lipitor 40-80mg  or Crestor 20-40mg ) prescribed? - Yes For EF <40%, was ACEI/ARB prescribed? - Not Applicable (EF >/= 40%) For EF <40%, Aldosterone Antagonist (Spironolactone or Eplerenone) prescribed? - Not Applicable (EF >/= 40%) Cardiac Rehab Phase II ordered? - Yes    _____________  Discharge Vitals Blood pressure 99/67, pulse 68, temperature 98.3 F (36.8 C), temperature source Oral, resp. rate 16, height 5' 3 (1.6 m), weight 86.6 kg, SpO2 97%.  Filed Weights   10/13/24 0921  Weight: 86.6 kg    Labs & Radiologic Studies  CBC Recent Labs    10/14/24 0212  WBC 6.7  HGB 12.5  HCT 38.8  MCV 86.2  PLT 290   Basic Metabolic Panel Recent Labs    98/76/73 0212  NA 141  K 4.3  CL 107  CO2 25  GLUCOSE 87  BUN 18  CREATININE 1.01*  CALCIUM  9.0   Liver Function Tests No results for input(s): AST, ALT, ALKPHOS, BILITOT, PROT, ALBUMIN in the last 72 hours. No results for input(s): LIPASE, AMYLASE in the last 72 hours. High Sensitivity Troponin:   No results for input(s): TROPONINIHS in the last 720 hours.  No results for input(s): TRNPT in the last 720 hours.  BNP Invalid input(s): POCBNP No results for input(s): PROBNP in the last 72 hours.  No results for input(s): BNP in the last 72 hours.  D-Dimer No results for input(s): DDIMER in the last 72 hours. Hemoglobin A1C No results for input(s): HGBA1C in the last 72 hours. Fasting Lipid Panel No results for input(s): CHOL, HDL, LDLCALC, TRIG, CHOLHDL, LDLDIRECT in the last 72 hours. No results found for: LIPOA  Thyroid Function Tests No results for input(s): TSH, T4TOTAL, T3FREE, THYROIDAB in the last 72 hours.  Invalid input(s): FREET3 _____________  CARDIAC CATHETERIZATION Result Date: 10/13/2024   Mid LAD lesion is 90% stenosed.   Prox LAD lesion is 60% stenosed.   Mid LAD to Dist LAD lesion is 40% stenosed.   A drug-eluting stent was successfully placed using a STENT ONYX FRONTIER [id number].   A drug-eluting stent was successfully placed using a STENT ONYX FRONTIER 3.0X12.   Post intervention, there is a 0% residual stenosis.   Post intervention, there is a 0% residual stenosis.   In the absence of any other complications or  medical issues, we expect the patient to be ready for discharge from an interventional cardiology perspective on 10/14/2024.   Recommend dual antiplatelet therapy with Aspirin  81mg  daily and Clopidogrel  75mg  daily. Successful OCT guided orbital atherectomy and drug-eluting stent placement to the mid LAD.  In addition, intravascular lithotripsy was performed due to significant disease proximally with deep calcifications noted by OCT.  Another overlapping stent was placed proximally. Recommendations: She can likely be discharged home tomorrow.  Recommend indefinite dual antiplatelet therapy as tolerated. She is to follow-up with her cardiologist Dr. Fernand in 1 to 2 weeks.   CARDIAC CATHETERIZATION Result Date: 09/28/2024   Prox RCA lesion is 35% stenosed.   Mid RCA lesion is 55% stenosed.   Mid LAD lesion is 90% stenosed.   There is no aortic valve stenosis.   Recommend dual antiplatelet therapy with Aspirin  81mg  daily and Clopidogrel  75mg  daily. Patient has moderate disease in distal RCA and high-grade eccentric calcified lesion in the mid LAD.  Patient may need atherectomy of the mid LAD at [facility].  Went over the films with Dr. Katina and will be set up for  atherectomy next Tuesday as an outpatient.    Disposition Pt is being discharged home today in good condition.  Follow-up Plans & Appointments  Discharge Instructions     Amb Referral to Cardiac Rehabilitation   Complete by: As directed    [facility]   Diagnosis: Coronary Stents   After initial evaluation and assessments completed: Virtual Based Care may be provided alone or in conjunction with Phase 2 Cardiac Rehab based on patient barriers.: Yes   Intensive Cardiac Rehabilitation (ICR) [facility] location only OR Traditional Cardiac Rehabilitation (TCR) *If criteria for ICR are not met will enroll in TCR [facility] only): Yes   Discharge instructions   Complete by: As directed    Radial Site Care Refer to this sheet in the next few weeks. These  instructions provide you with information on caring for yourself after your procedure. Your caregiver may also give you more specific instructions. Your treatment has been planned according to current medical practices, but problems sometimes occur. Call your caregiver if you have any problems or questions after your procedure. HOME CARE INSTRUCTIONS You may shower the day after the procedure. Remove the bandage (dressing) and gently wash the site with plain soap and water . Gently pat the site dry.  Do not apply powder or lotion to the site.  Do not submerge the affected site in water  for 3 to 5 days.  Inspect the site at least twice daily.  Do not flex or bend the affected arm for 24 hours.  No lifting over 5 pounds (2.3 kg) for 5 days after your procedure.  Do not drive home if you are discharged the same day of the procedure. Have someone else drive you.  You may drive 24 hours after the procedure unless otherwise instructed by your caregiver.  What to expect: Any bruising will usually fade within 1 to 2 weeks.  Blood that collects in the tissue (hematoma) may be painful to the touch. It should usually decrease in size and tenderness within 1 to 2 weeks.  SEEK IMMEDIATE MEDICAL CARE IF: You have unusual pain at the radial site.  You have redness, warmth, swelling, or pain at the radial site.  You have drainage (other than a small amount of blood on the dressing).  You have chills.  You have a fever or persistent symptoms for more than 72 hours.  You have a fever and your symptoms suddenly get worse.  Your arm becomes pale, cool, tingly, or numb.  You have heavy bleeding from the site. Hold pressure on the site.   PLEASE DO NOT MISS ANY DOSES OF YOUR PLAVIX !!!!! Also keep a log of you blood pressures and bring back to your follow up appt. Please call the office with any questions.   Patients taking blood thinners should generally stay away from medicines like ibuprofen, Advil, Motrin,  naproxen , and Aleve  due to risk of stomach bleeding. You may take Tylenol  as directed or talk to your primary doctor about alternatives.   PLEASE ENSURE THAT YOU DO NOT RUN OUT OF YOUR PLAVIX . This medication is very important to remain on for at least 6months. IF you have issues obtaining this medication due to cost please CALL the office 3-5 business days prior to running out in order to prevent missing doses of this medication.   Increase activity slowly   Complete by: As directed        Discharge Medications Allergies as of 10/14/2024       Reactions   Hydralazine  Hcl  Swelling, Palpitations        Medication List     TAKE these medications    acetaminophen  500 MG tablet Commonly known as: TYLENOL  Take 2 tablets (1,000 mg total) by mouth every 6 (six) hours as needed.   amLODipine  10 MG tablet Commonly known as: NORVASC  Take 1 tablet (10 mg total) by mouth daily. What changed: when to take this   aspirin  EC 81 MG tablet Take 1 tablet (81 mg total) by mouth daily. Swallow whole.   atorvastatin  80 MG tablet Commonly known as: LIPITOR TAKE 1 TABLET BY MOUTH EVERYDAY AT BEDTIME   buPROPion  150 MG 12 hr tablet Commonly known as: WELLBUTRIN  SR TAKE 1 TABLET BY MOUTH TWICE A DAY   butalbital-acetaminophen -caffeine 50-325-40 MG tablet Commonly known as: FIORICET Take 1 tablet by mouth 2 (two) times daily as needed for headache.   CAL-MAG-ZINC PO Take 1 tablet by mouth daily.   clopidogrel  75 MG tablet Commonly known as: PLAVIX  TAKE 1 TABLET BY MOUTH EVERY DAY **MAKE FOLLOW UP APPT WITH DR. KHAN FOR REFILLS**   cyclobenzaprine  10 MG tablet Commonly known as: FLEXERIL  TAKE 1 TABLET BY MOUTH AT BEDTIME FOR MUSCLE RELAXANT **WILL MAKE DROWSY   isosorbide  mononitrate 30 MG 24 hr tablet Commonly known as: IMDUR  Take 1 tablet (30 mg total) by mouth daily.   levothyroxine  200 MCG tablet Commonly known as: SYNTHROID  Take 1 tablet (200 mcg total) by mouth daily  before breakfast. What changed:  when to take this additional instructions   levothyroxine  50 MCG tablet Commonly known as: Synthroid  Take 1 tablet (50 mcg total) by mouth daily. What changed:  when to take this additional instructions   metoprolol  succinate 25 MG 24 hr tablet Commonly known as: TOPROL -XL TAKE 1 TABLET BY MOUTH EVERY DAY **MAKE FOLLOW UP APPT WITH DR. KHAN FOR REFILLS** What changed: See the new instructions.   multivitamin with minerals tablet Take 1 tablet by mouth daily.   nitroGLYCERIN  0.4 MG SL tablet Commonly known as: NITROSTAT  Place 1 tablet (0.4 mg total) under the tongue every 5 (five) minutes as needed for chest pain.   pantoprazole  40 MG tablet Commonly known as: PROTONIX  Take 1 tablet (40 mg total) by mouth 2 (two) times daily. What changed:  when to take this reasons to take this   ramipril  10 MG capsule Commonly known as: ALTACE  TAKE 1 CAPSULE BY MOUTH EVERY DAY IN THE MORNING   traMADol  50 MG tablet Commonly known as: Ultram  Take 1 tablet (50 mg total) by mouth 3 (three) times daily as needed for up to 7 days. What changed: when to take this         Outstanding Labs/Studies  N/a   Duration of Discharge Encounter: APP Time: 15 minutes   Signed, Manuelita Rummer, NP 10/14/2024, 11:45 AM   ATTENDING ATTESTATION:  After conducting a review of all available clinical information with the care team, interviewing the patient, and performing a physical exam, I agree with the findings and plan described in this note with adjustments as indicated below which were discussed and enacted by staff above.   GEN: No acute distress, AO x 3 HEENT:  MMM, no JVD, no scleral icterus Cardiac: RRR, no murmurs, rubs, or gallops.  Respiratory: Clear to auscultation bilaterally. GI: Soft, nontender, non-distended  MS: No edema; No deformity. Neuro:  Nonfocal  Vasc:  R radial CDI  Patient is doing well after complex PCI of mid LAD with  atherectomy and OCT guidance with  drug-eluting stent implantation.  Her access site is stable.  She had some atypical chest pain which was fleeting this morning.  I suspect this relates to vessel stretch.  Will discharge today on dual antiplatelet therapy with aspirin  and Plavix .  I reviewed the need for dual antiplatelet therapy and medical therapy for coronary artery disease.  Patient to follow-up with primary cardiologist.  APP discharge time:15 MD discharge time:20  Lurena Red, MD Pager [phone]   "

## 2024-10-14 NOTE — Progress Notes (Addendum)
 CARDIAC REHAB PHASE I     Post stent education including site care, restrictions, risk factors, exercise guidelines, NTG use, antiplatelet therapy importance, heart healthy diet, and CRP2 reviewed. Patient states she has been independently ambulating post procedure without difficulties. All questions and concerns addressed. Will refer to [facility] for CRP2.    [id number] Christina JAYSON [place], RN BSN 10/14/2024 9:39 AM

## 2024-10-18 ENCOUNTER — Telehealth (HOSPITAL_COMMUNITY): Payer: Self-pay

## 2024-10-18 ENCOUNTER — Ambulatory Visit: Payer: Self-pay | Admitting: Cardiovascular Disease

## 2024-10-18 LAB — LIPOPROTEIN A (LPA): Lipoprotein (a): 397.5 nmol/L — ABNORMAL HIGH

## 2024-10-18 NOTE — Telephone Encounter (Signed)
 Phase 1 Referral received and faxed to [facility].  Closed referral

## 2024-10-21 ENCOUNTER — Ambulatory Visit (INDEPENDENT_AMBULATORY_CARE_PROVIDER_SITE_OTHER): Admitting: Cardiovascular Disease

## 2024-10-21 ENCOUNTER — Encounter: Payer: Self-pay | Admitting: Cardiovascular Disease

## 2024-10-21 VITALS — BP 130/70 | HR 85 | Ht 63.0 in | Wt 191.0 lb

## 2024-10-21 DIAGNOSIS — R0789 Other chest pain: Secondary | ICD-10-CM

## 2024-10-21 DIAGNOSIS — I340 Nonrheumatic mitral (valve) insufficiency: Secondary | ICD-10-CM

## 2024-10-21 DIAGNOSIS — I10 Essential (primary) hypertension: Secondary | ICD-10-CM

## 2024-10-21 DIAGNOSIS — I25112 Atherosclerotic heart disease of native coronary artery with refractory angina pectoris: Secondary | ICD-10-CM

## 2024-10-21 DIAGNOSIS — R0602 Shortness of breath: Secondary | ICD-10-CM | POA: Diagnosis not present

## 2024-10-21 DIAGNOSIS — G4733 Obstructive sleep apnea (adult) (pediatric): Secondary | ICD-10-CM

## 2024-10-21 DIAGNOSIS — I25118 Atherosclerotic heart disease of native coronary artery with other forms of angina pectoris: Secondary | ICD-10-CM | POA: Diagnosis not present

## 2024-10-21 DIAGNOSIS — I2510 Atherosclerotic heart disease of native coronary artery without angina pectoris: Secondary | ICD-10-CM

## 2024-10-21 NOTE — Progress Notes (Signed)
 "     Cardiology Office Note   Date:  10/21/2024   ID:  Christina Zamora, DOB 01-20-1961, MRN [id number]  PCP:  [facility]  Cardiologist:  Denyse Bathe, MD      History of Present Illness: Christina Zamora is a 64 y.o. female who presents for  Chief Complaint  Patient presents with   Follow-up    3 week follow-up     Had PCI/ATHERCTOMY.      Past Medical History:  Diagnosis Date   Arthritis    CAD (coronary artery disease)    Chronic headaches    GERD (gastroesophageal reflux disease)    Hyperlipidemia    Hypertension    Insomnia    Lumbago    Pulmonary HTN (HCC)    Thyroid disease    Vitamin D deficiency      Past Surgical History:  Procedure Laterality Date   BILATERAL CARPAL TUNNEL RELEASE  2000   BREAST EXCISIONAL BIOPSY Right 06/24/2010   surgical bx fat necrosis   CORONARY ANGIOGRAPHY N/A 05/29/2017   Procedure: CORONARY ANGIOGRAPHY;  Surgeon: Bathe Denyse DELENA, MD;  Location: [facility];  Service: Cardiovascular;  Laterality: N/A;   CORONARY ANGIOPLASTY WITH STENT PLACEMENT     CORONARY ATHERECTOMY N/A 10/13/2024   Procedure: CORONARY ATHERECTOMY;  Surgeon: Darron Deatrice DELENA, MD;  Location: [facility];  Service: Cardiovascular;  Laterality: N/A;   CORONARY IMAGING/OCT N/A 10/13/2024   Procedure: CORONARY IMAGING/OCT;  Surgeon: Darron Deatrice DELENA, MD;  Location: [facility];  Service: Cardiovascular;  Laterality: N/A;   CORONARY LITHOTRIPSY N/A 10/13/2024   Procedure: CORONARY LITHOTRIPSY;  Surgeon: Darron Deatrice DELENA, MD;  Location: [facility];  Service: Cardiovascular;  Laterality: N/A;   LEFT HEART CATH Left 05/29/2017   Procedure: Left Heart Cath;  Surgeon: Bathe Denyse DELENA, MD;  Location: [facility];  Service: Cardiovascular;  Laterality: Left;   LEFT HEART CATH AND CORONARY ANGIOGRAPHY Left 01/27/2020   Procedure: LEFT HEART CATH AND CORONARY ANGIOGRAPHY;  Surgeon: Bathe Denyse DELENA, MD;  Location: [facility]  INVASIVE CV [facility];  Service: Cardiovascular;  Laterality: Left;   LEFT HEART CATH AND CORONARY ANGIOGRAPHY Right 09/27/2024   Procedure: LEFT HEART CATH AND CORONARY ANGIOGRAPHY with possible coronary intervention;  Surgeon: Bathe Denyse DELENA, MD;  Location: [facility];  Service: Cardiovascular;  Laterality: Right;   REPLACEMENT TOTAL KNEE Left 2015     Current Outpatient Medications  Medication Sig Dispense Refill   acetaminophen  (TYLENOL ) 500 MG tablet Take 2 tablets (1,000 mg total) by mouth every 6 (six) hours as needed. 100 tablet 2   amLODipine  (NORVASC ) 10 MG tablet Take 1 tablet (10 mg total) by mouth daily. 90 tablet 1   aspirin  EC 81 MG tablet Take 1 tablet (81 mg total) by mouth daily. Swallow whole. 90 tablet 1   atorvastatin  (LIPITOR) 80 MG tablet TAKE 1 TABLET BY MOUTH EVERYDAY AT BEDTIME 90 tablet 0   buPROPion  (WELLBUTRIN  SR) 150 MG 12 hr tablet TAKE 1 TABLET BY MOUTH TWICE A DAY 180 tablet 1   butalbital-acetaminophen -caffeine (FIORICET) 50-325-40 MG tablet Take 1 tablet by mouth 2 (two) times daily as needed for headache.     Calcium -Magnesium-Zinc (CAL-MAG-ZINC PO) Take 1 tablet by mouth daily.     clopidogrel  (PLAVIX ) 75 MG tablet TAKE 1 TABLET BY MOUTH EVERY DAY **MAKE FOLLOW UP APPT WITH DR. Omar Orrego FOR REFILLS** 90 tablet 2   cyclobenzaprine  (FLEXERIL ) 10  MG tablet TAKE 1 TABLET BY MOUTH AT BEDTIME FOR MUSCLE RELAXANT **WILL MAKE DROWSY 30 tablet 3   Multiple Vitamins-Minerals (MULTIVITAMIN WITH MINERALS) tablet Take 1 tablet by mouth daily.     nitroGLYCERIN  (NITROSTAT ) 0.4 MG SL tablet Place 1 tablet (0.4 mg total) under the tongue every 5 (five) minutes as needed for chest pain. 10 tablet 0   ramipril  (ALTACE ) 10 MG capsule TAKE 1 CAPSULE BY MOUTH EVERY DAY IN THE MORNING 90 capsule 0   levothyroxine  (SYNTHROID ) 200 MCG tablet Take 1 tablet (200 mcg total) by mouth daily before breakfast. (Patient taking differently: Take 200 mcg by mouth See admin instructions. Take  with 50 mcg for a total of 250 mcg with in the morning) 90 tablet 0   levothyroxine  (SYNTHROID ) 50 MCG tablet Take 1 tablet (50 mcg total) by mouth daily. (Patient taking differently: Take 50 mcg by mouth See admin instructions. Take with 200 mcg for a total of 250 mcg in the morning) 30 tablet 11   metoprolol  succinate (TOPROL -XL) 25 MG 24 hr tablet TAKE 1 TABLET BY MOUTH EVERY DAY **MAKE FOLLOW UP APPT WITH DR. Taejah Ohalloran FOR REFILLS** (Patient taking differently: 25 mg at bedtime.) 90 tablet 4   pantoprazole  (PROTONIX ) 40 MG tablet Take 1 tablet (40 mg total) by mouth 2 (two) times daily. (Patient taking differently: Take 40 mg by mouth 2 (two) times daily as needed (Heartburn).) 180 tablet 1   traMADol  (ULTRAM ) 50 MG tablet Take 1 tablet (50 mg total) by mouth 3 (three) times daily as needed for up to 7 days. (Patient taking differently: Take 50 mg by mouth 3 (three) times daily.) 21 tablet 0   Current Facility-Administered Medications  Medication Dose Route Frequency Provider Last Rate Last Admin   lactated ringers  infusion 1,000 mL  1,000 mL Intravenous Continuous Myra Lynwood MATSU, MD       Facility-Administered Medications Ordered in Other Visits  Medication Dose Route Frequency Provider Last Rate Last Admin   sodium chloride  flush (NS) 0.9 % injection 3 mL  3 mL Intravenous Q12H Fernand Denyse LABOR, MD        Allergies:   Hydralazine  hcl    Social History:   reports that she quit smoking about 24 years ago. Her smoking use included cigarettes. She started smoking about 44 years ago. She has a 10 pack-year smoking history. She has never used smokeless tobacco. She reports that she does not drink alcohol and does not use drugs.   Family History:  family history includes Diabetes in her mother; Heart attack in her father; Heart failure in her mother; Hyperlipidemia in her mother.    ROS:     Review of Systems  Constitutional: Negative.   HENT: Negative.    Eyes: Negative.   Respiratory:  Negative.    Gastrointestinal: Negative.   Genitourinary: Negative.   Musculoskeletal: Negative.   Skin: Negative.   Neurological: Negative.   Endo/Heme/Allergies: Negative.   Psychiatric/Behavioral: Negative.    All other systems reviewed and are negative.     All other systems are reviewed and negative.    PHYSICAL EXAM: VS:  BP 130/70   Pulse 85   Ht 5' 3 (1.6 m)   Wt 191 lb (86.6 kg)   SpO2 96%   BMI 33.83 kg/m  , BMI Body mass index is 33.83 kg/m. Last weight:  Wt Readings from Last 3 Encounters:  10/21/24 191 lb (86.6 kg)  10/13/24 191 lb (86.6 kg)  10/04/24 191 lb  11.2 oz (87 kg)     Physical Exam Constitutional:      Appearance: Normal appearance.  Cardiovascular:     Rate and Rhythm: Normal rate and regular rhythm.     Heart sounds: Normal heart sounds.  Pulmonary:     Effort: Pulmonary effort is normal.     Breath sounds: Normal breath sounds.  Musculoskeletal:     Right lower leg: No edema.     Left lower leg: No edema.  Neurological:     Mental Status: She is alert.       EKG:   Recent Labs: 06/02/2024: TSH 5.800 09/02/2024: ALT 26 10/14/2024: BUN 18; Creatinine, Ser 1.01; Hemoglobin 12.5; Platelets 290; Potassium 4.3; Sodium 141    Lipid Panel    Component Value Date/Time   CHOL 165 06/02/2024 1122   CHOL 222 (H) 03/31/2013 0444   TRIG 210 (H) 06/02/2024 1122   TRIG 198 03/31/2013 0444   HDL 44 06/02/2024 1122   HDL 45 03/31/2013 0444   CHOLHDL 3.8 06/02/2024 1122   CHOLHDL 4.1 08/07/2009 0425   VLDL 40 03/31/2013 0444   LDLCALC 86 06/02/2024 1122   LDLCALC 137 (H) 03/31/2013 0444      Other studies Reviewed: Additional studies/ records that were reviewed today include:  Review of the above records demonstrates:       No data to display            ASSESSMENT AND PLAN:    ICD-10-CM   1. Other chest pain  R07.89    HAD PCI/ATHERCTOMY    2. Primary hypertension  I10     3. SOB (shortness of breath)  R06.02      4. Nonrheumatic mitral valve regurgitation  I34.0     5. Coronary artery disease of native artery of native heart with stable angina pectoris  I25.118     6. Coronary artery disease involving native coronary artery of native heart with refractory angina pectoris  I25.112    DOING BETTER AFTYER PCI, NO CHEST PAIN , NEED REHAB.    7. Atherosclerosis of native coronary artery of native heart without angina pectoris  I25.10     8. OSA on CPAP  G47.33        Problem List Items Addressed This Visit       Cardiovascular and Mediastinum   CAD (coronary artery disease)   Nonrheumatic mitral valve regurgitation   Primary hypertension     Other   Chest pain - Primary   SOB (shortness of breath)   Other Visit Diagnoses       Atherosclerosis of native coronary artery of native heart without angina pectoris         OSA on CPAP              Disposition:   Return in about 2 months (around 12/19/2024).    Total time spent: 40 minutes  Signed,  Denyse Bathe, MD  10/21/2024 9:44 AM    [facility] "

## 2024-10-22 ENCOUNTER — Other Ambulatory Visit: Payer: Self-pay | Admitting: Internal Medicine

## 2024-10-22 DIAGNOSIS — M5430 Sciatica, unspecified side: Secondary | ICD-10-CM

## 2024-10-26 ENCOUNTER — Other Ambulatory Visit: Payer: Self-pay | Admitting: Internal Medicine

## 2024-10-26 DIAGNOSIS — I2510 Atherosclerotic heart disease of native coronary artery without angina pectoris: Secondary | ICD-10-CM

## 2024-10-26 DIAGNOSIS — E039 Hypothyroidism, unspecified: Secondary | ICD-10-CM

## 2025-01-05 ENCOUNTER — Ambulatory Visit: Admitting: Cardiovascular Disease
